# Patient Record
Sex: Female | Born: 1945 | Race: Black or African American | Hispanic: No | Smoking: Never smoker
Health system: Southern US, Community
[De-identification: ages and names within clinical notes are randomized; demographics above are authoritative.]

## PROBLEM LIST (undated history)

## (undated) DIAGNOSIS — R232 Flushing: Secondary | ICD-10-CM

## (undated) DIAGNOSIS — G5600 Carpal tunnel syndrome, unspecified upper limb: Secondary | ICD-10-CM

## (undated) DIAGNOSIS — K219 Gastro-esophageal reflux disease without esophagitis: Secondary | ICD-10-CM

## (undated) DIAGNOSIS — E119 Type 2 diabetes mellitus without complications: Secondary | ICD-10-CM

## (undated) DIAGNOSIS — C50919 Malignant neoplasm of unspecified site of unspecified female breast: Secondary | ICD-10-CM

## (undated) DIAGNOSIS — M1990 Unspecified osteoarthritis, unspecified site: Secondary | ICD-10-CM

## (undated) DIAGNOSIS — C50411 Malignant neoplasm of upper-outer quadrant of right female breast: Principal | ICD-10-CM

## (undated) HISTORY — DX: Flushing: R23.2

## (undated) HISTORY — PX: TONSILLECTOMY: SUR1361

## (undated) HISTORY — PX: EYE SURGERY: SHX253

## (undated) HISTORY — PX: ABDOMINAL HYSTERECTOMY: SHX81

## (undated) HISTORY — PX: JOINT REPLACEMENT: SHX530

## (undated) HISTORY — PX: SHOULDER SURGERY: SHX246

## (undated) HISTORY — DX: Malignant neoplasm of upper-outer quadrant of right female breast: C50.411

## (undated) HISTORY — PX: ESOPHAGOGASTRODUODENOSCOPY: SHX1529

## (undated) HISTORY — PX: APPENDECTOMY: SHX54

## (undated) HISTORY — PX: COLONOSCOPY: SHX174

---

## 2004-05-09 ENCOUNTER — Other Ambulatory Visit: Admission: RE | Admit: 2004-05-09 | Discharge: 2004-05-09 | Payer: Self-pay | Admitting: Family Medicine

## 2007-03-11 ENCOUNTER — Ambulatory Visit: Payer: Self-pay | Admitting: Pulmonary Disease

## 2007-03-11 DIAGNOSIS — E1165 Type 2 diabetes mellitus with hyperglycemia: Secondary | ICD-10-CM

## 2007-03-11 DIAGNOSIS — R05 Cough: Secondary | ICD-10-CM

## 2007-03-11 DIAGNOSIS — R059 Cough, unspecified: Secondary | ICD-10-CM | POA: Insufficient documentation

## 2007-03-27 ENCOUNTER — Telehealth (INDEPENDENT_AMBULATORY_CARE_PROVIDER_SITE_OTHER): Payer: Self-pay | Admitting: *Deleted

## 2007-04-02 ENCOUNTER — Ambulatory Visit: Payer: Self-pay | Admitting: Pulmonary Disease

## 2007-05-29 ENCOUNTER — Ambulatory Visit: Payer: Self-pay | Admitting: Pulmonary Disease

## 2007-10-23 ENCOUNTER — Emergency Department (HOSPITAL_COMMUNITY): Admission: EM | Admit: 2007-10-23 | Discharge: 2007-10-23 | Payer: Self-pay | Admitting: Emergency Medicine

## 2008-07-12 ENCOUNTER — Emergency Department (HOSPITAL_COMMUNITY): Admission: EM | Admit: 2008-07-12 | Discharge: 2008-07-12 | Payer: Self-pay | Admitting: Emergency Medicine

## 2008-09-19 ENCOUNTER — Emergency Department (HOSPITAL_COMMUNITY): Admission: EM | Admit: 2008-09-19 | Discharge: 2008-09-19 | Payer: Self-pay | Admitting: Emergency Medicine

## 2009-01-10 ENCOUNTER — Emergency Department (HOSPITAL_COMMUNITY): Admission: EM | Admit: 2009-01-10 | Discharge: 2009-01-11 | Payer: Self-pay | Admitting: Emergency Medicine

## 2009-10-24 ENCOUNTER — Emergency Department (HOSPITAL_COMMUNITY)
Admission: EM | Admit: 2009-10-24 | Discharge: 2009-10-24 | Payer: Self-pay | Source: Home / Self Care | Admitting: Family Medicine

## 2010-05-10 LAB — POCT I-STAT, CHEM 8
BUN: 11 mg/dL (ref 6–23)
Calcium, Ion: 1.09 mmol/L — ABNORMAL LOW (ref 1.12–1.32)
Creatinine, Ser: 0.8 mg/dL (ref 0.4–1.2)
Glucose, Bld: 158 mg/dL — ABNORMAL HIGH (ref 70–99)
Hemoglobin: 12.6 g/dL (ref 12.0–15.0)
Sodium: 138 mEq/L (ref 135–145)
TCO2: 27 mmol/L (ref 0–100)

## 2010-05-10 LAB — POCT CARDIAC MARKERS: Myoglobin, poc: 58.7 ng/mL (ref 12–200)

## 2010-05-10 LAB — CBC
HCT: 34.7 % — ABNORMAL LOW (ref 36.0–46.0)
MCHC: 34.2 g/dL (ref 30.0–36.0)
MCV: 93.8 fL (ref 78.0–100.0)
RBC: 3.7 MIL/uL — ABNORMAL LOW (ref 3.87–5.11)
WBC: 7.9 10*3/uL (ref 4.0–10.5)

## 2010-05-10 LAB — DIFFERENTIAL
Basophils Relative: 0 % (ref 0–1)
Eosinophils Absolute: 0.2 10*3/uL (ref 0.0–0.7)
Eosinophils Relative: 2 % (ref 0–5)
Lymphs Abs: 2.2 10*3/uL (ref 0.7–4.0)
Monocytes Absolute: 0.5 10*3/uL (ref 0.1–1.0)
Monocytes Relative: 6 % (ref 3–12)

## 2010-06-21 ENCOUNTER — Encounter (HOSPITAL_BASED_OUTPATIENT_CLINIC_OR_DEPARTMENT_OTHER)
Admission: RE | Admit: 2010-06-21 | Discharge: 2010-06-21 | Disposition: A | Discharge status: Home / Self Care | Payer: Medicare Other | Source: Ambulatory Visit | Attending: Orthopedic Surgery | Admitting: Orthopedic Surgery

## 2010-06-21 ENCOUNTER — Other Ambulatory Visit: Payer: Self-pay | Admitting: Orthopedic Surgery

## 2010-06-21 ENCOUNTER — Ambulatory Visit
Admission: RE | Admit: 2010-06-21 | Discharge: 2010-06-21 | Disposition: A | Discharge status: Home / Self Care | Payer: Medicare Other | Source: Ambulatory Visit | Attending: Orthopedic Surgery | Admitting: Orthopedic Surgery

## 2010-06-21 DIAGNOSIS — Z01811 Encounter for preprocedural respiratory examination: Secondary | ICD-10-CM

## 2010-06-21 LAB — BASIC METABOLIC PANEL
CO2: 29 mEq/L (ref 19–32)
Calcium: 10.1 mg/dL (ref 8.4–10.5)
Potassium: 3.7 mEq/L (ref 3.5–5.1)
Sodium: 140 mEq/L (ref 135–145)

## 2010-06-22 ENCOUNTER — Ambulatory Visit (HOSPITAL_BASED_OUTPATIENT_CLINIC_OR_DEPARTMENT_OTHER)
Admission: RE | Admit: 2010-06-22 | Discharge: 2010-06-22 | Disposition: A | Discharge status: Home / Self Care | Payer: Medicare Other | Source: Ambulatory Visit | Attending: Orthopedic Surgery | Admitting: Orthopedic Surgery

## 2010-06-22 DIAGNOSIS — M67919 Unspecified disorder of synovium and tendon, unspecified shoulder: Secondary | ICD-10-CM | POA: Insufficient documentation

## 2010-06-22 DIAGNOSIS — M719 Bursopathy, unspecified: Secondary | ICD-10-CM | POA: Insufficient documentation

## 2010-06-22 DIAGNOSIS — M19019 Primary osteoarthritis, unspecified shoulder: Secondary | ICD-10-CM | POA: Insufficient documentation

## 2010-06-22 DIAGNOSIS — M25819 Other specified joint disorders, unspecified shoulder: Secondary | ICD-10-CM | POA: Insufficient documentation

## 2010-06-22 DIAGNOSIS — M66329 Spontaneous rupture of flexor tendons, unspecified upper arm: Secondary | ICD-10-CM | POA: Insufficient documentation

## 2010-06-22 LAB — GLUCOSE, CAPILLARY
Glucose-Capillary: 124 mg/dL — ABNORMAL HIGH (ref 70–99)
Glucose-Capillary: 130 mg/dL — ABNORMAL HIGH (ref 70–99)

## 2010-06-22 LAB — POCT HEMOGLOBIN-HEMACUE: Hemoglobin: 12.7 g/dL (ref 12.0–15.0)

## 2010-06-28 NOTE — Op Note (Signed)
NAME:  Amanda [name], Amanda [name]                ACCOUNT NO.:  [id number]  MEDICAL RECORD NO.:  [id number]           PATIENT TYPE:  LOCATION:                                 FACILITY:  PHYSICIAN:  Harvie Junior, M.D.        DATE OF BIRTH:  DATE OF PROCEDURE:  06/22/2010 DATE OF DISCHARGE:                              OPERATIVE REPORT   PREOPERATIVE DIAGNOSES: 1. Impingement. 2. Acromioclavicular joint arthritis. 3. Question rotator cuff tear.  POSTOPERATIVE DIAGNOSES: 1. Impingement. 2. Acromioclavicular joint arthritis. 3. Biceps tendon rupture. 4. Rotator cuff tear.  PROCEDURE: 1. Arthroscopic rotator cuff repair with a FiberTape and a 5.5     SwiveLock anchor. 2. Arthroscopic acromioplasty from the lateral and posterior     compartment. 3. Arthroscopic distal clavicle resection over 20 mm. 4. Arthroscopic debridement of superior labral tear with biceps tendon     stump debridement within the glenohumeral joint as well as a     debridement of chondral fragments in the glenohumeral joint.  SURGEON:  Harvie Junior, MD  ASSISTANT:  Marshia Ly, PA  ANESTHESIA:  General.  BRIEF HISTORY:  Amanda [name] is a 65 year old female with a history of having had significant bilateral shoulder pain, right greater than left. We treated conservatively for a long period of time.  MRI was obtained which showed she had a split-thickness biceps tendon tear with significant impingement and AC joint arthritis.  We injected her a couple of times, she had good relief, talked about treatment alternatives.  We felt that most appropriate course of action for her was going to be arthroscopic subacromial decompression, distal clavicle resection, evaluation of cuff as needed.  In the interim between her MRI and her surgery, she obviously had ruptured her biceps tendon.  She was brought to the operating room for this procedure.  PROCEDURE:  The patient was brought to the operating room.  After adequate  anesthesia was obtained with general anesthetic, the patient was placed supine on the operating table.  The right shoulder was then prepped and draped in usual sterile fashion.  After she had been moved into the beach-chair position, all bony prominences had been well padded.  At this point, routine arthroscopic lamination revealed the biceps tendon had obviously been ruptured within the glenohumeral joint. The stump was visible as well as superior labral tear which was debrided back to a smooth and stable rim.  Glenohumeral joint on the anterior- inferior area had some unstable cartilage which was debrided.  The rotator cuff had an obvious frank tear in the supraspinatus tendon from the undersurface.  Once this was debrided, we sort of shaved out of this so we could find it easily from the top side.  At this point, attention was turned out of the glenohumeral joint and the subacromial space. Anterolateral acromioplasty was performed from the lateral and posterior compartment.  The distal clavicle resection was done over 20 mm from the anterior compartment.  Attention was then turned to the rotator cuff lateral side where it was clearly identified.  We put the shaver in put and put the camera  into the lateral side to define the anterior and posterior extents.  At this point, we felt that a single FiberTape would be appropriate for repair, and at this point a FiberTape anchor was passed and this was passed from the lateral cannula, taken out the front and shuttled back to the back, and then the central point between the sutures was identified and this was marked as the place for the [facility] anchor.  Once this was completed, the punch was used for the SwiveLock, and the SwiveLock was then tied lateralizing the cuff repair and bringing right down to the bone edge.  A beautiful repair of the rotator cuff was obtained with no bunching or bundling.  At this point, the wound was copiously and  thoroughly lavaged and suctioned dry.  All remaining bone fragments were removed with the suction shaver.  The camera was moved back laterally to check the rotator cuff repair and a very nice repair had been achieved from the lateral compartment.  At this point, the wound was copiously and thoroughly lavaged and suctioned dry.  The arthroscopic portals were closed with some interrupted 4-0 nylon stitches, and a sterile compressive dressing was applied.  The patient was taken to recovery room where she was noted to be in satisfactory condition.  Estimated blood loss for the procedure was none.     Harvie Junior, M.D.     Ranae Plumber  D:  06/22/2010  T:  06/22/2010  Job:  [id number]  Electronically Signed by Jodi Geralds M.D. on 06/28/2010 05:44:01 PM

## 2012-06-12 ENCOUNTER — Ambulatory Visit
Admission: RE | Admit: 2012-06-12 | Discharge: 2012-06-12 | Disposition: A | Discharge status: Home / Self Care | Payer: Medicare Other | Source: Ambulatory Visit | Attending: Family Medicine | Admitting: Family Medicine

## 2012-06-12 ENCOUNTER — Other Ambulatory Visit: Payer: Self-pay | Admitting: Family Medicine

## 2012-06-12 DIAGNOSIS — M25562 Pain in left knee: Secondary | ICD-10-CM

## 2012-06-12 DIAGNOSIS — M25561 Pain in right knee: Secondary | ICD-10-CM

## 2012-09-24 ENCOUNTER — Other Ambulatory Visit: Payer: Self-pay | Admitting: *Deleted

## 2012-09-24 MED ORDER — LIRAGLUTIDE 18 MG/3ML ~~LOC~~ SOPN: 1.8000 mg | PEN_INJECTOR | Freq: Every day | SUBCUTANEOUS | Status: DC

## 2012-10-08 ENCOUNTER — Telehealth: Payer: Self-pay | Admitting: Endocrinology

## 2012-10-08 MED ORDER — GLIPIZIDE 5 MG PO TABS: ORAL_TABLET | ORAL | Status: DC

## 2012-10-08 NOTE — Telephone Encounter (Signed)
rx sent, pt is aware 

## 2012-10-09 ENCOUNTER — Other Ambulatory Visit: Payer: Self-pay | Admitting: *Deleted

## 2012-10-09 MED ORDER — GLIPIZIDE ER 2.5 MG PO TB24: 2.5000 mg | ORAL_TABLET | Freq: Every day | ORAL | Status: DC

## 2012-10-10 ENCOUNTER — Telehealth: Payer: Self-pay | Admitting: Endocrinology

## 2012-10-10 MED ORDER — GLIPIZIDE ER 2.5 MG PO TB24: 2.5000 mg | ORAL_TABLET | Freq: Every day | ORAL | Status: DC

## 2012-10-10 NOTE — Telephone Encounter (Signed)
rx sent

## 2012-11-05 ENCOUNTER — Other Ambulatory Visit: Payer: Self-pay | Admitting: *Deleted

## 2012-11-05 DIAGNOSIS — E119 Type 2 diabetes mellitus without complications: Secondary | ICD-10-CM

## 2012-11-06 ENCOUNTER — Other Ambulatory Visit: Payer: Self-pay | Admitting: Endocrinology

## 2012-11-06 DIAGNOSIS — E785 Hyperlipidemia, unspecified: Secondary | ICD-10-CM

## 2012-11-11 ENCOUNTER — Other Ambulatory Visit: Payer: Medicare Other

## 2012-11-13 ENCOUNTER — Ambulatory Visit: Payer: Medicare Other | Admitting: Endocrinology

## 2012-11-19 ENCOUNTER — Other Ambulatory Visit (INDEPENDENT_AMBULATORY_CARE_PROVIDER_SITE_OTHER): Payer: Medicare Other

## 2012-11-19 DIAGNOSIS — E785 Hyperlipidemia, unspecified: Secondary | ICD-10-CM

## 2012-11-19 DIAGNOSIS — E119 Type 2 diabetes mellitus without complications: Secondary | ICD-10-CM

## 2012-11-19 LAB — LIPID PANEL
Cholesterol: 295 mg/dL — ABNORMAL HIGH (ref 0–200)
HDL: 63.4 mg/dL (ref >39.00)
VLDL: 27.6 mg/dL (ref 0.0–40.0)

## 2012-11-19 LAB — COMPREHENSIVE METABOLIC PANEL
ALT: 11 U/L (ref 0–35)
Alkaline Phosphatase: 43 U/L (ref 39–117)
CO2: 29 mEq/L (ref 19–32)
Creatinine, Ser: 0.7 mg/dL (ref 0.4–1.2)
GFR: 102.13 mL/min (ref >60.00)
Glucose, Bld: 113 mg/dL — ABNORMAL HIGH (ref 70–99)
Total Bilirubin: 0.5 mg/dL (ref 0.3–1.2)

## 2012-11-19 LAB — HEMOGLOBIN A1C: Hgb A1c MFr Bld: 6.7 % — ABNORMAL HIGH (ref 4.6–6.5)

## 2012-11-22 ENCOUNTER — Ambulatory Visit (INDEPENDENT_AMBULATORY_CARE_PROVIDER_SITE_OTHER): Payer: Medicare Other | Admitting: Endocrinology

## 2012-11-22 ENCOUNTER — Encounter: Payer: Self-pay | Admitting: Endocrinology

## 2012-11-22 VITALS — BP 130/62 | HR 76 | Temp 98.5°F | Resp 12 | Ht 62.0 in | Wt 195.6 lb

## 2012-11-22 DIAGNOSIS — R609 Edema, unspecified: Secondary | ICD-10-CM

## 2012-11-22 DIAGNOSIS — M1710 Unilateral primary osteoarthritis, unspecified knee: Secondary | ICD-10-CM

## 2012-11-22 DIAGNOSIS — M170 Bilateral primary osteoarthritis of knee: Secondary | ICD-10-CM

## 2012-11-22 DIAGNOSIS — IMO0002 Reserved for concepts with insufficient information to code with codable children: Secondary | ICD-10-CM

## 2012-11-22 DIAGNOSIS — R600 Localized edema: Secondary | ICD-10-CM

## 2012-11-22 DIAGNOSIS — IMO0001 Reserved for inherently not codable concepts without codable children: Secondary | ICD-10-CM

## 2012-11-22 DIAGNOSIS — E785 Hyperlipidemia, unspecified: Secondary | ICD-10-CM

## 2012-11-22 NOTE — Progress Notes (Signed)
Amanda [name] is an 67 y.o. female.   Reason for Appointment: Diabetes follow-up   History of Present Illness   Diagnosis: Type 2 DIABETES MELITUS     Previous history: She has had difficulty with blood sugar control initially because of limited tolerability with metformin and difficulty with weight loss. She did very well with starting Victoza which has helped her blood sugar control as well as some improvement in her obesity  Recent history:  Blood glucose control is overall fairly good with controlled A1c She is tolerating Victoza and is also taking low-dose glipizide without any hypoglycemia She is however checking blood sugars mostly in the mornings which seem to be fairly close to normal She can do better with weight loss with exercise but unable to do much because of osteoarthritis of knees She does not want to do water aerobics because of pain     Oral hypoglycemic drugs: Glipizide ER 2.5       Side effects from medications: Diarrhea from metformin        Monitors blood glucose: Once a day.    Glucometer:  Accu-Chek Aviva         Blood Glucose readings from meter download: readings before breakfast: 99-128, nonfasting 98-145   Hypoglycemia frequency:  none        Meals: 3 meals per day.          Physical activity: exercise: not much, knee pain            Wt Readings from Last 3 Encounters:  11/22/12 195 lb 9.6 oz (88.724 kg)  05/29/07 208 lb 4 oz (94.462 kg)  04/02/07 208 lb (94.348 kg)    LABS:  Lab on 11/19/2012  Component Date Value Range Status  . Hemoglobin A1C 11/19/2012 6.7* 4.6 - 6.5 % Final   Glycemic Control Guidelines for People with Diabetes:Non Diabetic:  <6%Goal of Therapy: <7%Additional Action Suggested:  >8%   . Sodium 11/19/2012 141  135 - 145 mEq/L Final  . Potassium 11/19/2012 4.4  3.5 - 5.1 mEq/L Final  . Chloride 11/19/2012 104  96 - 112 mEq/L Final  . CO2 11/19/2012 29  19 - 32 mEq/L Final  . Glucose, Bld 11/19/2012 113* 70 - 99 mg/dL Final  . BUN  84/13/2440 15  6 - 23 mg/dL Final  . Creatinine, Ser 11/19/2012 0.7  0.4 - 1.2 mg/dL Final  . Total Bilirubin 11/19/2012 0.5  0.3 - 1.2 mg/dL Final  . Alkaline Phosphatase 11/19/2012 43  39 - 117 U/L Final  . AST 11/19/2012 14  0 - 37 U/L Final  . ALT 11/19/2012 11  0 - 35 U/L Final  . Total Protein 11/19/2012 7.2  6.0 - 8.3 g/dL Final  . Albumin 12/03/2534 3.8  3.5 - 5.2 g/dL Final  . Calcium 64/40/3474 9.7  8.4 - 10.5 mg/dL Final  . GFR 25/95/6387 102.13  >60.00 mL/min Final  . Cholesterol 11/19/2012 295* 0 - 200 mg/dL Final   ATP III Classification       Desirable:  < 200 mg/dL               Borderline High:  200 - 239 mg/dL          High:  > = 564 mg/dL  . Triglycerides 11/19/2012 138.0  0.0 - 149.0 mg/dL Final   Normal:  <332 mg/dLBorderline High:  150 - 199 mg/dL  . HDL 11/19/2012 63.40  >39.00 mg/dL Final  . VLDL 95/18/8416 27.6  0.0 -  40.0 mg/dL Final  . Total CHOL/HDL Ratio 11/19/2012 5   Final                  Men          Women1/2 Average Risk     3.4          3.3Average Risk          5.0          4.42X Average Risk          9.6          7.13X Average Risk          15.0          11.0                      . Direct LDL 11/19/2012 204.9   Final   Optimal:  <100 mg/dLNear or Above Optimal:  100-129 mg/dLBorderline High:  130-159 mg/dLHigh:  160-189 mg/dLVery High:  >190 mg/dL      Medication List       This list is accurate as of: 11/22/12 10:02 AM.  Always use your most recent med list.               glipiZIDE 2.5 MG 24 hr tablet  Commonly known as:  GLIPIZIDE XL  Take 1 tablet (2.5 mg total) by mouth daily.     Liraglutide 18 MG/3ML Sopn  Inject 1.2 mg into the skin daily. Does 1.2 plus five clicks        Allergies:  Allergies  Allergen Reactions  . Meperidine Hcl     REACTION: rash    No past medical history on file.  No past surgical history on file.  No family history on file.  Social History:  reports that she has never smoked. She has never used  smokeless tobacco. Her alcohol and drug histories are not on file.  Review of Systems:  No history of hypertension. Renal function is normal also  Lipids:  She was told to stop her Crestor by PCP several months ago, not clear of the reason that his previous records are not available. Does not think it was causing muscle aches Her LDL is over 200 now Liver functions are normal    She is still complaining of knee joint pain which is persistent even at rest, not considering surgery at  She thinks she has had swelling in her feet in the evenings  Occasional tingling but no numbness in her feet from neuropathy   Examination:  No ankle edema present Foot exam done   BP 130/62  Pulse 76  Temp(Src) 98.5 F (36.9 C)  Resp 12  Ht 5\' 2"  (1.575 m)  Wt 195 lb 9.6 oz (88.724 kg)  BMI 35.77 kg/m2  SpO2 97%  Body mass index is 35.77 kg/(m^2).    ASSESSMENT/ PLAN::   Diabetes type 2   Blood glucose control is overall fairly good with minimal increase in A1c She has done very well with taking Victoza which has helped her blood sugar control as well as some improvement in her obesity Also has no hypoglycemia with minimal doses of glipizide ER She can do better with weight loss with exercise but unable to do much because of osteoarthritis of knees She does not want to do water aerobics because of pain Encouraged her to do more arm exercises which she knows how to do that  Also needs to do more  readings after meals especially supper to help guide her diet  Hyperlipidemia: She will discuss treatment with PCP. If there was an adverse effect of Crestor she should be on an alternative agent Discussed that she will need to be on a statin drug because of marked hypercholesterolemia Discussed low saturated fat diet, she can do better with cutting back on meats  Foot exam normal, no evidence of neuropathy  Amanda [name] 11/22/2012, 10:02 AM

## 2012-11-22 NOTE — Patient Instructions (Addendum)
Check more sugars 2 hours after supper, adjust diet if blood sugars are higher with certain foods  Chair exercises daily  To discuss restarting Crestor with primary care physician

## 2012-11-26 ENCOUNTER — Encounter: Payer: Self-pay | Admitting: Endocrinology

## 2012-11-26 DIAGNOSIS — M170 Bilateral primary osteoarthritis of knee: Secondary | ICD-10-CM | POA: Insufficient documentation

## 2012-11-26 DIAGNOSIS — E785 Hyperlipidemia, unspecified: Secondary | ICD-10-CM | POA: Insufficient documentation

## 2013-02-18 ENCOUNTER — Other Ambulatory Visit: Payer: Self-pay | Admitting: *Deleted

## 2013-02-18 MED ORDER — INSULIN PEN NEEDLE 32G X 4 MM MISC: Status: DC

## 2013-02-24 ENCOUNTER — Other Ambulatory Visit: Payer: Self-pay | Admitting: *Deleted

## 2013-03-24 ENCOUNTER — Other Ambulatory Visit (INDEPENDENT_AMBULATORY_CARE_PROVIDER_SITE_OTHER): Payer: Medicare Other

## 2013-03-24 DIAGNOSIS — E785 Hyperlipidemia, unspecified: Secondary | ICD-10-CM

## 2013-03-24 DIAGNOSIS — IMO0001 Reserved for inherently not codable concepts without codable children: Secondary | ICD-10-CM

## 2013-03-24 DIAGNOSIS — E1165 Type 2 diabetes mellitus with hyperglycemia: Principal | ICD-10-CM

## 2013-03-24 LAB — COMPREHENSIVE METABOLIC PANEL WITH GFR
ALT: 16 U/L (ref 0–35)
AST: 14 U/L (ref 0–37)
Albumin: 3.8 g/dL (ref 3.5–5.2)
Alkaline Phosphatase: 38 U/L — ABNORMAL LOW (ref 39–117)
BUN: 19 mg/dL (ref 6–23)
CO2: 28 meq/L (ref 19–32)
Calcium: 9.6 mg/dL (ref 8.4–10.5)
Chloride: 103 meq/L (ref 96–112)
Creatinine, Ser: 0.8 mg/dL (ref 0.4–1.2)
GFR: 93.14 mL/min
Glucose, Bld: 108 mg/dL — ABNORMAL HIGH (ref 70–99)
Potassium: 4.5 meq/L (ref 3.5–5.1)
Sodium: 139 meq/L (ref 135–145)
Total Bilirubin: 0.5 mg/dL (ref 0.3–1.2)
Total Protein: 7.2 g/dL (ref 6.0–8.3)

## 2013-03-24 LAB — LIPID PANEL
Cholesterol: 206 mg/dL — ABNORMAL HIGH (ref 0–200)
HDL: 67 mg/dL
Total CHOL/HDL Ratio: 3
Triglycerides: 152 mg/dL — ABNORMAL HIGH (ref 0.0–149.0)
VLDL: 30.4 mg/dL (ref 0.0–40.0)

## 2013-03-24 LAB — LDL CHOLESTEROL, DIRECT: Direct LDL: 115.5 mg/dL

## 2013-03-24 LAB — HEMOGLOBIN A1C: Hgb A1c MFr Bld: 7 % — ABNORMAL HIGH (ref 4.6–6.5)

## 2013-04-01 ENCOUNTER — Encounter: Payer: Self-pay | Admitting: Endocrinology

## 2013-04-01 ENCOUNTER — Ambulatory Visit (INDEPENDENT_AMBULATORY_CARE_PROVIDER_SITE_OTHER): Payer: Medicare Other | Admitting: Endocrinology

## 2013-04-01 VITALS — BP 134/82 | HR 79 | Temp 98.5°F | Resp 14 | Ht 62.0 in | Wt 192.4 lb

## 2013-04-01 DIAGNOSIS — IMO0001 Reserved for inherently not codable concepts without codable children: Secondary | ICD-10-CM

## 2013-04-01 DIAGNOSIS — E785 Hyperlipidemia, unspecified: Secondary | ICD-10-CM

## 2013-04-01 DIAGNOSIS — E1165 Type 2 diabetes mellitus with hyperglycemia: Principal | ICD-10-CM

## 2013-04-01 NOTE — Progress Notes (Signed)
Amanda [name] is an 68 y.o. female.    Reason for Appointment: Diabetes follow-up   History of Present Illness   Diagnosis: Type 2 DIABETES MELITUS     Previous history: She has had difficulty with blood sugar control initially because of limited tolerability with metformin and difficulty with weight loss. She did very well with starting Victoza which has helped her blood sugar control as well as some improvement in her obesity  Recent history:  Blood glucose control is overall fairly good with A1c around 7%; however this is slightly higher than before She is tolerating Victoza and is also taking low-dose glipizide ER without any hypoglycemia She has started checking some nonfasting readings but only midday are after lunch Appears to have relatively higher readings on waking up She did have high readings for a couple of days after getting a steroid injection in her knee but not clear why her A1c slightly higher She has been able to keep her weight down with diet and help with Victoza is She does not want to do water aerobics because of knee joint pain     Oral hypoglycemic drugs: Glipizide ER 2.5       Side effects from medications: Diarrhea from metformin        Monitors blood glucose: Once a day.    Glucometer:  Accu-Chek Aviva         Blood Glucose readings from meter download: readings before breakfast: 137-149; late morning 108-253, midday and afternoon 116-165   Hypoglycemia frequency:  none                Physical activity: exercise: not much, knee pain. She thinks she is doing some arm exercises            Wt Readings from Last 3 Encounters:  04/01/13 192 lb 6.4 oz (87.272 kg)  11/22/12 195 lb 9.6 oz (88.724 kg)  05/29/07 208 lb 4 oz (94.462 kg)    LABS:  Lab Results  Component Value Date   HGBA1C 7.0* 03/24/2013   HGBA1C 6.7* 11/19/2012   Lab Results  Component Value Date   CREATININE 0.8 03/24/2013     No visits with results within 1 Week(s) from this  visit. Latest known visit with results is:  Appointment on 03/24/2013  Component Date Value Ref Range Status  . Hemoglobin A1C 03/24/2013 7.0* 4.6 - 6.5 % Final   Glycemic Control Guidelines for People with Diabetes:Non Diabetic:  <6%Goal of Therapy: <7%Additional Action Suggested:  >8%   . Sodium 03/24/2013 139  135 - 145 mEq/L Final  . Potassium 03/24/2013 4.5  3.5 - 5.1 mEq/L Final  . Chloride 03/24/2013 103  96 - 112 mEq/L Final  . CO2 03/24/2013 28  19 - 32 mEq/L Final  . Glucose, Bld 03/24/2013 108* 70 - 99 mg/dL Final  . BUN 03/24/2013 19  6 - 23 mg/dL Final  . Creatinine, Ser 03/24/2013 0.8  0.4 - 1.2 mg/dL Final  . Total Bilirubin 03/24/2013 0.5  0.3 - 1.2 mg/dL Final  . Alkaline Phosphatase 03/24/2013 38* 39 - 117 U/L Final  . AST 03/24/2013 14  0 - 37 U/L Final  . ALT 03/24/2013 16  0 - 35 U/L Final  . Total Protein 03/24/2013 7.2  6.0 - 8.3 g/dL Final  . Albumin 03/24/2013 3.8  3.5 - 5.2 g/dL Final  . Calcium 03/24/2013 9.6  8.4 - 10.5 mg/dL Final  . GFR 03/24/2013 93.14  >60.00 mL/min Final  . Cholesterol  03/24/2013 206* 0 - 200 mg/dL Final   ATP III Classification       Desirable:  < 200 mg/dL               Borderline High:  200 - 239 mg/dL          High:  > = 240 mg/dL  . Triglycerides 03/24/2013 152.0* 0.0 - 149.0 mg/dL Final   Normal:  <150 mg/dLBorderline High:  150 - 199 mg/dL  . HDL 03/24/2013 67.00  >39.00 mg/dL Final  . VLDL 03/24/2013 30.4  0.0 - 40.0 mg/dL Final  . Total CHOL/HDL Ratio 03/24/2013 3   Final                  Men          Women1/2 Average Risk     3.4          3.3Average Risk          5.0          4.42X Average Risk          9.6          7.13X Average Risk          15.0          11.0                      . Direct LDL 03/24/2013 115.5   Final   Optimal:  <100 mg/dLNear or Above Optimal:  100-129 mg/dLBorderline High:  130-159 mg/dLHigh:  160-189 mg/dLVery High:  >190 mg/dL      Medication List       This list is accurate as of: 04/01/13 11:59  PM.  Always use your most recent med list.               aspirin 81 MG tablet  Take 81 mg by mouth daily.     estrogens (conjugated) 0.625 MG tablet  Commonly known as:  PREMARIN  Take 0.625 mg by mouth daily. Take daily for 21 days then do not take for 7 days.     glipiZIDE 2.5 MG 24 hr tablet  Commonly known as:  GLIPIZIDE XL  Take 1 tablet (2.5 mg total) by mouth daily.     Insulin Pen Needle 32G X 4 MM Misc  Commonly known as:  BD PEN NEEDLE NANO U/F  Use as directed     Liraglutide 18 MG/3ML Sopn  Inject 1.2 mg into the skin daily. Does 1.2 plus five clicks     MULTIPLE VITAMINS/IRON PO  Take by mouth.     rosuvastatin 10 MG tablet  Commonly known as:  CRESTOR  Take 10 mg by mouth daily.        Allergies:  Allergies  Allergen Reactions  . Meperidine Hcl     REACTION: rash    No past medical history on file.  No past surgical history on file.  No family history on file.  Social History:  reports that she has never smoked. She has never used smokeless tobacco. Her alcohol and drug histories are not on file.  Review of Systems:  No history of hypertension. Renal function is normal also  Lipids:  She in on Crestor from PCP. She thinks she has had joint pains from Crestor and also Lipitor. She is now able to take this about 2-3 times a week with some improvement in her LDL, baseline LDL had been over 200  Liver functions are normal    She is still complaining of knee joint pain which is persistent even at rest, not considering surgery at present  Occasional tingling but no numbness in her feet from neuropathy  Foot exam done in 10/14   Examination:  No ankle edema present    BP 134/82  Pulse 79  Temp(Src) 98.5 F (36.9 C)  Resp 14  Ht 5\' 2"  (1.575 m)  Wt 192 lb 6.4 oz (87.272 kg)  BMI 35.18 kg/m2  SpO2 97%  Body mass index is 35.18 kg/(m^2).    ASSESSMENT/ PLAN::   Diabetes type 2   Blood glucose control is overall fairly good with  mild increase in A1c from before However she has been able to keep her weight down She has done  well with the help of Victoza which has helped her blood sugar control as well as some improvement in her obesity Also has no hypoglycemia with minimal doses of glipizide ER She can do better with weight loss with exercise but unable to do much because of osteoarthritis of knees  Again discussed possibility of doing water aerobics but she is reluctant to do this Also needs to do more readings after meals especially supper to help guide her diet  Hyperlipidemia: She will continue Crestor 3 times a week, LDL is significantly better than baseline of 205   Amanda [name] 04/02/2013, 9:19 AM

## 2013-04-01 NOTE — Patient Instructions (Addendum)
Please check blood sugars at least half the time about 2 hours after any meal and as directed on waking up. Please bring blood sugar monitor to each visit  Chair exercises daily

## 2013-04-29 ENCOUNTER — Telehealth: Payer: Self-pay

## 2013-04-29 ENCOUNTER — Other Ambulatory Visit: Payer: Self-pay | Admitting: Endocrinology

## 2013-04-29 MED ORDER — LIRAGLUTIDE 18 MG/3ML ~~LOC~~ SOPN: 1.2000 mg | PEN_INJECTOR | Freq: Every day | SUBCUTANEOUS | Status: DC

## 2013-04-29 NOTE — Telephone Encounter (Signed)
The patient called and is hoping to get a refill of the victoza (spelling?) medication   Callback - [phone]  (i apologize if this is a duplicate message)

## 2013-05-05 ENCOUNTER — Other Ambulatory Visit: Payer: Self-pay | Admitting: *Deleted

## 2013-05-05 ENCOUNTER — Telehealth: Payer: Self-pay | Admitting: Endocrinology

## 2013-05-05 NOTE — Telephone Encounter (Signed)
That rx was sent on 3/24

## 2013-05-05 NOTE — Telephone Encounter (Signed)
Dr [facility] Victoza 3 month supply

## 2013-05-06 ENCOUNTER — Telehealth: Payer: Self-pay | Admitting: *Deleted

## 2013-05-06 NOTE — Telephone Encounter (Signed)
Trulicity 1.5 mg weekly, if she wants instructions on the pen will need to schedule with Vaughan Basta

## 2013-05-06 NOTE — Telephone Encounter (Signed)
Pharmacy called, Amanda [name] is not preferred, patients insurance plan covers either Trulicity, Byetta or Bydureon.  Please advise

## 2013-05-13 ENCOUNTER — Institutional Professional Consult (permissible substitution): Payer: Medicare Other

## 2013-05-29 ENCOUNTER — Telehealth: Payer: Self-pay | Admitting: Endocrinology

## 2013-05-29 ENCOUNTER — Other Ambulatory Visit: Payer: Self-pay | Admitting: *Deleted

## 2013-05-29 ENCOUNTER — Telehealth: Payer: Self-pay | Admitting: *Deleted

## 2013-05-29 NOTE — Telephone Encounter (Signed)
She can either go back to Victoza or try the 9.19 mg Trulicity dosage. If going back to Victoza lab to start with 0.6 mg and increase to 1.2 mg after 5 days

## 2013-05-29 NOTE — Telephone Encounter (Signed)
Pt wants to let us know she is very nauseous is there anything she can do she feels it is from the shots

## 2013-05-29 NOTE — Telephone Encounter (Signed)
Patient called, she said she started on the Trulicity the first of the month, she said somewhere between the 2nd and 3rd injection she started feeling very nauseous, no fever, no vomiting, but feeling like she needs to vomit.  She said the feeling doesn't go away.  Wants to know what she should do?

## 2013-07-01 ENCOUNTER — Other Ambulatory Visit (INDEPENDENT_AMBULATORY_CARE_PROVIDER_SITE_OTHER): Payer: Medicare Other

## 2013-07-01 ENCOUNTER — Other Ambulatory Visit: Payer: Self-pay | Admitting: *Deleted

## 2013-07-01 DIAGNOSIS — E1165 Type 2 diabetes mellitus with hyperglycemia: Principal | ICD-10-CM

## 2013-07-01 DIAGNOSIS — IMO0001 Reserved for inherently not codable concepts without codable children: Secondary | ICD-10-CM

## 2013-07-01 LAB — URINALYSIS, ROUTINE W REFLEX MICROSCOPIC
Bilirubin Urine: NEGATIVE
Hgb urine dipstick: NEGATIVE
Ketones, ur: NEGATIVE
LEUKOCYTES UA: NEGATIVE
Nitrite: NEGATIVE
RBC / HPF: NONE SEEN (ref 0–?)
TOTAL PROTEIN, URINE-UPE24: NEGATIVE
URINE GLUCOSE: NEGATIVE
Urobilinogen, UA: 0.2 (ref 0.0–1.0)
pH: 5.5 (ref 5.0–8.0)

## 2013-07-01 LAB — COMPREHENSIVE METABOLIC PANEL
ALBUMIN: 3.6 g/dL (ref 3.5–5.2)
ALT: 14 U/L (ref 0–35)
AST: 17 U/L (ref 0–37)
Alkaline Phosphatase: 40 U/L (ref 39–117)
BILIRUBIN TOTAL: 0.5 mg/dL (ref 0.2–1.2)
BUN: 13 mg/dL (ref 6–23)
CHLORIDE: 106 meq/L (ref 96–112)
CO2: 25 meq/L (ref 19–32)
Calcium: 9.5 mg/dL (ref 8.4–10.5)
Creatinine, Ser: 0.7 mg/dL (ref 0.4–1.2)
GFR: 107 mL/min (ref >60.00)
GLUCOSE: 123 mg/dL — AB (ref 70–99)
POTASSIUM: 3.7 meq/L (ref 3.5–5.1)
SODIUM: 139 meq/L (ref 135–145)
TOTAL PROTEIN: 7 g/dL (ref 6.0–8.3)

## 2013-07-01 LAB — MICROALBUMIN / CREATININE URINE RATIO
Creatinine,U: 22.6 mg/dL
MICROALB UR: 0.6 mg/dL (ref 0.0–1.9)
Microalb Creat Ratio: 2.7 mg/g (ref 0.0–30.0)

## 2013-07-01 LAB — HEMOGLOBIN A1C: HEMOGLOBIN A1C: 7.2 % — AB (ref 4.6–6.5)

## 2013-07-01 MED ORDER — GLUCOSE BLOOD VI STRP: ORAL_STRIP | Status: DC

## 2013-07-03 ENCOUNTER — Ambulatory Visit (INDEPENDENT_AMBULATORY_CARE_PROVIDER_SITE_OTHER): Payer: Medicare Other | Admitting: Endocrinology

## 2013-07-03 ENCOUNTER — Encounter: Payer: Self-pay | Admitting: Endocrinology

## 2013-07-03 VITALS — BP 120/74 | HR 75 | Temp 98.3°F | Resp 14 | Ht 62.0 in | Wt 193.0 lb

## 2013-07-03 DIAGNOSIS — IMO0001 Reserved for inherently not codable concepts without codable children: Secondary | ICD-10-CM

## 2013-07-03 DIAGNOSIS — E785 Hyperlipidemia, unspecified: Secondary | ICD-10-CM

## 2013-07-03 DIAGNOSIS — E1165 Type 2 diabetes mellitus with hyperglycemia: Principal | ICD-10-CM

## 2013-07-03 NOTE — Progress Notes (Signed)
Amanda [name] is an 68 y.o. female.    Reason for Appointment: Diabetes follow-up   History of Present Illness   Diagnosis: Type 2 DIABETES MELITUS     Previous history: She has had difficulty with blood sugar control initially because of limited tolerability with metformin and difficulty with weight loss. She did very well with starting Victoza which has helped her blood sugar control as well as some improvement in her obesity  Recent history:   Her insurance company did not allow her to get Victoza and she was switched to Trulicity This was done in 4/15 but she was having nausea with 1.5 mg Trulicity is not taking 0.75 for at least 4 weeks Her blood sugars overall appear to be slightly higher as judged by her A1c although did not have consistently high readings in the monitor download She does not know if her blood sugars were much higher with taking a steroid injection in early May She has been checking some nonfasting readings in the evenings Appears to have relatively higher readings on waking up as before Still not able to exercise Also recently because of family issues she has not watched her diet or checked her sugar regularly     Oral hypoglycemic drugs: Glipizide ER 2.5       Side effects from medications: Diarrhea from metformin        Monitors blood glucose: Once a day.    Glucometer:  Accu-Chek Aviva         Blood Glucose readings from meter download:   PREMEAL Breakfast Lunch Dinner  PCS  Overall  Glucose range:   125, 141    127, 149   110-181    Mean/median:     140  138     Hypoglycemia frequency:  none                Physical activity: exercise: not much because of knee pain. She thinks she is doing some arm exercises            Wt Readings from Last 3 Encounters:  07/03/13 193 lb (87.544 kg)  04/01/13 192 lb 6.4 oz (87.272 kg)  11/22/12 195 lb 9.6 oz (88.724 kg)    LABS:  Lab Results  Component Value Date   HGBA1C 7.2* 07/01/2013   HGBA1C 7.0* 03/24/2013    HGBA1C 6.7* 11/19/2012   Lab Results  Component Value Date   MICROALBUR 0.6 07/01/2013   CREATININE 0.7 07/01/2013     Appointment on 07/01/2013  Component Date Value Ref Range Status  . Hemoglobin A1C 07/01/2013 7.2* 4.6 - 6.5 % Final   Glycemic Control Guidelines for People with Diabetes:Non Diabetic:  <6%Goal of Therapy: <7%Additional Action Suggested:  >8%   . Sodium 07/01/2013 139  135 - 145 mEq/L Final  . Potassium 07/01/2013 3.7  3.5 - 5.1 mEq/L Final  . Chloride 07/01/2013 106  96 - 112 mEq/L Final  . CO2 07/01/2013 25  19 - 32 mEq/L Final  . Glucose, Bld 07/01/2013 123* 70 - 99 mg/dL Final  . BUN 07/01/2013 13  6 - 23 mg/dL Final  . Creatinine, Ser 07/01/2013 0.7  0.4 - 1.2 mg/dL Final  . Total Bilirubin 07/01/2013 0.5  0.2 - 1.2 mg/dL Final  . Alkaline Phosphatase 07/01/2013 40  39 - 117 U/L Final  . AST 07/01/2013 17  0 - 37 U/L Final  . ALT 07/01/2013 14  0 - 35 U/L Final  . Total Protein 07/01/2013 7.0  6.0 - 8.3 g/dL Final  . Albumin 07/01/2013 3.6  3.5 - 5.2 g/dL Final  . Calcium 07/01/2013 9.5  8.4 - 10.5 mg/dL Final  . GFR 07/01/2013 107.00  >60.00 mL/min Final  . Microalb, Ur 07/01/2013 0.6  0.0 - 1.9 mg/dL Final  . Creatinine,U 07/01/2013 22.6   Final  . Microalb Creat Ratio 07/01/2013 2.7  0.0 - 30.0 mg/g Final  . Color, Urine 07/01/2013 YELLOW  Yellow;Lt. Yellow Final  . APPearance 07/01/2013 CLEAR  Clear Final  . Specific Gravity, Urine 07/01/2013 <=1.005* 1.000 - 1.030 Final  . pH 07/01/2013 5.5  5.0 - 8.0 Final  . Total Protein, Urine 07/01/2013 NEGATIVE  Negative Final  . Urine Glucose 07/01/2013 NEGATIVE  Negative Final  . Ketones, ur 07/01/2013 NEGATIVE  Negative Final  . Bilirubin Urine 07/01/2013 NEGATIVE  Negative Final  . Hgb urine dipstick 07/01/2013 NEGATIVE  Negative Final  . Urobilinogen, UA 07/01/2013 0.2  0.0 - 1.0 Final  . Leukocytes, UA 07/01/2013 NEGATIVE  Negative Final  . Nitrite 07/01/2013 NEGATIVE  Negative Final  . WBC, UA  07/01/2013 0-2/hpf  0-2/hpf Final  . RBC / HPF 07/01/2013 none seen  0-2/hpf Final  . Squamous Epithelial / LPF 07/01/2013 Rare(0-4/hpf)  Rare(0-4/hpf) Final      Medication List       This list is accurate as of: 07/03/13  9:11 AM.  Always use your most recent med list.               aspirin 81 MG tablet  Take 81 mg by mouth daily.     estrogens (conjugated) 0.625 MG tablet  Commonly known as:  PREMARIN  Take 0.625 mg by mouth daily. Take daily for 21 days then do not take for 7 days.     GLIPIZIDE XL 2.5 MG 24 hr tablet  Generic drug:  glipiZIDE  TAKE 1 TABLET DAILY     glucose blood test strip  Commonly known as:  ACCU-CHEK AVIVA PLUS  Use as instructed to check blood sugar once a day     Insulin Pen Needle 32G X 4 MM Misc  Commonly known as:  BD PEN NEEDLE NANO U/F  Use as directed     Liraglutide 18 MG/3ML Sopn  Inject 1.2 mg into the skin daily. Does 1.2 plus five clicks     MULTIPLE VITAMINS/IRON PO  Take by mouth.     rosuvastatin 10 MG tablet  Commonly known as:  CRESTOR  Take 10 mg by mouth daily.     TRULICITY 7.82 NF/6.2ZH Sopn  Generic drug:  Dulaglutide  Inject 0.75 mg into the skin.        Allergies:  Allergies  Allergen Reactions  . Meperidine Hcl     REACTION: rash    No past medical history on file.  No past surgical history on file.  No family history on file.  Social History:  reports that she has never smoked. She has never used smokeless tobacco. Her alcohol and drug histories are not on file.  Review of Systems:  No history of hypertension. Renal function is normal   Lipids:  She in on Crestor from PCP. She thinks she has had joint pains from Crestor and also Lipitor. She is  able to take this about 2-3 times a week with some improvement in her LDL, baseline LDL had been over 200    She is still complaining of knee joint pain which is persistent even at rest. Is going  to have surgery  Foot exam done in 10/14    Examination:     BP 120/74  Pulse 75  Temp(Src) 98.3 F (36.8 C)  Resp 14  Ht 5\' 2"  (1.575 m)  Wt 193 lb (87.544 kg)  BMI 35.29 kg/m2  SpO2 94%  Body mass index is 35.29 kg/(m^2).    ASSESSMENT/ PLAN:  Diabetes type 2   Blood glucose control is overall fairly good with mild increase in A1c from before This is probably because of recent inconsistent diet, occasional steroid injections and using a lower dose of Trulicity compared to the 1.2 mg Victoza she was using However she has been able to keep her weight down Also has no hypoglycemia with the lowest dose of glipizide ER  Since she had nausea with 1.5 mg of Trulicity will not try this as yet Hopefully she will do her exercise after her knee replacement She will call if blood sugars are consistently high, given target readings   [name] 07/03/2013, 9:11 AM

## 2013-07-03 NOTE — Patient Instructions (Signed)
Call if am sugars stay over 140 or after meals over 180+

## 2013-07-08 ENCOUNTER — Encounter (HOSPITAL_COMMUNITY): Payer: Self-pay

## 2013-07-08 ENCOUNTER — Other Ambulatory Visit: Payer: Self-pay | Admitting: Orthopedic Surgery

## 2013-07-09 NOTE — Pre-Procedure Instructions (Signed)
Amanda [name]  07/09/2013   Your procedure is scheduled on:  Friday, July 18, 2013.  Report to [facility] Entrance "A" [address] at [employer] AM.  Call this number if you have problems the morning of surgery: [phone]   Remember:   Do not eat food or drink liquids after midnight.   Take these medicines the morning of surgery with A SIP OF WATER: fexofenadine (Allegra)  STOP taking Aspirin, Goody's, BC's, Aleve (Naproxen), Ibuprofen (Advil or Motrin), Fish Oil, Vitamins, Herbal Supplements or any substance that could thin your blood starting Friday, July 11, 2013.  Do not take any of your diabetic medications day of surgery. Continue to take all your other medicines as you normally do until day of surgery and then follow above instructions.   Do not wear jewelry, make-up or nail polish.  Do not wear lotions, powders, or perfumes. You may wear deodorant.  Do not shave 48 hours prior to surgery.  Do not bring valuables to the hospital.  [facility] is not responsible                  for any belongings or valuables.               Contacts, dentures or bridgework may not be worn into surgery.  Leave suitcase in the car. After surgery it may be brought to your room.  For patients admitted to the hospital, discharge time is determined by your                treatment team.             Special Instructions: Please use CHG soap the night before surgery and the day of surgery. CHG soap should be used atleast twice.   Please read over the following fact sheets that you were given: Pain Booklet, Coughing and Deep Breathing, Blood Transfusion Information, Total Joint Packet, MRSA Information and Surgical Site Infection Prevention.

## 2013-07-10 ENCOUNTER — Encounter (HOSPITAL_COMMUNITY)
Admission: RE | Admit: 2013-07-10 | Discharge: 2013-07-10 | Disposition: A | Discharge status: Home / Self Care | Payer: Medicare Other | Source: Ambulatory Visit | Attending: Orthopedic Surgery | Admitting: Orthopedic Surgery

## 2013-07-10 ENCOUNTER — Encounter (HOSPITAL_COMMUNITY): Payer: Self-pay

## 2013-07-10 DIAGNOSIS — Z01810 Encounter for preprocedural cardiovascular examination: Secondary | ICD-10-CM | POA: Insufficient documentation

## 2013-07-10 DIAGNOSIS — Z01818 Encounter for other preprocedural examination: Secondary | ICD-10-CM | POA: Insufficient documentation

## 2013-07-10 DIAGNOSIS — Z01812 Encounter for preprocedural laboratory examination: Secondary | ICD-10-CM | POA: Insufficient documentation

## 2013-07-10 HISTORY — DX: Unspecified osteoarthritis, unspecified site: M19.90

## 2013-07-10 HISTORY — DX: Carpal tunnel syndrome, unspecified upper limb: G56.00

## 2013-07-10 HISTORY — DX: Type 2 diabetes mellitus without complications: E11.9

## 2013-07-10 HISTORY — DX: Gastro-esophageal reflux disease without esophagitis: K21.9

## 2013-07-10 LAB — URINALYSIS, ROUTINE W REFLEX MICROSCOPIC
Bilirubin Urine: NEGATIVE
GLUCOSE, UA: NEGATIVE mg/dL
HGB URINE DIPSTICK: NEGATIVE
Ketones, ur: NEGATIVE mg/dL
Leukocytes, UA: NEGATIVE
Nitrite: NEGATIVE
PROTEIN: NEGATIVE mg/dL
Specific Gravity, Urine: 1.018 (ref 1.005–1.030)
Urobilinogen, UA: 0.2 mg/dL (ref 0.0–1.0)
pH: 5.5 (ref 5.0–8.0)

## 2013-07-10 LAB — CBC WITH DIFFERENTIAL/PLATELET
Basophils Absolute: 0 10*3/uL (ref 0.0–0.1)
Basophils Relative: 0 % (ref 0–1)
Eosinophils Absolute: 0.2 10*3/uL (ref 0.0–0.7)
Eosinophils Relative: 2 % (ref 0–5)
HEMATOCRIT: 39.8 % (ref 36.0–46.0)
Hemoglobin: 13.1 g/dL (ref 12.0–15.0)
Lymphocytes Relative: 53 % — ABNORMAL HIGH (ref 12–46)
Lymphs Abs: 5 10*3/uL — ABNORMAL HIGH (ref 0.7–4.0)
MCH: 31 pg (ref 26.0–34.0)
MCHC: 32.9 g/dL (ref 30.0–36.0)
MCV: 94.3 fL (ref 78.0–100.0)
MONO ABS: 0.7 10*3/uL (ref 0.1–1.0)
Monocytes Relative: 8 % (ref 3–12)
Neutro Abs: 3.5 10*3/uL (ref 1.7–7.7)
Neutrophils Relative %: 37 % — ABNORMAL LOW (ref 43–77)
Platelets: 403 10*3/uL — ABNORMAL HIGH (ref 150–400)
RBC: 4.22 MIL/uL (ref 3.87–5.11)
RDW: 14.9 % (ref 11.5–15.5)
WBC: 9.5 10*3/uL (ref 4.0–10.5)

## 2013-07-10 LAB — COMPREHENSIVE METABOLIC PANEL
ALT: 12 U/L (ref 0–35)
AST: 16 U/L (ref 0–37)
Albumin: 4 g/dL (ref 3.5–5.2)
Alkaline Phosphatase: 54 U/L (ref 39–117)
BUN: 13 mg/dL (ref 6–23)
CO2: 28 meq/L (ref 19–32)
CREATININE: 0.64 mg/dL (ref 0.50–1.10)
Calcium: 10.3 mg/dL (ref 8.4–10.5)
Chloride: 99 mEq/L (ref 96–112)
GFR calc non Af Amer: 90 mL/min — ABNORMAL LOW (ref >90)
GLUCOSE: 99 mg/dL (ref 70–99)
Potassium: 4.1 mEq/L (ref 3.7–5.3)
Sodium: 140 mEq/L (ref 137–147)
Total Bilirubin: 0.3 mg/dL (ref 0.3–1.2)
Total Protein: 7.8 g/dL (ref 6.0–8.3)

## 2013-07-10 LAB — ABO/RH: ABO/RH(D): A POS

## 2013-07-10 LAB — SURGICAL PCR SCREEN
MRSA, PCR: NEGATIVE
Staphylococcus aureus: NEGATIVE

## 2013-07-10 LAB — TYPE AND SCREEN
ABO/RH(D): A POS
Antibody Screen: NEGATIVE

## 2013-07-10 LAB — APTT: aPTT: 30 seconds (ref 24–37)

## 2013-07-10 LAB — PROTIME-INR
INR: 0.92 (ref 0.00–1.49)
Prothrombin Time: 12.2 seconds (ref 11.6–15.2)

## 2013-07-10 NOTE — Progress Notes (Signed)
Pt reports having a sleep study done about 2 years ago but not sure where. Pt reports that study must have been negative because she was not contacted regarding CPAP or follow-up. This nurse will fax request to PCP, Dr. Antony Contras, with [facility] [phone]) to see if they have report.

## 2013-07-17 MED ORDER — CEFAZOLIN SODIUM-DEXTROSE 2-3 GM-% IV SOLR
2.0000 g | INTRAVENOUS | Status: AC
  Administered 2013-07-18: 2 g via INTRAVENOUS
  Filled 2013-07-17: qty 50

## 2013-07-17 MED ORDER — CHLORHEXIDINE GLUCONATE 4 % EX LIQD
60.0000 mL | Freq: Once | CUTANEOUS | Status: DC
  Filled 2013-07-17: qty 60

## 2013-07-18 ENCOUNTER — Inpatient Hospital Stay (HOSPITAL_COMMUNITY)
Admission: RE | Admit: 2013-07-18 | Discharge: 2013-07-20 | DRG: 470 | Disposition: A | Discharge status: Home Health | Payer: Medicare Other | Source: Ambulatory Visit | Attending: Orthopedic Surgery | Admitting: Orthopedic Surgery

## 2013-07-18 ENCOUNTER — Encounter (HOSPITAL_COMMUNITY)
Admission: RE | Disposition: A | Discharge status: Home Health | Payer: Self-pay | Source: Ambulatory Visit | Attending: Orthopedic Surgery

## 2013-07-18 ENCOUNTER — Encounter (HOSPITAL_COMMUNITY): Payer: Medicare Other | Admitting: Anesthesiology

## 2013-07-18 ENCOUNTER — Encounter (HOSPITAL_COMMUNITY): Payer: Self-pay | Admitting: *Deleted

## 2013-07-18 ENCOUNTER — Inpatient Hospital Stay (HOSPITAL_COMMUNITY): Payer: Medicare Other | Admitting: Anesthesiology

## 2013-07-18 DIAGNOSIS — M1711 Unilateral primary osteoarthritis, right knee: Secondary | ICD-10-CM | POA: Diagnosis present

## 2013-07-18 DIAGNOSIS — Z7982 Long term (current) use of aspirin: Secondary | ICD-10-CM

## 2013-07-18 DIAGNOSIS — IMO0001 Reserved for inherently not codable concepts without codable children: Secondary | ICD-10-CM | POA: Diagnosis present

## 2013-07-18 DIAGNOSIS — E785 Hyperlipidemia, unspecified: Secondary | ICD-10-CM | POA: Diagnosis present

## 2013-07-18 DIAGNOSIS — D62 Acute posthemorrhagic anemia: Secondary | ICD-10-CM | POA: Diagnosis not present

## 2013-07-18 DIAGNOSIS — E1165 Type 2 diabetes mellitus with hyperglycemia: Secondary | ICD-10-CM

## 2013-07-18 DIAGNOSIS — Z794 Long term (current) use of insulin: Secondary | ICD-10-CM

## 2013-07-18 DIAGNOSIS — M1710 Unilateral primary osteoarthritis, unspecified knee: Principal | ICD-10-CM | POA: Diagnosis present

## 2013-07-18 DIAGNOSIS — K219 Gastro-esophageal reflux disease without esophagitis: Secondary | ICD-10-CM | POA: Diagnosis present

## 2013-07-18 DIAGNOSIS — M1712 Unilateral primary osteoarthritis, left knee: Secondary | ICD-10-CM | POA: Diagnosis present

## 2013-07-18 HISTORY — PX: TOTAL KNEE ARTHROPLASTY: SHX125

## 2013-07-18 LAB — GLUCOSE, CAPILLARY
GLUCOSE-CAPILLARY: 168 mg/dL — AB (ref 70–99)
GLUCOSE-CAPILLARY: 158 mg/dL — AB (ref 70–99)
GLUCOSE-CAPILLARY: 135 mg/dL — AB (ref 70–99)
Glucose-Capillary: 130 mg/dL — ABNORMAL HIGH (ref 70–99)
Glucose-Capillary: 132 mg/dL — ABNORMAL HIGH (ref 70–99)

## 2013-07-18 SURGERY — ARTHROPLASTY, KNEE, TOTAL
Anesthesia: General | Laterality: Right

## 2013-07-18 MED ORDER — MIDAZOLAM HCL 2 MG/2ML IJ SOLN
INTRAMUSCULAR | Status: AC
  Filled 2013-07-18: qty 2

## 2013-07-18 MED ORDER — PROMETHAZINE HCL 25 MG/ML IJ SOLN: 12.5000 mg | Freq: Four times a day (QID) | INTRAMUSCULAR | Status: DC | PRN

## 2013-07-18 MED ORDER — PROPOFOL 10 MG/ML IV BOLUS
INTRAVENOUS | Status: AC
  Filled 2013-07-18: qty 20

## 2013-07-18 MED ORDER — SUCCINYLCHOLINE CHLORIDE 20 MG/ML IJ SOLN
INTRAMUSCULAR | Status: AC
  Filled 2013-07-18: qty 1

## 2013-07-18 MED ORDER — HYDROMORPHONE HCL PF 1 MG/ML IJ SOLN
INTRAMUSCULAR | Status: AC
  Filled 2013-07-18: qty 1

## 2013-07-18 MED ORDER — MIDAZOLAM HCL 5 MG/5ML IJ SOLN
INTRAMUSCULAR | Status: DC | PRN
  Administered 2013-07-18: 2 mg via INTRAVENOUS

## 2013-07-18 MED ORDER — OXYCODONE-ACETAMINOPHEN 5-325 MG PO TABS
1.0000 | ORAL_TABLET | ORAL | Status: DC | PRN
  Administered 2013-07-18 – 2013-07-20 (×8): 2 via ORAL
  Filled 2013-07-18 (×9): qty 2

## 2013-07-18 MED ORDER — DOCUSATE SODIUM 100 MG PO CAPS
100.0000 mg | ORAL_CAPSULE | Freq: Two times a day (BID) | ORAL | Status: DC
  Administered 2013-07-18 – 2013-07-20 (×5): 100 mg via ORAL
  Filled 2013-07-18 (×5): qty 1

## 2013-07-18 MED ORDER — ASPIRIN EC 325 MG PO TBEC
325.0000 mg | DELAYED_RELEASE_TABLET | Freq: Two times a day (BID) | ORAL | Status: DC
  Administered 2013-07-18 – 2013-07-20 (×4): 325 mg via ORAL
  Filled 2013-07-18 (×6): qty 1

## 2013-07-18 MED ORDER — SODIUM CHLORIDE 0.9 % IJ SOLN
INTRAMUSCULAR | Status: DC | PRN
  Administered 2013-07-18: 40 mL via INTRAVENOUS

## 2013-07-18 MED ORDER — CEFAZOLIN SODIUM-DEXTROSE 2-3 GM-% IV SOLR
2.0000 g | Freq: Four times a day (QID) | INTRAVENOUS | Status: AC
  Administered 2013-07-18 (×2): 2 g via INTRAVENOUS
  Filled 2013-07-18 (×3): qty 50

## 2013-07-18 MED ORDER — KETOROLAC TROMETHAMINE 15 MG/ML IJ SOLN
INTRAMUSCULAR | Status: AC
  Filled 2013-07-18: qty 1

## 2013-07-18 MED ORDER — ACETAMINOPHEN 325 MG PO TABS: 650.0000 mg | ORAL_TABLET | Freq: Four times a day (QID) | ORAL | Status: DC | PRN

## 2013-07-18 MED ORDER — LACTATED RINGERS IV SOLN
INTRAVENOUS | Status: DC | PRN
  Administered 2013-07-18 (×2): via INTRAVENOUS

## 2013-07-18 MED ORDER — ALUM & MAG HYDROXIDE-SIMETH 200-200-20 MG/5ML PO SUSP: 30.0000 mL | ORAL | Status: DC | PRN

## 2013-07-18 MED ORDER — BISACODYL 5 MG PO TBEC: 5.0000 mg | DELAYED_RELEASE_TABLET | Freq: Every day | ORAL | Status: DC | PRN

## 2013-07-18 MED ORDER — HYDROMORPHONE HCL PF 1 MG/ML IJ SOLN
0.2500 mg | INTRAMUSCULAR | Status: DC | PRN
  Administered 2013-07-18: 0.5 mg via INTRAVENOUS

## 2013-07-18 MED ORDER — PROPOFOL 10 MG/ML IV BOLUS
INTRAVENOUS | Status: DC | PRN
  Administered 2013-07-18: 170 mg via INTRAVENOUS

## 2013-07-18 MED ORDER — METHOCARBAMOL 750 MG PO TABS: 750.0000 mg | ORAL_TABLET | Freq: Three times a day (TID) | ORAL | Status: DC | PRN

## 2013-07-18 MED ORDER — HYDROMORPHONE HCL PF 1 MG/ML IJ SOLN
1.0000 mg | INTRAMUSCULAR | Status: DC | PRN
  Administered 2013-07-18: 1 mg via INTRAVENOUS
  Administered 2013-07-18 – 2013-07-19 (×2): 2 mg via INTRAVENOUS
  Administered 2013-07-19 (×2): 1 mg via INTRAVENOUS
  Administered 2013-07-19: 2 mg via INTRAVENOUS
  Filled 2013-07-18 (×2): qty 2
  Filled 2013-07-18: qty 1
  Filled 2013-07-18: qty 2
  Filled 2013-07-18: qty 1

## 2013-07-18 MED ORDER — ASPIRIN EC 325 MG PO TBEC: 325.0000 mg | DELAYED_RELEASE_TABLET | Freq: Two times a day (BID) | ORAL | Status: DC

## 2013-07-18 MED ORDER — SODIUM CHLORIDE 0.9 % IV SOLN
1000.0000 mg | INTRAVENOUS | Status: DC
Start: 2013-07-18 — End: 2013-07-18
  Filled 2013-07-18: qty 10

## 2013-07-18 MED ORDER — CEFUROXIME SODIUM 1.5 G IJ SOLR
INTRAMUSCULAR | Status: AC
  Filled 2013-07-18: qty 1.5

## 2013-07-18 MED ORDER — ESTROGENS CONJUGATED 0.625 MG PO TABS
0.6250 mg | ORAL_TABLET | Freq: Every day | ORAL | Status: DC
  Administered 2013-07-18 – 2013-07-20 (×3): 0.625 mg via ORAL
  Filled 2013-07-18 (×3): qty 1

## 2013-07-18 MED ORDER — DEXTROSE 5 % IV SOLN
500.0000 mg | Freq: Four times a day (QID) | INTRAVENOUS | Status: DC | PRN
Start: 2013-07-18 — End: 2013-07-20

## 2013-07-18 MED ORDER — ARTIFICIAL TEARS OP OINT
TOPICAL_OINTMENT | OPHTHALMIC | Status: DC | PRN
  Administered 2013-07-18: 1 via OPHTHALMIC

## 2013-07-18 MED ORDER — METHOCARBAMOL 1000 MG/10ML IJ SOLN
500.0000 mg | INTRAVENOUS | Status: AC
  Administered 2013-07-18: 500 mg via INTRAVENOUS
  Filled 2013-07-18: qty 5

## 2013-07-18 MED ORDER — ONDANSETRON HCL 4 MG/2ML IJ SOLN: 4.0000 mg | Freq: Four times a day (QID) | INTRAMUSCULAR | Status: DC | PRN

## 2013-07-18 MED ORDER — ROCURONIUM BROMIDE 50 MG/5ML IV SOLN
INTRAVENOUS | Status: AC
  Filled 2013-07-18: qty 1

## 2013-07-18 MED ORDER — METHOCARBAMOL 500 MG PO TABS
500.0000 mg | ORAL_TABLET | Freq: Four times a day (QID) | ORAL | Status: DC | PRN
  Administered 2013-07-18 – 2013-07-20 (×5): 500 mg via ORAL
  Filled 2013-07-18 (×4): qty 1

## 2013-07-18 MED ORDER — KETOROLAC TROMETHAMINE 15 MG/ML IJ SOLN
15.0000 mg | Freq: Four times a day (QID) | INTRAMUSCULAR | Status: AC
  Administered 2013-07-18 – 2013-07-19 (×4): 15 mg via INTRAVENOUS
  Filled 2013-07-18 (×3): qty 1

## 2013-07-18 MED ORDER — LIDOCAINE HCL (CARDIAC) 20 MG/ML IV SOLN
INTRAVENOUS | Status: DC | PRN
  Administered 2013-07-18: 40 mg via INTRAVENOUS

## 2013-07-18 MED ORDER — INSULIN ASPART 100 UNIT/ML ~~LOC~~ SOLN
0.0000 [IU] | Freq: Three times a day (TID) | SUBCUTANEOUS | Status: DC
  Administered 2013-07-18 (×2): 2 [IU] via SUBCUTANEOUS
  Administered 2013-07-19: 5 [IU] via SUBCUTANEOUS
  Administered 2013-07-19: 2 [IU] via SUBCUTANEOUS
  Administered 2013-07-19 – 2013-07-20 (×2): 3 [IU] via SUBCUTANEOUS

## 2013-07-18 MED ORDER — LIDOCAINE HCL (CARDIAC) 20 MG/ML IV SOLN
INTRAVENOUS | Status: AC
  Filled 2013-07-18: qty 5

## 2013-07-18 MED ORDER — TRANEXAMIC ACID 100 MG/ML IV SOLN
1000.0000 mg | INTRAVENOUS | Status: DC | PRN
  Administered 2013-07-18: 1000 mg via INTRAVENOUS

## 2013-07-18 MED ORDER — BUPIVACAINE LIPOSOME 1.3 % IJ SUSP
INTRAMUSCULAR | Status: DC | PRN
  Administered 2013-07-18: 20 mL

## 2013-07-18 MED ORDER — FENTANYL CITRATE 0.05 MG/ML IJ SOLN
INTRAMUSCULAR | Status: DC | PRN
Start: 2013-07-18 — End: 2013-07-18
  Administered 2013-07-18: 50 ug via INTRAVENOUS
  Administered 2013-07-18 (×8): 25 ug via INTRAVENOUS

## 2013-07-18 MED ORDER — GLIPIZIDE ER 2.5 MG PO TB24
2.5000 mg | ORAL_TABLET | Freq: Every day | ORAL | Status: DC
  Administered 2013-07-19 – 2013-07-20 (×2): 2.5 mg via ORAL
  Filled 2013-07-18 (×3): qty 1

## 2013-07-18 MED ORDER — ONDANSETRON HCL 4 MG/2ML IJ SOLN: 4.0000 mg | Freq: Once | INTRAMUSCULAR | Status: DC | PRN

## 2013-07-18 MED ORDER — GLYCOPYRROLATE 0.2 MG/ML IJ SOLN
INTRAMUSCULAR | Status: AC
  Filled 2013-07-18: qty 3

## 2013-07-18 MED ORDER — DIPHENHYDRAMINE HCL 12.5 MG/5ML PO ELIX
12.5000 mg | ORAL_SOLUTION | ORAL | Status: DC | PRN
  Administered 2013-07-18 – 2013-07-20 (×6): 25 mg via ORAL
  Filled 2013-07-18 (×6): qty 10

## 2013-07-18 MED ORDER — POLYETHYLENE GLYCOL 3350 17 G PO PACK: 17.0000 g | PACK | Freq: Every day | ORAL | Status: DC | PRN

## 2013-07-18 MED ORDER — NEOSTIGMINE METHYLSULFATE 10 MG/10ML IV SOLN
INTRAVENOUS | Status: AC
  Filled 2013-07-18: qty 1

## 2013-07-18 MED ORDER — BUPIVACAINE LIPOSOME 1.3 % IJ SUSP
20.0000 mL | INTRAMUSCULAR | Status: AC
  Filled 2013-07-18: qty 20

## 2013-07-18 MED ORDER — ONDANSETRON HCL 4 MG PO TABS: 4.0000 mg | ORAL_TABLET | Freq: Four times a day (QID) | ORAL | Status: DC | PRN

## 2013-07-18 MED ORDER — BUPIVACAINE HCL (PF) 0.25 % IJ SOLN
INTRAMUSCULAR | Status: AC
  Filled 2013-07-18: qty 30

## 2013-07-18 MED ORDER — ACETAMINOPHEN 650 MG RE SUPP: 650.0000 mg | Freq: Four times a day (QID) | RECTAL | Status: DC | PRN

## 2013-07-18 MED ORDER — ONDANSETRON HCL 4 MG/2ML IJ SOLN
INTRAMUSCULAR | Status: AC
  Filled 2013-07-18: qty 2

## 2013-07-18 MED ORDER — FENTANYL CITRATE 0.05 MG/ML IJ SOLN
INTRAMUSCULAR | Status: AC
  Filled 2013-07-18: qty 5

## 2013-07-18 MED ORDER — OXYCODONE-ACETAMINOPHEN 5-325 MG PO TABS: 1.0000 | ORAL_TABLET | Freq: Four times a day (QID) | ORAL | Status: DC | PRN

## 2013-07-18 MED ORDER — SODIUM CHLORIDE 0.9 % IV SOLN
INTRAVENOUS | Status: DC
  Administered 2013-07-18: 13:00:00 via INTRAVENOUS

## 2013-07-18 MED ORDER — SODIUM CHLORIDE 0.9 % IV SOLN
INTRAVENOUS | Status: DC | PRN
  Administered 2013-07-18: 1000 mL

## 2013-07-18 MED ORDER — ONDANSETRON HCL 4 MG/2ML IJ SOLN
INTRAMUSCULAR | Status: DC | PRN
  Administered 2013-07-18: 4 mg via INTRAVENOUS

## 2013-07-18 MED ORDER — SODIUM CHLORIDE 0.9 % IR SOLN
Status: DC | PRN
  Administered 2013-07-18: 1000 mL

## 2013-07-18 SURGICAL SUPPLY — 66 items
APL SKNCLS STERI-STRIP NONHPOA (GAUZE/BANDAGES/DRESSINGS) ×1
BANDAGE ESMARK 6X9 LF (GAUZE/BANDAGES/DRESSINGS) ×1 IMPLANT
BENZOIN TINCTURE PRP APPL 2/3 (GAUZE/BANDAGES/DRESSINGS) ×3 IMPLANT
BLADE SAGITTAL 25.0X1.19X90 (BLADE) ×2 IMPLANT
BLADE SAGITTAL 25.0X1.19X90MM (BLADE) ×1
BLADE SAW SAG 90X13X1.27 (BLADE) ×3 IMPLANT
BNDG CMPR 9X6 STRL LF SNTH (GAUZE/BANDAGES/DRESSINGS) ×1
BNDG ESMARK 6X9 LF (GAUZE/BANDAGES/DRESSINGS) ×3
BOWL SMART MIX CTS (DISPOSABLE) ×3 IMPLANT
CAPT RP KNEE ×2 IMPLANT
CEMENT HV SMART SET (Cement) ×6 IMPLANT
CLOSURE STERI-STRIP 1/2X4 (GAUZE/BANDAGES/DRESSINGS) ×1
CLOSURE WOUND 1/2 X4 (GAUZE/BANDAGES/DRESSINGS) ×1
CLSR STERI-STRIP ANTIMIC 1/2X4 (GAUZE/BANDAGES/DRESSINGS) ×1 IMPLANT
COVER SURGICAL LIGHT HANDLE (MISCELLANEOUS) ×3 IMPLANT
CUFF TOURNIQUET SINGLE 34IN LL (TOURNIQUET CUFF) ×3 IMPLANT
CUFF TOURNIQUET SINGLE 44IN (TOURNIQUET CUFF) IMPLANT
DRAPE EXTREMITY T 121X128X90 (DRAPE) ×3 IMPLANT
DRAPE U-SHAPE 47X51 STRL (DRAPES) ×3 IMPLANT
DRSG PAD ABDOMINAL 8X10 ST (GAUZE/BANDAGES/DRESSINGS) ×3 IMPLANT
DURAPREP 26ML APPLICATOR (WOUND CARE) ×3 IMPLANT
ELECT REM PT RETURN 9FT ADLT (ELECTROSURGICAL) ×3
ELECTRODE REM PT RTRN 9FT ADLT (ELECTROSURGICAL) ×1 IMPLANT
EVACUATOR 1/8 PVC DRAIN (DRAIN) ×3 IMPLANT
FACESHIELD WRAPAROUND (MASK) IMPLANT
FACESHIELD WRAPAROUND OR TEAM (MASK) ×1 IMPLANT
GAUZE XEROFORM 5X9 LF (GAUZE/BANDAGES/DRESSINGS) ×3 IMPLANT
GLOVE BIOGEL PI IND STRL 8 (GLOVE) ×2 IMPLANT
GLOVE BIOGEL PI INDICATOR 8 (GLOVE) ×4
GLOVE ECLIPSE 7.5 STRL STRAW (GLOVE) ×6 IMPLANT
GOWN STRL REUS W/ TWL LRG LVL3 (GOWN DISPOSABLE) ×1 IMPLANT
GOWN STRL REUS W/ TWL XL LVL3 (GOWN DISPOSABLE) ×2 IMPLANT
GOWN STRL REUS W/TWL LRG LVL3 (GOWN DISPOSABLE) ×3
GOWN STRL REUS W/TWL XL LVL3 (GOWN DISPOSABLE) ×6
HANDPIECE INTERPULSE COAX TIP (DISPOSABLE) ×3
HOOD PEEL AWAY FACE SHEILD DIS (HOOD) ×9 IMPLANT
IMMOBILIZER KNEE 20 (SOFTGOODS) IMPLANT
IMMOBILIZER KNEE 22 UNIV (SOFTGOODS) ×3 IMPLANT
KIT BASIN OR (CUSTOM PROCEDURE TRAY) ×3 IMPLANT
KIT ROOM TURNOVER OR (KITS) ×3 IMPLANT
MANIFOLD NEPTUNE II (INSTRUMENTS) ×3 IMPLANT
NDL HYPO 25GX1X1/2 BEV (NEEDLE) IMPLANT
NEEDLE HYPO 25GX1X1/2 BEV (NEEDLE) IMPLANT
NS IRRIG 1000ML POUR BTL (IV SOLUTION) ×3 IMPLANT
PACK TOTAL JOINT (CUSTOM PROCEDURE TRAY) ×3 IMPLANT
PAD ABD 8X10 STRL (GAUZE/BANDAGES/DRESSINGS) ×2 IMPLANT
PAD ARMBOARD 7.5X6 YLW CONV (MISCELLANEOUS) ×6 IMPLANT
PAD CAST 4YDX4 CTTN HI CHSV (CAST SUPPLIES) ×1 IMPLANT
PADDING CAST COTTON 4X4 STRL (CAST SUPPLIES) ×3
SET HNDPC FAN SPRY TIP SCT (DISPOSABLE) ×1 IMPLANT
SPONGE GAUZE 4X4 12PLY (GAUZE/BANDAGES/DRESSINGS) ×3 IMPLANT
SPONGE GAUZE 4X4 12PLY STER LF (GAUZE/BANDAGES/DRESSINGS) ×2 IMPLANT
STAPLER VISISTAT 35W (STAPLE) IMPLANT
STRIP CLOSURE SKIN 1/2X4 (GAUZE/BANDAGES/DRESSINGS) ×2 IMPLANT
SUCTION FRAZIER TIP 10 FR DISP (SUCTIONS) ×3 IMPLANT
SUT MNCRL AB 3-0 PS2 18 (SUTURE) ×2 IMPLANT
SUT VIC AB 0 CTB1 27 (SUTURE) ×6 IMPLANT
SUT VIC AB 1 CT1 27 (SUTURE) ×6
SUT VIC AB 1 CT1 27XBRD ANBCTR (SUTURE) ×2 IMPLANT
SUT VIC AB 2-0 CTB1 (SUTURE) ×6 IMPLANT
SYR 50ML LL SCALE MARK (SYRINGE) ×3 IMPLANT
SYR CONTROL 10ML LL (SYRINGE) IMPLANT
TOWEL OR 17X24 6PK STRL BLUE (TOWEL DISPOSABLE) ×3 IMPLANT
TOWEL OR 17X26 10 PK STRL BLUE (TOWEL DISPOSABLE) ×3 IMPLANT
TRAY FOLEY CATH 16FRSI W/METER (SET/KITS/TRAYS/PACK) ×1 IMPLANT
WATER STERILE IRR 1000ML POUR (IV SOLUTION) ×2 IMPLANT

## 2013-07-18 NOTE — Plan of Care (Signed)
Problem: Consults Goal: Diagnosis- Total Joint Replacement Primary Total Knee Right     

## 2013-07-18 NOTE — Brief Op Note (Signed)
07/18/2013  9:05 AM  PATIENT:  Amanda [name]  68 y.o. female  PRE-OPERATIVE DIAGNOSIS:  DEGENERATIVE JOINT DISEASE  POST-OPERATIVE DIAGNOSIS:  DEGENERATIVE JOINT DISEASE  PROCEDURE:  Procedure(s): TOTAL KNEE ARTHROPLASTY (Right)  SURGEON:  Surgeon(s) and Role:    * Alta Corning, MD - Primary  PHYSICIAN ASSISTANT:   ASSISTANTS: bethune   ANESTHESIA:   general  EBL:  Total I/O In: -  Out: 150 [Blood:150]  BLOOD ADMINISTERED:none  DRAINS: (1) Hemovact drain(s) in the r knee with  Suction Open   LOCAL MEDICATIONS USED:  OTHER experel  SPECIMEN:  No Specimen  DISPOSITION OF SPECIMEN:  N/A  COUNTS:  YES  TOURNIQUET:   Total Tourniquet Time Documented: Thigh (Right) - 55 minutes Total: Thigh (Right) - 55 minutes   DICTATION: .Other Dictation: Dictation Number [id number]  PLAN OF CARE: Admit to inpatient   PATIENT DISPOSITION:  PACU - hemodynamically stable.   Delay start of Pharmacological VTE agent (>24hrs) due to surgical blood loss or risk of bleeding: no

## 2013-07-18 NOTE — Anesthesia Preprocedure Evaluation (Addendum)
Anesthesia Evaluation  Patient identified by MRN, date of birth, ID band Patient awake    Reviewed: Allergy & Precautions, H&P , NPO status , Patient's Chart, lab work & pertinent test results  Airway Mallampati: II TM Distance: >3 FB Neck ROM: Full    Dental  (+) Teeth Intact, Dental Advisory Given   Pulmonary  breath sounds clear to auscultation        Cardiovascular Rhythm:Regular Rate:Normal     Neuro/Psych    GI/Hepatic   Endo/Other  diabetes  Renal/GU      Musculoskeletal   Abdominal (+) + obese,   Peds  Hematology   Anesthesia Other Findings   Reproductive/Obstetrics                          Anesthesia Physical Anesthesia Plan  ASA: III  Anesthesia Plan: General   Post-op Pain Management:    Induction: Intravenous  Airway Management Planned: LMA  Additional Equipment:   Intra-op Plan:   Post-operative Plan:   Informed Consent: I have reviewed the patients History and Physical, chart, labs and discussed the procedure including the risks, benefits and alternatives for the proposed anesthesia with the patient or authorized representative who has indicated his/her understanding and acceptance.   Dental advisory given  Plan Discussed with: CRNA and Anesthesiologist  Anesthesia Plan Comments: (DJD R. Knee Type 2 DM glucose 130  Plan GA with LMA and adductor canal block  Roberts Gaudy, MD  )        Anesthesia Quick Evaluation

## 2013-07-18 NOTE — Transfer of Care (Signed)
Immediate Anesthesia Transfer of Care Note  Patient: Amanda [name]  Procedure(s) Performed: Procedure(s): TOTAL KNEE ARTHROPLASTY (Right)  Patient Location: PACU  Anesthesia Type:General  Level of Consciousness: awake and alert   Airway & Oxygen Therapy: Patient Spontanous Breathing and Patient connected to nasal cannula oxygen  Post-op Assessment: Report given to PACU RN and Post -op Vital signs reviewed and stable  Post vital signs: Reviewed and stable  Complications: No apparent anesthesia complications

## 2013-07-18 NOTE — Care Management Note (Signed)
CARE MANAGEMENT NOTE 07/18/2013  Patient:  Amanda [name], Amanda [name]   Account Number:  [id number]  Date Initiated:  07/18/2013  Documentation initiated by:  Ricki Miller  Subjective/Objective Assessment:   68 yr old female s/p right total knee arthroplasty.     Action/Plan:   Case manager spoke with patient and family concerning home health and DME needs at discharge. Preoperatively setup with [facility], no changes. DME to be delivered. Patient has family support at discharge.   Anticipated DC Date:  07/19/2013   Anticipated DC Plan:  [place]  CM consult      [facility] Choice  HOME HEALTH  DURABLE MEDICAL EQUIPMENT   Choice offered to / List presented to:  C-1 Patient   DME arranged  [facility]  3-N-1  [facility]      DME agency  [employer]     [place] arranged  HH-2 PT      [place].   Status of service:  In process, will continue to follow Medicare Important Message given?   (If response is "NO", the following Medicare IM given date fields will be blank) Date Medicare IM given:   Date Additional Medicare IM given:    Discharge Disposition:    Per UR Regulation:    If discussed at Long Length of Stay Meetings, dates discussed:    Comments:

## 2013-07-18 NOTE — Anesthesia Procedure Notes (Signed)
Anesthesia Regional Block:  Adductor canal block  Pre-Anesthetic Checklist: ,, timeout performed, Correct Patient, Correct Site, Correct Laterality, Correct Procedure, Correct Position, site marked, Risks and benefits discussed,  Surgical consent,  Pre-op evaluation,  At surgeon's request and post-op pain management  Laterality: Right  Prep: chloraprep       Needles:  Injection technique: Single-shot  Needle Type: Echogenic Stimulator Needle     Needle Length: 9cm 9 cm Needle Gauge: 22 and 22 G    Additional Needles:  Procedures: ultrasound guided (picture in chart) Adductor canal block Narrative:  Start time: 07/18/2013 7:15 AM End time: 07/18/2013 7:20 AM Injection made incrementally with aspirations every 5 mL.  Performed by: Personally   Additional Notes: 25 cc 0.5% Marcaine with 1:200 Epi injected easily

## 2013-07-18 NOTE — Evaluation (Signed)
Physical Therapy Evaluation Patient Details Name: Amanda [name] MRN: [id number] DOB: Jul 14, 1945 Today's Date: 07/18/2013   History of Present Illness  Pt admitted for R TKA  Clinical Impression  Pt mobilizing well POD#0. Pt educated for restrictions, KI, CPM, WBAT and plan. Pt will benefit from acute therapy to maximize ROM, strength and function to address deficits below and return pt to PLOF. RN present and aware of mobility. Pt in chair end of session with footsie roll.    Follow Up Recommendations Home health PT    Equipment Recommendations  Rolling walker with 5" wheels    Recommendations for Other Services       Precautions / Restrictions Precautions Precautions: Knee Required Braces or Orthoses: Knee Immobilizer - Right Knee Immobilizer - Right: On when out of bed or walking Restrictions Weight Bearing Restrictions: Yes RLE Weight Bearing: Weight bearing as tolerated      Mobility  Bed Mobility Overal bed mobility: Needs Assistance Bed Mobility: Supine to Sit     Supine to sit: Min assist     General bed mobility comments: cues for sequence with assist of RLE and HOB 20degrees  Transfers Overall transfer level: Needs assistance   Transfers: Sit to/from Stand Sit to Stand: Min guard         General transfer comment: cues for hand placement and sequence  Ambulation/Gait Ambulation/Gait assistance: Supervision Ambulation Distance (Feet): 18 Feet Assistive device: Rolling walker (2 wheeled) Gait Pattern/deviations: Step-to pattern     General Gait Details: cues for sequence  Stairs            Wheelchair Mobility    Modified Rankin (Stroke Patients Only)       Balance                                             Pertinent Vitals/Pain Pt reporting 9/10 pain end of session Right knee, no outward signs of distress or pain at the level rated, RN aware and pt was premedicated    Home Living Family/patient expects to be  discharged to:: Private residence Living Arrangements: Alone Available Help at Discharge: Family;Available 24 hours/day Type of Home: House Home Access: Stairs to enter Entrance Stairs-Rails: Right;Left;Can reach both Entrance Stairs-Number of Steps: 3 Home Layout: One level Home Equipment: None      Prior Function Level of Independence: Independent               Hand Dominance        Extremity/Trunk Assessment   Upper Extremity Assessment: Overall WFL for tasks assessed           Lower Extremity Assessment: RLE deficits/detail RLE Deficits / Details: limited strength and ROM as expected post op    Cervical / Trunk Assessment: Normal  Communication   Communication: No difficulties  Cognition Arousal/Alertness: Awake/alert Behavior During Therapy: WFL for tasks assessed/performed Overall Cognitive Status: Within Functional Limits for tasks assessed                      General Comments      Exercises Total Joint Exercises Ankle Circles/Pumps: AROM;Right;5 reps;Supine Quad Sets: AROM;5 reps;Supine;Right Heel Slides: AAROM;Right;5 reps;Supine      Assessment/Plan    PT Assessment Patient needs continued PT services  PT Diagnosis Difficulty walking;Acute pain   PT Problem List Decreased strength;Decreased range of motion;Decreased activity tolerance;Decreased mobility;Pain;Decreased  knowledge of precautions;Decreased knowledge of use of DME  PT Treatment Interventions Gait training;Stair training;Functional mobility training;Therapeutic activities;Therapeutic exercise;Patient/family education;DME instruction   PT Goals (Current goals can be found in the Care Plan section) Acute Rehab PT Goals Patient Stated Goal: return home and to work (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[place]) PT Goal Formulation: With patient Time For Goal Achievement: 07/25/13 Potential to Achieve Goals: Good    Frequency 7X/week   Barriers to discharge         Co-evaluation               End of Session Equipment Utilized During Treatment: Gait belt;Right knee immobilizer Activity Tolerance: Patient tolerated treatment well Patient left: in chair;with call bell/phone within reach;with nursing/sitter in room Nurse Communication: Mobility status;Precautions;Weight bearing status;Patient requests pain meds         Time: 1497-0263 PT Time Calculation (min): 21 min   Charges:   PT Evaluation $Initial PT Evaluation Tier I: 1 Procedure PT Treatments $Therapeutic Activity: 8-22 mins   PT G Codes:          Melford Aase 07/18/2013, 1:38 PM Elwyn Reach, [place]

## 2013-07-18 NOTE — Op Note (Signed)
NAME:  Amanda, [name] NO.:  [id number]  MEDICAL RECORD NO.:  [id number]  LOCATION:  [facility]                         FACILITY:  [facility]  PHYSICIAN:  Alta Corning, M.D.   DATE OF BIRTH:  11-Mar-1945  DATE OF PROCEDURE:  07/18/2013 DATE OF DISCHARGE:  07/20/2013                              OPERATIVE REPORT   PREOPERATIVE DIAGNOSIS:  End-stage degenerative joint disease, right knee.  POSTOPERATIVE DIAGNOSIS:  End-stage degenerative joint disease, right knee.  PROCEDURE:  Right total knee replacement with Sigma system, size 3 femur, size 3 tibia, 15 mm bridging bearing, and a 35 mm all- polyethylene patella.  SURGEON:  Alta Corning, MD  ASSISTANT:  Gary Fleet, PA  ANESTHESIA:  General.  BRIEF HISTORY:  Amanda [name] is a 68 year old female, with a long history of significant complaints of right knee pain.  She had been treated conservatively for prolonged period of time with activity modification, injection therapy, physical therapy, and after failure of all conservative care, and because of ongoing night pain and light activity pain, she was taken to the operating room for right total knee replacement.  DESCRIPTION OF PROCEDURE:  The patient was taken to the operating room. After adequate anesthesia was obtained with the general anesthetic, the patient placed supine on operating table.  The right leg was then prepped and draped in usual sterile fashion.  Following this, the leg was exsanguinated.  Blood pressure tourniquet inflated to 350 mmHg. Following this, a midline incision was made in the subcutaneous tissue dissected down to the level of extensor mechanism and a medial parapatellar arthrotomy was undertaken.  Once this was done, the attention was turned towards the knee where medial and lateral meniscus were removed, retropatellar fat pad, and synovium in the anterior aspect of the femur and the anterior and posterior cruciate.  At this point, the  tibia was cut perpendicular to its long axis and the femurs were then cut with a 5-degree valgus inclination, 10 mm of bone taken off the distal femur.  Once this was completed, attention was turned towards the sizing and sized to a 3.  Anterior and posterior cuts were made, chamfers and box, tibia sized to a 3, was drilled and keeled.  Following this, the trial components were put in place, size 3 femur and size 3 tibia with the patella that was cut down to a level of 14 mm and a 35 paddle was chosen and the patellar trial was put in place.  At this point, the knee was put through a range of motion, has excellent stability, and through a range of motion and at this point, the wounds were copiously irrigated and suctioned dry.  The trial components were removed and the final components were then cemented in place, size 3 femur, size 3 tibia, 12.5 mm bearing trial was placed, and a 35 all poly patella was held with a clamp.  All excess bone cement was removed and the cement was allowed to harden.  At that time, the tourniquet was let down.  All bleeding was controlled with electrocautery.  Still was a little loosening in flexion and extension, came easily to full extension.  I tried a 12.5 trial at this point and liked that a little better, and with a 12.5 poly at this point, excellent range of motion and stability was achieved at this point.  The knee was again irrigated, suctioned dry.  A 60 mL of Exparel which was 20 mL of Exparel diluted to 60 with saline was instilled throughout the knee for postoperative anesthesia.  At this point, the wounds were copiously irrigated and suctioned dry, and the medium Hemovac drain was placed.  The parapatellar arthrotomy was closed with 1 Vicryl running, skin with 0 and 2-0 Vicryl and 3-0 Monocryl subcuticular.  Benzoin and Steri-Strips were applied.  Sterile compressive dressing was applied.  The patient was taken to recovery room and she was noted  to be in satisfactory condition.  Estimated blood loss for procedure was minimal.  The closure was standard with Monocryl subcuticular.     Alta Corning, M.D.     Corliss Skains  D:  07/18/2013  T:  07/18/2013  Job:  [id number]

## 2013-07-18 NOTE — Progress Notes (Signed)
Utilization review completed.  

## 2013-07-18 NOTE — Discharge Instructions (Signed)
Total Knee Replacement Care After Refer to this sheet in the next few weeks. These instructions provide you with information on caring for yourself after your procedure. Your caregiver also may give you specific instructions. Your treatment has been planned according to the most current medical practices, but problems sometimes occur. Call your caregiver if you have any problems or questions after your procedure. HOME CARE INSTRUCTIONS   See a physical therapist as directed by your caregiver.  Take over-the-counter or prescription medicines for pain, discomfort, or fever only as directed by your caregiver.  Avoid lifting or driving until you are instructed otherwise.  If you have been sent home with a continuous passive motion machine, use it as directed by your caregiver. SEEK MEDICAL CARE IF:  You have difficulty breathing.  Your wound is red, swollen, or has become increasingly painful.  You have pus draining from your wound.  You have a bad smell coming from your wound.  You have persistent bleeding from your wound.  Your wound breaks open after sutures (stitches) or staples have been removed. SEEK IMMEDIATE MEDICAL CARE IF:   You have a fever.  You have a rash.  You have pain or swelling in your calf or thigh.  You have shortness of breath or chest pain.  Your range of motion in your knee is decreasing rather than increasing. MAKE SURE YOU:   Understand these instructions.  Will watch your condition.  Will get help right away if you are not doing well or get worse. Document Released: 08/12/2004 Document Revised: 07/25/2011 Document Reviewed: 03/14/2011 [facility] Patient Information 2014 [employer].

## 2013-07-18 NOTE — Progress Notes (Signed)
Orthopedic Tech Progress Note Patient Details:  Amanda [name] [birth year] [id number]  Patient ID: [name], female   DOB: 11/23/1945, 68 y.o.   MRN: [id number] Placed pt's rle in cpm @ 0-60 degrees @1735   Hildred Priest 07/18/2013, 7:38 PM

## 2013-07-18 NOTE — H&P (Signed)
TOTAL KNEE ADMISSION H&P  Patient is being admitted for right total knee arthroplasty.  Subjective:  Chief Complaint:right knee pain.  HPI: Amanda [name], 68 y.o. female, has a history of pain and functional disability in the right knee due to arthritis and has failed non-surgical conservative treatments for greater than 12 weeks to includeNSAID's and/or analgesics, corticosteriod injections, viscosupplementation injections, flexibility and strengthening excercises, use of assistive devices, weight reduction as appropriate and activity modification.  Onset of symptoms was gradual, starting 6 years ago with gradually worsening course since that time. The patient noted prior procedures on the knee to include  arthroscopy and menisectomy on the right knee(s).  Patient currently rates pain in the right knee(s) at 9 out of 10 with activity. Patient has night pain, worsening of pain with activity and weight bearing, pain that interferes with activities of daily living, pain with passive range of motion, crepitus and joint swelling.  Patient has evidence of subchondral sclerosis, periarticular osteophytes, joint subluxation and joint space narrowing by imaging studies. This patient has had failure of all conservative care. There is no active infection.  Patient Active Problem List   Diagnosis Date Noted  . Other and unspecified hyperlipidemia 11/26/2012  . Osteoarthritis of both knees 11/26/2012  . Type II or unspecified type diabetes mellitus without mention of complication, uncontrolled 03/11/2007  . COUGH 03/11/2007   Past Medical History  Diagnosis Date  . Diabetes mellitus without complication   . GERD (gastroesophageal reflux disease)     Pt takes OTC when needed.  . Arthritis   . Carpal tunnel syndrome     Past Surgical History  Procedure Laterality Date  . Shoulder surgery Right   . Appendectomy    . Abdominal hysterectomy    . Tonsillectomy    . Eye surgery Bilateral     Cataracts  removed  . Colonoscopy    . Esophagogastroduodenoscopy      Prescriptions prior to admission  Medication Sig Dispense Refill  . aspirin 81 MG tablet Take 81 mg by mouth daily.      . Dulaglutide (TRULICITY) 2.95 FM/7.3UY SOPN Inject 0.75 mg into the skin once a week.       . estrogens, conjugated, (PREMARIN) 0.625 MG tablet Take 0.625 mg by mouth daily. Take daily for 21 days then do not take for 7 days.      . fexofenadine (ALLEGRA) 180 MG tablet Take 180 mg by mouth daily as needed for allergies or rhinitis.      Marland Kitchen glipiZIDE (GLUCOTROL XL) 2.5 MG 24 hr tablet Take 2.5 mg by mouth daily with breakfast.      . glucose blood (ACCU-CHEK AVIVA PLUS) test strip Use as instructed to check blood sugar once a day  50 each  3  . Insulin Pen Needle (BD PEN NEEDLE NANO U/F) 32G X 4 MM MISC Use as directed  100 each  3  . Multiple Vitamins-Minerals (MULTIVITAMIN WITH MINERALS) tablet Take 1 tablet by mouth daily.      . rosuvastatin (CRESTOR) 10 MG tablet Take 10 mg by mouth daily.      . Vitamin D, Ergocalciferol, (DRISDOL) 50000 UNITS CAPS capsule Take 50,000 Units by mouth every 7 (seven) days.       Allergies  Allergen Reactions  . Meperidine Hcl     REACTION: rash  . Statins Other (See Comments)    Muscle pain. Pt able to take Crestor but not everyday.    History  Substance Use Topics  .  Smoking status: Never Smoker   . Smokeless tobacco: Never Used  . Alcohol Use: No     Comment: socially, occ weekends    History reviewed. No pertinent family history.   ROS ROS: I have reviewed the patient's review of systems thoroughly and there are no positive responses as relates to the HPI. Objective:  Physical Exam  Vital signs in last 24 hours: Temp:  [97.8 F (36.6 C)] 97.8 F (36.6 C) (06/12 0559) Pulse Rate:  [82] 82 (06/12 0559) Resp:  [16] 16 (06/12 0559) BP: (129)/(46) 129/46 mmHg (06/12 0559) SpO2:  [96 %] 96 % (06/12 0559) Well-developed well-nourished patient in no acute  distress. Alert and oriented x3 HEENT:within normal limits Cardiac: Regular rate and rhythm Pulmonary: Lungs clear to auscultation Abdomen: Soft and nontender.  Normal active bowel sounds  Musculoskeletal: right knee: Tender to palpation over the medial joint line.  No instability.  Range of motion 5-95.  Mild varus malalignment. Labs: Recent Results (from the past 2160 hour(s))  HEMOGLOBIN A1C     Status: Abnormal   Collection Time    07/01/13  8:31 AM      Result Value Ref Range   Hemoglobin A1C 7.2 (*) 4.6 - 6.5 %   Comment: Glycemic Control Guidelines for People with Diabetes:Non Diabetic:  <6%Goal of Therapy: <7%Additional Action Suggested:  >8%   COMPREHENSIVE METABOLIC PANEL     Status: Abnormal   Collection Time    07/01/13  8:31 AM      Result Value Ref Range   Sodium 139  135 - 145 mEq/L   Potassium 3.7  3.5 - 5.1 mEq/L   Chloride 106  96 - 112 mEq/L   CO2 25  19 - 32 mEq/L   Glucose, Bld 123 (*) 70 - 99 mg/dL   BUN 13  6 - 23 mg/dL   Creatinine, Ser 0.7  0.4 - 1.2 mg/dL   Total Bilirubin 0.5  0.2 - 1.2 mg/dL   Alkaline Phosphatase 40  39 - 117 U/L   AST 17  0 - 37 U/L   ALT 14  0 - 35 U/L   Total Protein 7.0  6.0 - 8.3 g/dL   Albumin 3.6  3.5 - 5.2 g/dL   Calcium 9.5  8.4 - 10.5 mg/dL   GFR 107.00  >60.00 mL/min  MICROALBUMIN / CREATININE URINE RATIO     Status: None   Collection Time    07/01/13  8:31 AM      Result Value Ref Range   Microalb, Ur 0.6  0.0 - 1.9 mg/dL   Creatinine,U 22.6     Microalb Creat Ratio 2.7  0.0 - 30.0 mg/g  URINALYSIS, ROUTINE W REFLEX MICROSCOPIC     Status: Abnormal   Collection Time    07/01/13  8:31 AM      Result Value Ref Range   Color, Urine YELLOW  Yellow;Lt. Yellow   APPearance CLEAR  Clear   Specific Gravity, Urine <=1.005 (*) 1.000 - 1.030   pH 5.5  5.0 - 8.0   Total Protein, Urine NEGATIVE  Negative   Urine Glucose NEGATIVE  Negative   Ketones, ur NEGATIVE  Negative   Bilirubin Urine NEGATIVE  Negative   Hgb urine  dipstick NEGATIVE  Negative   Urobilinogen, UA 0.2  0.0 - 1.0   Leukocytes, UA NEGATIVE  Negative   Nitrite NEGATIVE  Negative   WBC, UA 0-2/hpf  0-2/hpf   RBC / HPF none seen  0-2/hpf  Squamous Epithelial / LPF Rare(0-4/hpf)  Rare(0-4/hpf)  URINALYSIS, ROUTINE W REFLEX MICROSCOPIC     Status: Abnormal   Collection Time    07/10/13  3:48 PM      Result Value Ref Range   Color, Urine YELLOW  YELLOW   APPearance HAZY (*) CLEAR   Specific Gravity, Urine 1.018  1.005 - 1.030   pH 5.5  5.0 - 8.0   Glucose, UA NEGATIVE  NEGATIVE mg/dL   Hgb urine dipstick NEGATIVE  NEGATIVE   Bilirubin Urine NEGATIVE  NEGATIVE   Ketones, ur NEGATIVE  NEGATIVE mg/dL   Protein, ur NEGATIVE  NEGATIVE mg/dL   Urobilinogen, UA 0.2  0.0 - 1.0 mg/dL   Nitrite NEGATIVE  NEGATIVE   Leukocytes, UA NEGATIVE  NEGATIVE   Comment: MICROSCOPIC NOT DONE ON URINES WITH NEGATIVE PROTEIN, BLOOD, LEUKOCYTES, NITRITE, OR GLUCOSE <1000 mg/dL.  APTT     Status: None   Collection Time    07/10/13  3:49 PM      Result Value Ref Range   aPTT 30  24 - 37 seconds  CBC WITH DIFFERENTIAL     Status: Abnormal   Collection Time    07/10/13  3:49 PM      Result Value Ref Range   WBC 9.5  4.0 - 10.5 K/uL   RBC 4.22  3.87 - 5.11 MIL/uL   Hemoglobin 13.1  12.0 - 15.0 g/dL   HCT 39.8  36.0 - 46.0 %   MCV 94.3  78.0 - 100.0 fL   MCH 31.0  26.0 - 34.0 pg   MCHC 32.9  30.0 - 36.0 g/dL   RDW 14.9  11.5 - 15.5 %   Platelets 403 (*) 150 - 400 K/uL   Neutrophils Relative % 37 (*) 43 - 77 %   Neutro Abs 3.5  1.7 - 7.7 K/uL   Lymphocytes Relative 53 (*) 12 - 46 %   Lymphs Abs 5.0 (*) 0.7 - 4.0 K/uL   Monocytes Relative 8  3 - 12 %   Monocytes Absolute 0.7  0.1 - 1.0 K/uL   Eosinophils Relative 2  0 - 5 %   Eosinophils Absolute 0.2  0.0 - 0.7 K/uL   Basophils Relative 0  0 - 1 %   Basophils Absolute 0.0  0.0 - 0.1 K/uL  COMPREHENSIVE METABOLIC PANEL     Status: Abnormal   Collection Time    07/10/13  3:49 PM      Result Value Ref  Range   Sodium 140  137 - 147 mEq/L   Potassium 4.1  3.7 - 5.3 mEq/L   Chloride 99  96 - 112 mEq/L   CO2 28  19 - 32 mEq/L   Glucose, Bld 99  70 - 99 mg/dL   BUN 13  6 - 23 mg/dL   Creatinine, Ser 0.64  0.50 - 1.10 mg/dL   Calcium 10.3  8.4 - 10.5 mg/dL   Total Protein 7.8  6.0 - 8.3 g/dL   Albumin 4.0  3.5 - 5.2 g/dL   AST 16  0 - 37 U/L   ALT 12  0 - 35 U/L   Alkaline Phosphatase 54  39 - 117 U/L   Total Bilirubin 0.3  0.3 - 1.2 mg/dL   GFR calc non Af Amer 90 (*) >90 mL/min   GFR calc Af Amer >90  >90 mL/min   Comment: (NOTE)     The eGFR has been calculated using the CKD EPI equation.       This calculation has not been validated in all clinical situations.     eGFR's persistently <90 mL/min signify possible Chronic Kidney     Disease.  PROTIME-INR     Status: None   Collection Time    07/10/13  3:49 PM      Result Value Ref Range   Prothrombin Time 12.2  11.6 - 15.2 seconds   INR 0.92  0.00 - 1.49  TYPE AND SCREEN     Status: None   Collection Time    07/10/13  3:57 PM      Result Value Ref Range   ABO/RH(D) A POS     Antibody Screen NEG     Sample Expiration 07/24/2013    ABO/RH     Status: None   Collection Time    07/10/13  3:57 PM      Result Value Ref Range   ABO/RH(D) A POS    SURGICAL PCR SCREEN     Status: None   Collection Time    07/10/13  4:03 PM      Result Value Ref Range   MRSA, PCR NEGATIVE  NEGATIVE   Staphylococcus aureus NEGATIVE  NEGATIVE   Comment:            The Xpert SA Assay (FDA     approved for NASAL specimens     in patients over 21 years of age),     is one component of     a comprehensive surveillance     program.  Test performance has     been validated by [facility] for patients greater     than or equal to 1 year old.     It is not intended     to diagnose infection nor to     guide or monitor treatment.  GLUCOSE, CAPILLARY     Status: Abnormal   Collection Time    07/18/13  6:37 AM      Result Value Ref Range    Glucose-Capillary 130 (*) 70 - 99 mg/dL    Estimated body mass index is 34.72 kg/(m^2) as calculated from the following:   Height as of 07/10/13: 5' 2.5" (1.588 m).   Weight as of 07/03/13: 193 lb (87.544 kg).   Imaging Review Plain radiographs demonstrate severe degenerative joint disease of the right knee(s). The overall alignment ismild varus. The bone quality appears to be good for age and reported activity level.  Assessment/Plan:  End stage arthritis, right knee   The patient history, physical examination, clinical judgment of the provider and imaging studies are consistent with end stage degenerative joint disease of the right knee(s) and total knee arthroplasty is deemed medically necessary. The treatment options including medical management, injection therapy arthroscopy and arthroplasty were discussed at length. The risks and benefits of total knee arthroplasty were presented and reviewed. The risks due to aseptic loosening, infection, stiffness, patella tracking problems, thromboembolic complications and other imponderables were discussed. The patient acknowledged the explanation, agreed to proceed with the plan and consent was signed. Patient is being admitted for inpatient treatment for surgery, pain control, PT, OT, prophylactic antibiotics, VTE prophylaxis, progressive ambulation and ADL's and discharge planning. The patient is planning to be discharged home with home health services   

## 2013-07-18 NOTE — Progress Notes (Signed)
Orthopedic Tech Progress Note Patient Details:  Amanda [name] [birth year] [id number] Applied to RLE with appropriate settings. OHF applied to bed. Footsie roll provided. CPM Right Knee CPM Right Knee: On Right Knee Flexion (Degrees): 60 Right Knee Extension (Degrees): 0   Asia R Thompson 07/18/2013, 10:50 AM

## 2013-07-18 NOTE — Anesthesia Postprocedure Evaluation (Signed)
  Anesthesia Post-op Note  Patient: Amanda [name]  Procedure(s) Performed: Procedure(s): TOTAL KNEE ARTHROPLASTY (Right)  Patient Location: PACU  Anesthesia Type:GA combined with regional for post-op pain  Level of Consciousness: awake, alert  and oriented  Airway and Oxygen Therapy: Patient Spontanous Breathing and Patient connected to nasal cannula oxygen  Post-op Pain: mild  Post-op Assessment: Post-op Vital signs reviewed, Patient's Cardiovascular Status Stable, Respiratory Function Stable, Patent Airway, No signs of Nausea or vomiting and Pain level controlled  Post-op Vital Signs: stable  Last Vitals:  Filed Vitals:   07/18/13 1308  BP: 155/61  Pulse: 76  Temp: 36.5 C  Resp: 16    Complications: No apparent anesthesia complications

## 2013-07-19 LAB — GLUCOSE, CAPILLARY
GLUCOSE-CAPILLARY: 205 mg/dL — AB (ref 70–99)
Glucose-Capillary: 152 mg/dL — ABNORMAL HIGH (ref 70–99)
Glucose-Capillary: 140 mg/dL — ABNORMAL HIGH (ref 70–99)
Glucose-Capillary: 122 mg/dL — ABNORMAL HIGH (ref 70–99)

## 2013-07-19 LAB — BASIC METABOLIC PANEL
BUN: 11 mg/dL (ref 6–23)
CO2: 26 mEq/L (ref 19–32)
Calcium: 9.1 mg/dL (ref 8.4–10.5)
Chloride: 100 mEq/L (ref 96–112)
Creatinine, Ser: 0.83 mg/dL (ref 0.50–1.10)
GFR, EST AFRICAN AMERICAN: 82 mL/min — AB (ref >90)
GFR, EST NON AFRICAN AMERICAN: 71 mL/min — AB (ref >90)
Glucose, Bld: 135 mg/dL — ABNORMAL HIGH (ref 70–99)
POTASSIUM: 4.6 meq/L (ref 3.7–5.3)
Sodium: 137 mEq/L (ref 137–147)

## 2013-07-19 LAB — CBC
HCT: 29.4 % — ABNORMAL LOW (ref 36.0–46.0)
Hemoglobin: 9.6 g/dL — ABNORMAL LOW (ref 12.0–15.0)
MCH: 30.5 pg (ref 26.0–34.0)
MCHC: 32.7 g/dL (ref 30.0–36.0)
MCV: 93.3 fL (ref 78.0–100.0)
Platelets: 293 10*3/uL (ref 150–400)
RBC: 3.15 MIL/uL — ABNORMAL LOW (ref 3.87–5.11)
RDW: 14.5 % (ref 11.5–15.5)
WBC: 8.9 10*3/uL (ref 4.0–10.5)

## 2013-07-19 NOTE — Progress Notes (Signed)
Physical Therapy Treatment Patient Details Name: Amanda [name] MRN: [id number] DOB: 1945/10/21 Today's Date: 07/19/2013    History of Present Illness Pt admitted for R TKA    PT Comments    Progressing well. Will work on increasing therex and ambulation this afternoon.   Follow Up Recommendations  Home health PT     Equipment Recommendations  Rolling walker with 5" wheels    Recommendations for Other Services       Precautions / Restrictions Precautions Precautions: Knee Required Braces or Orthoses: Knee Immobilizer - Right Knee Immobilizer - Right: On when out of bed or walking Restrictions RLE Weight Bearing: Weight bearing as tolerated    Mobility  Bed Mobility Overal bed mobility: Needs Assistance Bed Mobility: Supine to Sit     Supine to sit: Supervision     General bed mobility comments: Cues to ensure technique  Transfers Overall transfer level: Needs assistance Equipment used: Rolling walker (2 wheeled) Transfers: Sit to/from Stand Sit to Stand: Supervision         General transfer comment: cues for hand placement and sequence  Ambulation/Gait Ambulation/Gait assistance: Supervision Ambulation Distance (Feet): 180 Feet Assistive device: Rolling walker (2 wheeled) Gait Pattern/deviations: Step-to pattern;Decreased step length - right;Decreased step length - left     General Gait Details: Cues for safe use of RW   Stairs            Wheelchair Mobility    Modified Rankin (Stroke Patients Only)       Balance                                    Cognition Arousal/Alertness: Awake/alert Behavior During Therapy: WFL for tasks assessed/performed Overall Cognitive Status: Within Functional Limits for tasks assessed                      Exercises Total Joint Exercises Quad Sets: AROM;Right;10 reps Heel Slides: AAROM;Right;10 reps Hip ABduction/ADduction: AAROM;Right;10 reps Straight Leg Raises: AAROM;Right;10  reps    General Comments        Pertinent Vitals/Pain no apparent distress     Home Living                      Prior Function            PT Goals (current goals can now be found in the care plan section) Progress towards PT goals: Progressing toward goals    Frequency  7X/week    PT Plan Current plan remains appropriate    Co-evaluation             End of Session Equipment Utilized During Treatment: Gait belt;Right knee immobilizer Activity Tolerance: Patient tolerated treatment well Patient left: in chair;with call bell/phone within reach     Time: 0841-0904 PT Time Calculation (min): 23 min  Charges:  $Gait Training: 8-22 mins $Therapeutic Exercise: 8-22 mins                    G Codes:      Amanda [name], Amanda [name] 07/19/2013, 12:02 PM 07/19/2013 Amanda [name] PTA [phone] pager [phone] office

## 2013-07-19 NOTE — Progress Notes (Signed)
Patient requests that left hand IV be removed. Patient stated that the IV has been hurting her hand all day. Patient stated that when the IV was inserted that the RN whom placed the IV forced the IV to go in when she should have taken the IV out. RN removed the IV and paged the IV team for an IV replacement.

## 2013-07-19 NOTE — Progress Notes (Signed)
Physical Therapy Treatment Patient Details Name: Amanda [name] MRN: [id number] DOB: 1945-03-17 Today's Date: 07/19/2013    History of Present Illness Pt admitted for R TKA    PT Comments    Patient progressing very well. Did not want to attempt steps today due to pain but will practice tomorrow before DC home. No KI used with ambulation and no evidence of buckling noted.   Follow Up Recommendations  Home health PT     Equipment Recommendations  Rolling walker with 5" wheels    Recommendations for Other Services       Precautions / Restrictions Precautions Precautions: Knee Required Braces or Orthoses: Knee Immobilizer - Right Knee Immobilizer - Right: On when out of bed or walking Restrictions RLE Weight Bearing: Weight bearing as tolerated    Mobility  Bed Mobility Overal bed mobility: Modified Independent Bed Mobility: Supine to Sit     Supine to sit: Supervision     General bed mobility comments: Cues to ensure technique  Transfers Overall transfer level: Modified independent Equipment used: Rolling walker (2 wheeled) Transfers: Sit to/from Stand Sit to Stand: Supervision         General transfer comment: cues for hand placement and sequence  Ambulation/Gait Ambulation/Gait assistance: Supervision Ambulation Distance (Feet): 250 Feet Assistive device: Rolling walker (2 wheeled) Gait Pattern/deviations: Step-through pattern;Decreased stride length     General Gait Details: Patient able to start walking with step through pattern.    Stairs            Wheelchair Mobility    Modified Rankin (Stroke Patients Only)       Balance                                    Cognition Arousal/Alertness: Awake/alert Behavior During Therapy: WFL for tasks assessed/performed Overall Cognitive Status: Within Functional Limits for tasks assessed                      Exercises Total Joint Exercises Quad Sets: AROM;Right;10  reps Heel Slides: AAROM;Right;10 reps Hip ABduction/ADduction: AAROM;Right;10 reps Straight Leg Raises: AAROM;Right;10 reps    General Comments        Pertinent Vitals/Pain 8/10 R knee pain. Patient just out of CPM and repositioned back to bed comfortably    Home Living                      Prior Function            PT Goals (current goals can now be found in the care plan section) Progress towards PT goals: Progressing toward goals    Frequency  7X/week    PT Plan Current plan remains appropriate    Co-evaluation             End of Session Equipment Utilized During Treatment: Gait belt Activity Tolerance: Patient tolerated treatment well Patient left: with call bell/phone within reach;in bed     Time: 1320-1336 PT Time Calculation (min): 16 min  Charges:  $Gait Training: 8-22 mins $Therapeutic Exercise: 8-22 mins                    G Codes:      Kentrell, Guettler 07/19/2013, 2:09 PM  07/19/2013 Jacqualyn Posey PTA [phone] pager [phone] office

## 2013-07-19 NOTE — Progress Notes (Signed)
Subjective: 1 Day Post-Op Procedure(s) (LRB): TOTAL KNEE ARTHROPLASTY (Right) Patient reports pain as 4 on 0-10 scale.   Taking by mouth/voiding okay.  Up with therapy yesterday.  Objective: Vital signs in last 24 hours: Temp:  [97.7 F (36.5 C)-99.8 F (37.7 C)] 98.7 F (37.1 C) (06/13 0630) Pulse Rate:  [76-98] 94 (06/13 0630) Resp:  [6-18] 18 (06/13 0630) BP: (104-182)/(3-87) 156/61 mmHg (06/13 0630) SpO2:  [97 %-100 %] 100 % (06/13 0630)  Intake/Output from previous day: 06/12 0701 - 06/13 0700 In: 3825 [P.O.:960; I.V.:2770; IV Piggyback:55] Out: 475 [Drains:325; Blood:150] Intake/Output this shift:     Recent Labs  07/19/13 0405  HGB 9.6*    Recent Labs  07/19/13 0405  WBC 8.9  RBC 3.15*  HCT 29.4*  PLT 293    Recent Labs  07/19/13 0405  NA 137  K 4.6  CL 100  CO2 26  BUN 11  CREATININE 0.83  GLUCOSE 135*  CALCIUM 9.1  right knee exam: Drain intact. Neurovascular intact Sensation intact distally Intact pulses distally Dorsiflexion/Plantar flexion intact Incision: dressing C/D/I Compartment soft  Assessment/Plan: 1 Day Post-Op Procedure(s) (LRB): TOTAL KNEE ARTHROPLASTY (Right) Plan: Hemovac drain pulled Aspirin 325 mg twice daily with SCDs for DVT prophylaxis. Continue CPM. Up with therapy Plan for discharge tomorrow  Amanda [name] G 07/19/2013, 9:35 AM

## 2013-07-20 DIAGNOSIS — D62 Acute posthemorrhagic anemia: Secondary | ICD-10-CM | POA: Diagnosis not present

## 2013-07-20 LAB — CBC
HEMATOCRIT: 26.3 % — AB (ref 36.0–46.0)
Hemoglobin: 8.7 g/dL — ABNORMAL LOW (ref 12.0–15.0)
MCH: 30.7 pg (ref 26.0–34.0)
MCHC: 33.1 g/dL (ref 30.0–36.0)
MCV: 92.9 fL (ref 78.0–100.0)
PLATELETS: 268 10*3/uL (ref 150–400)
RBC: 2.83 MIL/uL — ABNORMAL LOW (ref 3.87–5.11)
RDW: 14.5 % (ref 11.5–15.5)
WBC: 9.4 10*3/uL (ref 4.0–10.5)

## 2013-07-20 LAB — GLUCOSE, CAPILLARY: GLUCOSE-CAPILLARY: 160 mg/dL — AB (ref 70–99)

## 2013-07-20 MED ORDER — HYDROMORPHONE HCL 2 MG PO TABS: 2.0000 mg | ORAL_TABLET | Freq: Four times a day (QID) | ORAL | Status: DC | PRN

## 2013-07-20 NOTE — Progress Notes (Signed)
Physical Therapy Treatment Patient Details Name: Amanda [name] MRN: [id number] DOB: [birth year] Today's Date: 07/20/2013    History of Present Illness Pt admitted for R TKA    PT Comments    Pt tolerated treatment well and is functioning at at supervision to Mod I level. She has completed stair training, all pertinent education has been reviewed and she has no further questions. Pt is adequate for d/c from a PT standpoint. Patient will continue to benefit from skilled physical therapy at home with HHPT services to further improve independence with functional mobility.   Follow Up Recommendations  Home health PT     Equipment Recommendations  Rolling walker with 5" wheels    Recommendations for Other Services       Precautions / Restrictions Precautions Precautions: Knee Restrictions Weight Bearing Restrictions: Yes RLE Weight Bearing: Weight bearing as tolerated    Mobility  Bed Mobility Overal bed mobility: Modified Independent                Transfers Overall transfer level: Modified independent                  Ambulation/Gait Ambulation/Gait assistance: Supervision Ambulation Distance (Feet): 250 Feet Assistive device: Rolling walker (2 wheeled) Gait Pattern/deviations: Step-through pattern     General Gait Details: Ambulates well with step-through pattern, no loss of balance noted. VCs for forward gaze.   Stairs Stairs: Yes Stairs assistance: Supervision Stair Management: Two rails;Step to pattern;Forwards Number of Stairs: 2 (x2) General stair comments: Educated on safe stair navigation using 2 rails similar to home environment. Pt performed this task safely and all questions were answered. She states she feels comfortable with this task.  Wheelchair Mobility    Modified Rankin (Stroke Patients Only)       Balance                                    Cognition Arousal/Alertness: Awake/alert Behavior During Therapy: WFL  for tasks assessed/performed Overall Cognitive Status: Within Functional Limits for tasks assessed                      Exercises      General Comments        Pertinent Vitals/Pain No complaints of pain during PT session. No numerical value given Patient repositioned in bed for comfort.     Home Living                      Prior Function            PT Goals (current goals can now be found in the care plan section) Progress towards PT goals: Progressing toward goals    Frequency  7X/week    PT Plan Current plan remains appropriate    Co-evaluation             End of Session   Activity Tolerance: Patient tolerated treatment well Patient left: with call bell/phone within reach;in bed;with family/visitor present     Time: 0865-7846 PT Time Calculation (min): 10 min  Charges:  $Gait Training: 8-22 mins                    G Codes:      [name], [place]  Ellouise Newer 07/20/2013, 12:36 PM

## 2013-07-20 NOTE — Progress Notes (Signed)
Subjective: 2 Days Post-Op Procedure(s) (LRB): TOTAL KNEE ARTHROPLASTY (Right) Patient reports pain as 3 on 0-10 scale. Making good progress with physical therapy. She is having some itching with Percocet requiring Benadryl. She would like to try a different pain medication on discharge. Taking by mouth/voiding okay . No dizziness.  Objective: Vital signs in last 24 hours: Temp:  [98.8 F (37.1 C)-100.9 F (38.3 C)] 98.8 F (37.1 C) (06/14 0549) Pulse Rate:  [90-96] 90 (06/14 0549) Resp:  [18] 18 (06/14 0549) BP: (123-158)/(39-61) 123/39 mmHg (06/14 0549) SpO2:  [96 %-100 %] 96 % (06/14 0549) Weight:  [87.544 kg (193 lb)] 87.544 kg (193 lb) (06/13 1625)  Intake/Output from previous day: 06/13 0701 - 06/14 0700 In: 1280 [P.O.:1280] Out: -  Intake/Output this shift:     Recent Labs  07/19/13 0405 07/20/13 0545  HGB 9.6* 8.7*    Recent Labs  07/19/13 0405 07/20/13 0545  WBC 8.9 9.4  RBC 3.15* 2.83*  HCT 29.4* 26.3*  PLT 293 268    Recent Labs  07/19/13 0405  NA 137  K 4.6  CL 100  CO2 26  BUN 11  CREATININE 0.83  GLUCOSE 135*  CALCIUM 9.1   Right knee exam Neurovascular intact Sensation intact distally Intact pulses distally Dorsiflexion/Plantar flexion intact Incision: no drainage Compartment soft  Assessment/Plan: 2 Days Post-Op Procedure(s) (LRB): TOTAL KNEE ARTHROPLASTY (Right) Acute blood loss anemia   asymptomatic Plan: She will continue on multivitamins with iron at home. Dressing changed. Will switch from Percocet to Dilaudid. Discharge home with home health Followup with Dr. Berenice Primas in 2 weeks.  Ahmadou Bolz G 07/20/2013, 9:49 AM

## 2013-07-20 NOTE — Discharge Summary (Signed)
Patient ID: Amanda [name] MRN: [id number] DOB/AGE: 1945-12-27 68 y.o.  Admit date: 07/18/2013 Discharge date: 07/20/2013  Admission Diagnoses:  Principal Problem:   Osteoarthritis of right knee Active Problems:   Type II or unspecified type diabetes mellitus without mention of complication, uncontrolled   Osteoarthritis of left knee   Acute blood loss anemia   Discharge Diagnoses:  Same Acute blood loss anemia.  Past Medical History  Diagnosis Date  . Diabetes mellitus without complication   . GERD (gastroesophageal reflux disease)     Pt takes OTC when needed.  . Arthritis   . Carpal tunnel syndrome     Surgeries: Procedure(s): Right TOTAL KNEE ARTHROPLASTY on 07/18/2013   Discharged Condition: Improved  Hospital Course: Amanda [name] is an 68 y.o. female who was admitted 07/18/2013 for operative treatment ofOsteoarthritis of right knee. Patient has severe unremitting pain that affects sleep, daily activities, and work/hobbies. After pre-op clearance the patient was taken to the operating room on 07/18/2013 and underwent  Procedure(s): Right TOTAL KNEE ARTHROPLASTY.    Patient was given perioperative antibiotics:     Anti-infectives   Start     Dose/Rate Route Frequency Ordered Stop   07/18/13 1500  ceFAZolin (ANCEF) IVPB 2 g/50 mL premix     2 g 100 mL/hr over 30 Minutes Intravenous Every 6 hours 07/18/13 1053 07/18/13 2127   07/18/13 0600  ceFAZolin (ANCEF) IVPB 2 g/50 mL premix     2 g 100 mL/hr over 30 Minutes Intravenous On call to O.R. 07/17/13 1452 07/18/13 0748       Patient was given sequential compression devices, early ambulation, and chemoprophylaxis to prevent DVT.  Patient benefited maximally from hospital stay and there were no complications. She had a decreased hemoglobin to 8.7 on postoperative day #2, but was asymptomatic. She had some itching with Percocet so she was switched to oral Dilaudid at discharge  Recent vital signs:  Patient Vitals for the  past 24 hrs:  BP Temp Temp src Pulse Resp SpO2 Height Weight  07/20/13 0549 123/39 mmHg 98.8 F (37.1 C) Oral 90 18 96 % - -  07/19/13 2045 158/61 mmHg 100.1 F (37.8 C) Oral 93 18 100 % - -  07/19/13 1817 143/60 mmHg 100.9 F (38.3 C) Oral 96 18 96 % - -  07/19/13 1625 - - - - - - 5\' 2"  (1.575 m) 87.544 kg (193 lb)     Recent laboratory studies:   Recent Labs  07/19/13 0405 07/20/13 0545  WBC 8.9 9.4  HGB 9.6* 8.7*  HCT 29.4* 26.3*  PLT 293 268  NA 137  --   K 4.6  --   CL 100  --   CO2 26  --   BUN 11  --   CREATININE 0.83  --   GLUCOSE 135*  --   CALCIUM 9.1  --      Discharge Medications:     Medication List    STOP taking these medications       aspirin 81 MG tablet  Replaced by:  aspirin EC 325 MG tablet      TAKE these medications       aspirin EC 325 MG tablet  Take 1 tablet (325 mg total) by mouth 2 (two) times daily after a meal.     estrogens (conjugated) 0.625 MG tablet  Commonly known as:  PREMARIN  Take 0.625 mg by mouth daily. Take daily for 21 days then do not take for 7 days.  fexofenadine 180 MG tablet  Commonly known as:  ALLEGRA  Take 180 mg by mouth daily as needed for allergies or rhinitis.     glipiZIDE 2.5 MG 24 hr tablet  Commonly known as:  GLUCOTROL XL  Take 2.5 mg by mouth daily with breakfast.     glucose blood test strip  Commonly known as:  ACCU-CHEK AVIVA PLUS  Use as instructed to check blood sugar once a day     HYDROmorphone 2 MG tablet  Commonly known as:  DILAUDID  Take 1-2 tablets (2-4 mg total) by mouth every 6 (six) hours as needed for severe pain.     Insulin Pen Needle 32G X 4 MM Misc  Commonly known as:  BD PEN NEEDLE NANO U/F  Use as directed     methocarbamol 750 MG tablet  Commonly known as:  ROBAXIN-750  Take 1 tablet (750 mg total) by mouth every 8 (eight) hours as needed for muscle spasms.     multivitamin with minerals tablet  Take 1 tablet by mouth daily.     rosuvastatin 10 MG tablet   Commonly known as:  CRESTOR  Take 10 mg by mouth daily.     TRULICITY 3.66 YQ/0.3KV Sopn  Generic drug:  Dulaglutide  Inject 0.75 mg into the skin once a week.     Vitamin D (Ergocalciferol) 50000 UNITS Caps capsule  Commonly known as:  DRISDOL  Take 50,000 Units by mouth every 7 (seven) days.        Diagnostic Studies: Dg Chest 2 View  07/10/2013   CLINICAL DATA:  Preoperative evaluation for total knee replacement, diabetes  EXAM: CHEST  2 VIEW  COMPARISON:  12/28/2010  FINDINGS: The heart size and mediastinal contours are within normal limits. Both lungs are clear. The visualized skeletal structures are unremarkable.  IMPRESSION: No active cardiopulmonary disease.   Electronically Signed   By: Daryll Brod M.D.   On: 07/10/2013 16:39    Disposition: 01-Home or Self Care  Discharge Instructions   CPM    Complete by:  As directed   Continuous passive motion machine (CPM):      Use the CPM from 0 degrees to 70 degrees for 8 hours per day.      You may increase by 5 degrees per day.  You may break it up into 2 or 3 sessions per day.      Use CPM for 1-2 weeks or until you are told to stop.     Call MD / Call 911    Complete by:  As directed   If you experience chest pain or shortness of breath, CALL 911 and be transported to the hospital emergency room.  If you develope a fever above 101 F, pus (white drainage) or increased drainage or redness at the wound, or calf pain, call your surgeon's office.     Constipation Prevention    Complete by:  As directed   Drink plenty of fluids.  Prune juice may be helpful.  You may use a stool softener, such as Colace (over the counter) 100 mg twice a day.  Use MiraLax (over the counter) for constipation as needed.     Diet Carb Modified    Complete by:  As directed      Do not put a pillow under the knee. Place it under the heel.    Complete by:  As directed      Increase activity slowly as tolerated    Complete by:  As directed      Weight  bearing as tolerated    Complete by:  As directed            Follow-up Information   Follow up with GRAVES,JOHN L, MD. Schedule an appointment as soon as possible for a visit in 2 weeks.   Specialty:  Orthopedic Surgery   Contact information:   [place] [address] [phone]       Follow up with [facility]. (Someone from [facility] will contact you concerning start date and time for physical  therapy)    Contact information:   [address] [place] [address] [phone]        Signed: Erlene Senters 07/20/2013, 9:58 AM

## 2013-07-21 ENCOUNTER — Encounter (HOSPITAL_COMMUNITY): Payer: Self-pay | Admitting: Orthopedic Surgery

## 2013-09-10 ENCOUNTER — Telehealth: Payer: Self-pay

## 2013-09-10 NOTE — Telephone Encounter (Signed)
Diabetic Bundle pt scheduled for appointment with Dr. Dwyane Dee on 09/30/2013.

## 2013-09-26 ENCOUNTER — Telehealth: Payer: Self-pay | Admitting: Endocrinology

## 2013-09-26 ENCOUNTER — Other Ambulatory Visit: Payer: Self-pay | Admitting: *Deleted

## 2013-09-26 MED ORDER — DULAGLUTIDE 0.75 MG/0.5ML ~~LOC~~ SOAJ: 0.7500 mg | SUBCUTANEOUS | Status: DC

## 2013-09-26 NOTE — Telephone Encounter (Signed)
Patient need med Trulicity 5.[facility]. And could she have a box until she get her prescriptions.

## 2013-09-30 ENCOUNTER — Other Ambulatory Visit (INDEPENDENT_AMBULATORY_CARE_PROVIDER_SITE_OTHER): Payer: Medicare Other

## 2013-09-30 DIAGNOSIS — E1165 Type 2 diabetes mellitus with hyperglycemia: Principal | ICD-10-CM

## 2013-09-30 DIAGNOSIS — IMO0001 Reserved for inherently not codable concepts without codable children: Secondary | ICD-10-CM

## 2013-09-30 LAB — URINALYSIS, ROUTINE W REFLEX MICROSCOPIC
Bilirubin Urine: NEGATIVE
Hgb urine dipstick: NEGATIVE
Ketones, ur: NEGATIVE
LEUKOCYTES UA: NEGATIVE
NITRITE: NEGATIVE
PH: 5.5 (ref 5.0–8.0)
Specific Gravity, Urine: <=1.005 — AB (ref 1.000–1.030)
Total Protein, Urine: NEGATIVE
Urine Glucose: NEGATIVE
Urobilinogen, UA: 0.2 (ref 0.0–1.0)

## 2013-09-30 LAB — LIPID PANEL
Cholesterol: 189 mg/dL (ref 0–200)
HDL: 58.3 mg/dL (ref >39.00)
LDL Cholesterol: 105 mg/dL — ABNORMAL HIGH (ref 0–99)
NonHDL: 130.7
Total CHOL/HDL Ratio: 3
Triglycerides: 131 mg/dL (ref 0.0–149.0)
VLDL: 26.2 mg/dL (ref 0.0–40.0)

## 2013-09-30 LAB — COMPREHENSIVE METABOLIC PANEL
ALK PHOS: 53 U/L (ref 39–117)
ALT: 8 U/L (ref 0–35)
AST: 15 U/L (ref 0–37)
Albumin: 3.7 g/dL (ref 3.5–5.2)
BUN: 12 mg/dL (ref 6–23)
CO2: 30 meq/L (ref 19–32)
CREATININE: 0.8 mg/dL (ref 0.4–1.2)
Calcium: 9.9 mg/dL (ref 8.4–10.5)
Chloride: 103 mEq/L (ref 96–112)
GFR: 92.99 mL/min (ref >60.00)
Glucose, Bld: 90 mg/dL (ref 70–99)
Potassium: 4.3 mEq/L (ref 3.5–5.1)
Sodium: 139 mEq/L (ref 135–145)
Total Bilirubin: 0.1 mg/dL — ABNORMAL LOW (ref 0.2–1.2)
Total Protein: 7.6 g/dL (ref 6.0–8.3)

## 2013-09-30 LAB — HEMOGLOBIN A1C: HEMOGLOBIN A1C: 6.6 % — AB (ref 4.6–6.5)

## 2013-09-30 LAB — LDL CHOLESTEROL, DIRECT: Direct LDL: 120.1 mg/dL

## 2013-09-30 LAB — MICROALBUMIN / CREATININE URINE RATIO
Creatinine,U: 66.2 mg/dL
MICROALB/CREAT RATIO: 0.2 mg/g (ref 0.0–30.0)
Microalb, Ur: 0.1 mg/dL (ref 0.0–1.9)

## 2013-10-03 ENCOUNTER — Ambulatory Visit: Payer: Medicare Other | Admitting: Endocrinology

## 2013-10-06 ENCOUNTER — Encounter: Payer: Self-pay | Admitting: Endocrinology

## 2013-10-06 ENCOUNTER — Ambulatory Visit (INDEPENDENT_AMBULATORY_CARE_PROVIDER_SITE_OTHER): Payer: Medicare Other | Admitting: Endocrinology

## 2013-10-06 VITALS — BP 134/72 | HR 81 | Temp 98.1°F | Resp 14 | Ht 62.0 in | Wt 173.0 lb

## 2013-10-06 DIAGNOSIS — E119 Type 2 diabetes mellitus without complications: Secondary | ICD-10-CM

## 2013-10-06 DIAGNOSIS — E785 Hyperlipidemia, unspecified: Secondary | ICD-10-CM

## 2013-10-06 NOTE — Progress Notes (Signed)
Amanda [name] is an 68 y.o. female.    Reason for Appointment: Diabetes follow-up   History of Present Illness   Diagnosis: Type 2 DIABETES MELITUS     Previous history: She has had difficulty with blood sugar control initially because of limited tolerability with metformin and difficulty with weight loss. She did very well with starting Victoza which has helped her blood sugar control as well as some improvement in her obesity  Recent history:   Her insurance company did not prefer Victoza and she was switched to Trulicity This was done in 4/15 but she was having nausea with 1.5 mg Trulicity. Has been taking 0.75 subsequently She is also taking low-dose glipizide ER Recently has been seen in blood sugar mostly in the mornings and these are relatively better than her last visit Also her A1c is now below 7%, previously 7-7.2 She has lost a significant amount of weight since her knee surgery in June and she thinks her appetite is less Overall has done better with her diet compared to her last visit She is not actively exercising but has been in physical therapy     Oral hypoglycemic drugs: Glipizide ER 2.5       Side effects from medications: Diarrhea from metformin        Monitors blood glucose: Once a day.    Glucometer:  Accu-Chek Aviva         Blood Glucose readings from meter download:   PREMEAL Breakfast Lunch Dinner Bedtime Overall  Glucose range:  102-130   92-97      Mean/median:  111      [address]       Hypoglycemia frequency:  none                Physical activity: exercise: not much because of knee pain. She thinks she is doing some arm exercises            Wt Readings from Last 3 Encounters:  10/06/13 173 lb (78.472 kg)  07/19/13 193 lb (87.544 kg)  07/19/13 193 lb (87.544 kg)    LABS:  Lab Results  Component Value Date   HGBA1C 6.6* 09/30/2013   HGBA1C 7.2* 07/01/2013   HGBA1C 7.0* 03/24/2013   Lab Results  Component Value Date   MICROALBUR 0.1 09/30/2013   LDLCALC 105* 09/30/2013   CREATININE 0.8 09/30/2013     Appointment on 09/30/2013  Component Date Value Ref Range Status  . Hemoglobin A1C 09/30/2013 6.6* 4.6 - 6.5 % Final   Glycemic Control Guidelines for People with Diabetes:Non Diabetic:  <6%Goal of Therapy: <7%Additional Action Suggested:  >8%   . Sodium 09/30/2013 139  135 - 145 mEq/L Final  . Potassium 09/30/2013 4.3  3.5 - 5.1 mEq/L Final  . Chloride 09/30/2013 103  96 - 112 mEq/L Final  . CO2 09/30/2013 30  19 - 32 mEq/L Final  . Glucose, Bld 09/30/2013 90  70 - 99 mg/dL Final  . BUN 09/30/2013 12  6 - 23 mg/dL Final  . Creatinine, Ser 09/30/2013 0.8  0.4 - 1.2 mg/dL Final  . Total Bilirubin 09/30/2013 0.1* 0.2 - 1.2 mg/dL Final  . Alkaline Phosphatase 09/30/2013 53  39 - 117 U/L Final  . AST 09/30/2013 15  0 - 37 U/L Final  . ALT 09/30/2013 8  0 - 35 U/L Final  . Total Protein 09/30/2013 7.6  6.0 - 8.3 g/dL Final  . Albumin 09/30/2013 3.7  3.5 - 5.2 g/dL Final  .  Calcium 09/30/2013 9.9  8.4 - 10.5 mg/dL Final  . GFR 09/30/2013 92.99  >60.00 mL/min Final  . Cholesterol 09/30/2013 189  0 - 200 mg/dL Final   ATP III Classification       Desirable:  < 200 mg/dL               Borderline High:  200 - 239 mg/dL          High:  > = 240 mg/dL  . Triglycerides 09/30/2013 131.0  0.0 - 149.0 mg/dL Final   Normal:  <150 mg/dLBorderline High:  150 - 199 mg/dL  . HDL 09/30/2013 58.30  >39.00 mg/dL Final  . VLDL 09/30/2013 26.2  0.0 - 40.0 mg/dL Final  . LDL Cholesterol 09/30/2013 105* 0 - 99 mg/dL Final  . Total CHOL/HDL Ratio 09/30/2013 3   Final                  Men          Women1/2 Average Risk     3.4          3.3Average Risk          5.0          4.42X Average Risk          9.6          7.13X Average Risk          15.0          11.0                      . NonHDL 09/30/2013 130.70   Final   NOTE:  Non-HDL goal should be 30 mg/dL higher than patient's LDL goal (i.e. LDL goal of < 70 mg/dL, would have non-HDL goal of < 100 mg/dL)  .  Direct LDL 09/30/2013 120.1   Final   Optimal:  <100 mg/dLNear or Above Optimal:  100-129 mg/dLBorderline High:  130-159 mg/dLHigh:  160-189 mg/dLVery High:  >190 mg/dL  . Microalb, Ur 09/30/2013 0.1  0.0 - 1.9 mg/dL Final  . Creatinine,U 09/30/2013 66.2   Final  . Microalb Creat Ratio 09/30/2013 0.2  0.0 - 30.0 mg/g Final  . Color, Urine 09/30/2013 YELLOW  Yellow;Lt. Yellow Final  . APPearance 09/30/2013 CLEAR  Clear Final  . Specific Gravity, Urine 09/30/2013 <=1.005* 1.000 - 1.030 Final  . pH 09/30/2013 5.5  5.0 - 8.0 Final  . Total Protein, Urine 09/30/2013 NEGATIVE  Negative Final  . Urine Glucose 09/30/2013 NEGATIVE  Negative Final  . Ketones, ur 09/30/2013 NEGATIVE  Negative Final  . Bilirubin Urine 09/30/2013 NEGATIVE  Negative Final  . Hgb urine dipstick 09/30/2013 NEGATIVE  Negative Final  . Urobilinogen, UA 09/30/2013 0.2  0.0 - 1.0 Final  . Leukocytes, UA 09/30/2013 NEGATIVE  Negative Final  . Nitrite 09/30/2013 NEGATIVE  Negative Final  . WBC, UA 09/30/2013 0-2/hpf  0-2/hpf Final  . RBC / HPF 09/30/2013 0-2/hpf  0-2/hpf Final  . Squamous Epithelial / LPF 09/30/2013 Few(5-10/hpf)* Rare(0-4/hpf) Final      Medication List       This list is accurate as of: 10/06/13  4:51 PM.  Always use your most recent med list.               aspirin EC 325 MG tablet  Take 1 tablet (325 mg total) by mouth 2 (two) times daily after a meal.     Dulaglutide 0.75 MG/0.5ML Sopn  Commonly known as:  TRULICITY  Inject 5.42 mg into the skin once a week.     estrogens (conjugated) 0.625 MG tablet  Commonly known as:  PREMARIN  Take 0.625 mg by mouth daily. Take daily for 21 days then do not take for 7 days.     fexofenadine 180 MG tablet  Commonly known as:  ALLEGRA  Take 180 mg by mouth daily as needed for allergies or rhinitis.     glipiZIDE 2.5 MG 24 hr tablet  Commonly known as:  GLUCOTROL XL  Take 2.5 mg by mouth daily with breakfast.     glucose blood test strip  Commonly  known as:  ACCU-CHEK AVIVA PLUS  Use as instructed to check blood sugar once a day     Insulin Pen Needle 32G X 4 MM Misc  Commonly known as:  BD PEN NEEDLE NANO U/F  Use as directed     multivitamin with minerals tablet  Take 1 tablet by mouth daily.     rosuvastatin 10 MG tablet  Commonly known as:  CRESTOR  Take 10 mg by mouth daily.     Vitamin D (Ergocalciferol) 50000 UNITS Caps capsule  Commonly known as:  DRISDOL  Take 50,000 Units by mouth every 7 (seven) days.        Allergies:  Allergies  Allergen Reactions  . Meperidine Hcl     REACTION: rash  . Statins Other (See Comments)    Muscle pain. Pt able to take Crestor but not everyday.    Past Medical History  Diagnosis Date  . Diabetes mellitus without complication   . GERD (gastroesophageal reflux disease)     Pt takes OTC when needed.  . Arthritis   . Carpal tunnel syndrome     Past Surgical History  Procedure Laterality Date  . Shoulder surgery Right   . Appendectomy    . Abdominal hysterectomy    . Tonsillectomy    . Eye surgery Bilateral     Cataracts removed  . Colonoscopy    . Esophagogastroduodenoscopy    . 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 Total knee arthroplasty Right 07/18/2013    Procedure: TOTAL KNEE ARTHROPLASTY;  Surgeon: Alta Corning, MD;  Location: [facility];  Service: Orthopedics;  Laterality: Right;    No family history on file.  Social History:  reports that she has never smoked. She has never used smokeless tobacco. She reports that she does not drink alcohol or use illicit drugs.  Review of Systems:  No history of hypertension. Renal function is normal   Lipids:  She in on Crestor from PCP. She thinks she has had joint pains from Crestor and also Lipitor. She is  able to take this about 2-3 times a week with improvement in her LDL although still not below 100, baseline LDL had been over 200    Lab Results  Component Value Date   CHOL 189 09/30/2013    HDL 58.30 09/30/2013   LDLCALC 105* 09/30/2013   LDLDIRECT 120.1 09/30/2013   TRIG 131.0 09/30/2013   CHOLHDL 3 09/30/2013    She is recovering from her knee surgery and is not completely pain-free at present  Foot exam done in 10/14   Examination:     BP 134/72  Pulse 81  Temp(Src) 98.1 F (36.7 C)  Resp 14  Ht 5\' 2"  (1.575 m)  Wt 173 lb (78.472 kg)  BMI 31.63 kg/m2  SpO2 95%  Body mass index is  31.63 kg/(m^2).    ASSESSMENT/ PLAN:  Diabetes type 2   Blood glucose control is overall  good with mild improvement in A1c from before She also has had a significant amount of weight loss mostly from her decreased appetite and allowing her diet better Also is a little more active since her knee surgery She has done well with low-dose Trulicity and will continue Since her blood sugars are fairly normal and only minimally elevated early in the morning we'll try to leave off her glipizide ER Needs to check more readings after meals instead of in the morning only Encouraged her to start exercising when she can  Hypercholesterolemia: Reasonably good control considering she is intolerant to daily statin drug regimen  Amanda [name] 10/06/2013, 4:51 PM

## 2013-10-06 NOTE — Patient Instructions (Signed)
Leave off Glipizide   Please check blood sugars at least half the time about 2 hours after any meal and 2 times per week on waking up. Please bring blood sugar monitor to each visit

## 2013-12-24 ENCOUNTER — Other Ambulatory Visit: Payer: Medicare Other

## 2013-12-31 ENCOUNTER — Other Ambulatory Visit (INDEPENDENT_AMBULATORY_CARE_PROVIDER_SITE_OTHER): Payer: Medicare Other

## 2013-12-31 ENCOUNTER — Other Ambulatory Visit: Payer: Medicare Other

## 2013-12-31 DIAGNOSIS — E119 Type 2 diabetes mellitus without complications: Secondary | ICD-10-CM

## 2013-12-31 LAB — BASIC METABOLIC PANEL
BUN: 18 mg/dL (ref 6–23)
CO2: 28 mEq/L (ref 19–32)
CREATININE: 0.7 mg/dL (ref 0.4–1.2)
Calcium: 9.6 mg/dL (ref 8.4–10.5)
Chloride: 102 mEq/L (ref 96–112)
GFR: 106.84 mL/min (ref >60.00)
Glucose, Bld: 106 mg/dL — ABNORMAL HIGH (ref 70–99)
Potassium: 4.3 mEq/L (ref 3.5–5.1)
Sodium: 138 mEq/L (ref 135–145)

## 2013-12-31 LAB — HEMOGLOBIN A1C: Hgb A1c MFr Bld: 7.1 % — ABNORMAL HIGH (ref 4.6–6.5)

## 2014-01-05 ENCOUNTER — Other Ambulatory Visit: Payer: Self-pay | Admitting: *Deleted

## 2014-01-05 ENCOUNTER — Encounter: Payer: Self-pay | Admitting: Endocrinology

## 2014-01-05 ENCOUNTER — Ambulatory Visit (INDEPENDENT_AMBULATORY_CARE_PROVIDER_SITE_OTHER): Payer: Medicare Other | Admitting: Endocrinology

## 2014-01-05 VITALS — BP 118/62 | HR 75 | Temp 97.8°F | Resp 14 | Ht 62.0 in | Wt 180.0 lb

## 2014-01-05 DIAGNOSIS — IMO0002 Reserved for concepts with insufficient information to code with codable children: Secondary | ICD-10-CM

## 2014-01-05 DIAGNOSIS — E785 Hyperlipidemia, unspecified: Secondary | ICD-10-CM

## 2014-01-05 DIAGNOSIS — E1165 Type 2 diabetes mellitus with hyperglycemia: Secondary | ICD-10-CM

## 2014-01-05 MED ORDER — DULAGLUTIDE 0.75 MG/0.5ML ~~LOC~~ SOAJ: 0.7500 mg | SUBCUTANEOUS | Status: DC

## 2014-01-05 NOTE — Progress Notes (Signed)
Amanda [name] is an 68 y.o. female.    Reason for Appointment: Diabetes follow-up   History of Present Illness   Diagnosis: Type 2 DIABETES MELITUS     Previous history: She has had difficulty with blood sugar control initially because of limited tolerability with metformin and difficulty with weight loss. She did very well with starting Victoza which has helped her blood sugar control as well as some improvement in her obesity Her insurance company did not prefer Victoza and she was switched to Trulicity  Recent history:   She was having nausea with 1.5 mg Trulicity. Has been taking 0.75 subsequently She is also taking low-dose glipizide ER which was stopped on her last visit since her blood sugars were relatively low and she was eating less Recently has fairly good blood sugars although relatively higher in the last 2 days in the mornings She has however started gaining back some weight Has not started walking or exercising as discussed She thinks she is usually fairly compliant with her diet, portion control has not been consistent however A1c has increased since her last visit but higher than expected     Oral hypoglycemic drugs: Glipizide ER 2.5       Side effects from medications: Diarrhea from metformin        Monitors blood glucose: Once a day.    Glucometer:  Accu-Chek Aviva         Blood Glucose readings from meter download:   FASTING glucose: 99-136, nonfasting 94-108 Fasting average 117 and overall average 110   Hypoglycemia frequency:  none                Physical activity: exercise: not much             Wt Readings from Last 3 Encounters:  01/05/14 180 lb (81.647 kg)  10/06/13 173 lb (78.472 kg)  07/19/13 193 lb (87.544 kg)    LABS:  Lab Results  Component Value Date   HGBA1C 7.1* 12/31/2013   HGBA1C 6.6* 09/30/2013   HGBA1C 7.2* 07/01/2013   Lab Results  Component Value Date   MICROALBUR 0.1 09/30/2013   LDLCALC 105* 09/30/2013   CREATININE 0.7  12/31/2013     Appointment on 12/31/2013  Component Date Value Ref Range Status  . Hgb A1c MFr Bld 12/31/2013 7.1* 4.6 - 6.5 % Final   Glycemic Control Guidelines for People with Diabetes:Non Diabetic:  <6%Goal of Therapy: <7%Additional Action Suggested:  >8%   . Sodium 12/31/2013 138  135 - 145 mEq/L Final  . Potassium 12/31/2013 4.3  3.5 - 5.1 mEq/L Final  . Chloride 12/31/2013 102  96 - 112 mEq/L Final  . CO2 12/31/2013 28  19 - 32 mEq/L Final  . Glucose, Bld 12/31/2013 106* 70 - 99 mg/dL Final  . BUN 12/31/2013 18  6 - 23 mg/dL Final  . Creatinine, Ser 12/31/2013 0.7  0.4 - 1.2 mg/dL Final  . Calcium 12/31/2013 9.6  8.4 - 10.5 mg/dL Final  . GFR 12/31/2013 106.84  >60.00 mL/min Final      Medication List       This list is accurate as of: 01/05/14 10:41 AM.  Always use your most recent med list.               aspirin EC 325 MG tablet  Take 1 tablet (325 mg total) by mouth 2 (two) times daily after a meal.     Dulaglutide 0.75 MG/0.5ML Sopn  Commonly known as:  TRULICITY  Inject 7.82 mg into the skin once a week.     estrogens (conjugated) 0.625 MG tablet  Commonly known as:  PREMARIN  Take 0.625 mg by mouth daily. Take daily for 21 days then do not take for 7 days.     fexofenadine 180 MG tablet  Commonly known as:  ALLEGRA  Take 180 mg by mouth daily as needed for allergies or rhinitis.     glipiZIDE 2.5 MG 24 hr tablet  Commonly known as:  GLUCOTROL XL  Take 2.5 mg by mouth daily with breakfast.     glucose blood test strip  Commonly known as:  ACCU-CHEK AVIVA PLUS  Use as instructed to check blood sugar once a day     Insulin Pen Needle 32G X 4 MM Misc  Commonly known as:  BD PEN NEEDLE NANO U/F  Use as directed     multivitamin with minerals tablet  Take 1 tablet by mouth daily.     rosuvastatin 10 MG tablet  Commonly known as:  CRESTOR  Take 10 mg by mouth daily.     Vitamin D (Ergocalciferol) 50000 UNITS Caps capsule  Commonly known as:   DRISDOL  Take 50,000 Units by mouth every 7 (seven) days.        Allergies:  Allergies  Allergen Reactions  . Meperidine Hcl     REACTION: rash  . Statins Other (See Comments)    Muscle pain. Pt able to take Crestor but not everyday.    Past Medical History  Diagnosis Date  . Diabetes mellitus without complication   . GERD (gastroesophageal reflux disease)     Pt takes OTC when needed.  . Arthritis   . Carpal tunnel syndrome     Past Surgical History  Procedure Laterality Date  . Shoulder surgery Right   . Appendectomy    . Abdominal hysterectomy    . Tonsillectomy    . Eye surgery Bilateral     Cataracts removed  . Colonoscopy    . Esophagogastroduodenoscopy    . 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  Social History:  reports that she has never smoked. She has never used smokeless tobacco. She reports that she does not drink alcohol or use illicit drugs.  Review of Systems:  No history of hypertension. Renal function is normal   Lipids:  She in on Crestor from PCP. She thinks she has had joint pains from Crestor and also Lipitor. She is  able to take this about 2-3 times a week with improvement in her LDL although still not below 100, baseline LDL had been over 200    Lab Results  Component Value Date   CHOL 189 09/30/2013   HDL 58.30 09/30/2013   LDLCALC 105* 09/30/2013   LDLDIRECT 120.1 09/30/2013   TRIG 131.0 09/30/2013   CHOLHDL 3 09/30/2013    She is recovering from her knee surgery and is doing much better  Foot exam done in 11/15   Examination:  Diabetic foot exam shows normal monofilament sensation in the toes and plantar surfaces, no skin lesions or ulcers on the feet and normal pedal pulses    BP 118/62 mmHg  Pulse 75   Temp(Src) 97.8 F (36.6 C)  Resp 14  Ht 5\' 2"  (1.575 m)  Wt 180 lb (81.647 kg)  BMI 32.91 kg/m2  SpO2 98%  Body mass index is 32.91 kg/(m^2).    ASSESSMENT/ PLAN:  Diabetes type 2   Blood glucose control is overall fair with good readings at home although A1c is higher than expected and over 7% on low-dose Trulicity alone However she does not have consistently high readings and occasionally readings are below 100; will not start glipizide as yet and she is reluctant to consider changing to Victoza Previously intolerant to metformin, even 500 mg Needs to check more readings after meals which she has not done recently Encouraged her to start exercising with regular walking  Hypercholesterolemia:  She will need follow-up labs    [facility] 01/05/2014, 10:41 AM

## 2014-01-05 NOTE — Patient Instructions (Signed)
Please check blood sugars at least half the time about 2 hours after any meal and times per week on waking up. Please bring blood sugar monitor to each visit  More walking

## 2014-01-13 ENCOUNTER — Telehealth: Payer: Self-pay | Admitting: Internal Medicine

## 2014-01-13 NOTE — Telephone Encounter (Signed)
Pt is dr Ronnie Derby pt my mistake.  She has not had a flu shot.

## 2014-03-10 ENCOUNTER — Other Ambulatory Visit: Payer: Self-pay | Admitting: *Deleted

## 2014-03-10 ENCOUNTER — Telehealth: Payer: Self-pay | Admitting: Endocrinology

## 2014-03-10 MED ORDER — GLUCOSE BLOOD VI STRP: ORAL_STRIP | Status: DC

## 2014-03-10 NOTE — Telephone Encounter (Signed)
Pt needs aviva test strips called into [facility] # [id number]

## 2014-03-11 MED ORDER — GLUCOSE BLOOD VI STRP: ORAL_STRIP | Status: DC

## 2014-03-24 ENCOUNTER — Telehealth: Payer: Self-pay | Admitting: Endocrinology

## 2014-03-24 MED ORDER — ONETOUCH DELICA LANCETS 33G MISC: Status: DC

## 2014-03-24 MED ORDER — GLUCOSE BLOOD VI STRP: ORAL_STRIP | Status: DC

## 2014-03-24 NOTE — Telephone Encounter (Signed)
Patient called and would like for her one touch test strips to [facility]    Thank you

## 2014-03-24 NOTE — Telephone Encounter (Signed)
Called pt and clarified which one touch meter. Pt advised One Touch Ultra meter and she needed lancets as well. Done.

## 2014-03-30 ENCOUNTER — Other Ambulatory Visit: Payer: Self-pay | Admitting: *Deleted

## 2014-03-30 MED ORDER — GLUCOSE BLOOD VI STRP: ORAL_STRIP | Status: DC

## 2014-04-01 ENCOUNTER — Other Ambulatory Visit (INDEPENDENT_AMBULATORY_CARE_PROVIDER_SITE_OTHER): Payer: Medicare Other

## 2014-04-01 DIAGNOSIS — E1165 Type 2 diabetes mellitus with hyperglycemia: Secondary | ICD-10-CM

## 2014-04-01 DIAGNOSIS — IMO0002 Reserved for concepts with insufficient information to code with codable children: Secondary | ICD-10-CM

## 2014-04-01 DIAGNOSIS — E785 Hyperlipidemia, unspecified: Secondary | ICD-10-CM

## 2014-04-01 LAB — COMPREHENSIVE METABOLIC PANEL
ALBUMIN: 4.4 g/dL (ref 3.5–5.2)
ALT: 13 U/L (ref 0–35)
AST: 17 U/L (ref 0–37)
Alkaline Phosphatase: 57 U/L (ref 39–117)
BUN: 16 mg/dL (ref 6–23)
CALCIUM: 10 mg/dL (ref 8.4–10.5)
CHLORIDE: 103 meq/L (ref 96–112)
CO2: 31 meq/L (ref 19–32)
CREATININE: 0.72 mg/dL (ref 0.40–1.20)
GFR: 103.35 mL/min (ref >60.00)
GLUCOSE: 89 mg/dL (ref 70–99)
Potassium: 4.4 mEq/L (ref 3.5–5.1)
Sodium: 138 mEq/L (ref 135–145)
Total Bilirubin: 0.4 mg/dL (ref 0.2–1.2)
Total Protein: 7.5 g/dL (ref 6.0–8.3)

## 2014-04-01 LAB — URINALYSIS, ROUTINE W REFLEX MICROSCOPIC
BILIRUBIN URINE: NEGATIVE
Hgb urine dipstick: NEGATIVE
Ketones, ur: NEGATIVE
Leukocytes, UA: NEGATIVE
Nitrite: NEGATIVE
PH: 7.5 (ref 5.0–8.0)
RBC / HPF: NONE SEEN (ref 0–?)
SPECIFIC GRAVITY, URINE: 1.015 (ref 1.000–1.030)
TOTAL PROTEIN, URINE-UPE24: NEGATIVE
Urine Glucose: NEGATIVE
Urobilinogen, UA: 0.2 (ref 0.0–1.0)
WBC, UA: NONE SEEN (ref 0–?)

## 2014-04-01 LAB — LIPID PANEL
CHOLESTEROL: 229 mg/dL — AB (ref 0–200)
HDL: 69.5 mg/dL (ref >39.00)
LDL Cholesterol: 142 mg/dL — ABNORMAL HIGH (ref 0–99)
NonHDL: 159.5
TRIGLYCERIDES: 89 mg/dL (ref 0.0–149.0)
Total CHOL/HDL Ratio: 3
VLDL: 17.8 mg/dL (ref 0.0–40.0)

## 2014-04-01 LAB — MICROALBUMIN / CREATININE URINE RATIO
Creatinine,U: 85.7 mg/dL
Microalb Creat Ratio: 0.8 mg/g (ref 0.0–30.0)
Microalb, Ur: <0.7 mg/dL (ref 0.0–1.9)

## 2014-04-01 LAB — HEMOGLOBIN A1C: HEMOGLOBIN A1C: 6.6 % — AB (ref 4.6–6.5)

## 2014-04-06 ENCOUNTER — Ambulatory Visit (INDEPENDENT_AMBULATORY_CARE_PROVIDER_SITE_OTHER): Payer: Medicare Other | Admitting: Endocrinology

## 2014-04-06 ENCOUNTER — Encounter: Payer: Self-pay | Admitting: Endocrinology

## 2014-04-06 VITALS — BP 118/80 | HR 63 | Temp 98.5°F | Ht 62.0 in | Wt 178.6 lb

## 2014-04-06 DIAGNOSIS — E785 Hyperlipidemia, unspecified: Secondary | ICD-10-CM

## 2014-04-06 DIAGNOSIS — E119 Type 2 diabetes mellitus without complications: Secondary | ICD-10-CM

## 2014-04-06 NOTE — Progress Notes (Signed)
Pre visit review using our clinic review tool, if applicable. No additional management support is needed unless otherwise documented below in the visit note. 

## 2014-04-06 NOTE — Patient Instructions (Addendum)
Check on cost of LIVALO for cholesterol  Keep up exercise

## 2014-04-06 NOTE — Progress Notes (Signed)
Amanda [name] is an 69 y.o. female.    Reason for Appointment: Diabetes follow-up   History of Present Illness   Diagnosis: Type 2 DIABETES MELITUS     Previous history: She has had difficulty with blood sugar control initially because of limited tolerability with metformin and difficulty with weight loss. She did very well with starting Victoza which has helped her blood sugar control as well as some improvement in her obesity Her insurance company did not prefer Victoza and she was switched to Trulicity  Recent history:   She was having nausea with 1.5 mg Trulicity. Has been taking 0.75 with no side effects She was also taking low-dose glipizide ER which was stopped previously since her blood sugars were relatively low and she was eating less. Not clear why her A1c was higher on her last visit even though home blood sugars were not higher and averaging about 110 However she thinks that she has been doing better on her diet overall especially with portion control and is trying to walk on her treadmill regularly  Recently has fairly good blood sugars checking at various times including after meals With this her A1c is back to 6.6 and her weight is slightly better     Oral hypoglycemic drugs:   none     Side effects from medications: Diarrhea from metformin.  Nausea from 1.5 mg Trulicity         Monitors blood glucose: Once a day.    Glucometer:  Accu-Chek Aviva         Blood Glucose readings from meter download: Recent range 94-145 at various times of the day with only one reading over 130 after supper. Average 111 out of 13 readings for the last month   Hypoglycemia frequency:  none                Physical activity: exercise: walking on treadmill            Wt Readings from Last 3 Encounters:  04/06/14 178 lb 9.6 oz (81.012 kg)  01/05/14 180 lb (81.647 kg)  10/06/13 173 lb (78.472 kg)    LABS:  Lab Results  Component Value Date   HGBA1C 6.6* 04/01/2014   HGBA1C 7.1*  12/31/2013   HGBA1C 6.6* 09/30/2013   Lab Results  Component Value Date   MICROALBUR <0.7 04/01/2014   LDLCALC 142* 04/01/2014   CREATININE 0.72 04/01/2014     Appointment on 04/01/2014  Component Date Value Ref Range Status  . Hgb A1c MFr Bld 04/01/2014 6.6* 4.6 - 6.5 % Final   Glycemic Control Guidelines for People with Diabetes:Non Diabetic:  <6%Goal of Therapy: <7%Additional Action Suggested:  >8%   . Sodium 04/01/2014 138  135 - 145 mEq/L Final  . Potassium 04/01/2014 4.4  3.5 - 5.1 mEq/L Final  . Chloride 04/01/2014 103  96 - 112 mEq/L Final  . CO2 04/01/2014 31  19 - 32 mEq/L Final  . Glucose, Bld 04/01/2014 89  70 - 99 mg/dL Final  . BUN 04/01/2014 16  6 - 23 mg/dL Final  . Creatinine, Ser 04/01/2014 0.72  0.40 - 1.20 mg/dL Final  . Total Bilirubin 04/01/2014 0.4  0.2 - 1.2 mg/dL Final  . Alkaline Phosphatase 04/01/2014 57  39 - 117 U/L Final  . AST 04/01/2014 17  0 - 37 U/L Final  . ALT 04/01/2014 13  0 - 35 U/L Final  . Total Protein 04/01/2014 7.5  6.0 - 8.3 g/dL Final  . Albumin  04/01/2014 4.4  3.5 - 5.2 g/dL Final  . Calcium 04/01/2014 10.0  8.4 - 10.5 mg/dL Final  . GFR 04/01/2014 103.35  >60.00 mL/min Final  . Microalb, Ur 04/01/2014 <0.7  0.0 - 1.9 mg/dL Final  . Creatinine,U 04/01/2014 85.7   Final  . Microalb Creat Ratio 04/01/2014 0.8  0.0 - 30.0 mg/g Final  . Color, Urine 04/01/2014 YELLOW  Yellow;Lt. Yellow Final  . APPearance 04/01/2014 CLEAR  Clear Final  . Specific Gravity, Urine 04/01/2014 1.015  1.000-1.030 Final  . pH 04/01/2014 7.5  5.0 - 8.0 Final  . Total Protein, Urine 04/01/2014 NEGATIVE  Negative Final  . Urine Glucose 04/01/2014 NEGATIVE  Negative Final  . Ketones, ur 04/01/2014 NEGATIVE  Negative Final  . Bilirubin Urine 04/01/2014 NEGATIVE  Negative Final  . Hgb urine dipstick 04/01/2014 NEGATIVE  Negative Final  . Urobilinogen, UA 04/01/2014 0.2  0.0 - 1.0 Final  . Leukocytes, UA 04/01/2014 NEGATIVE  Negative Final  . Nitrite 04/01/2014  NEGATIVE  Negative Final  . WBC, UA 04/01/2014 none seen  0-2/hpf Final  . RBC / HPF 04/01/2014 none seen  0-2/hpf Final  . Squamous Epithelial / LPF 04/01/2014 Few(5-10/hpf)* Rare(0-4/hpf) Final  . Cholesterol 04/01/2014 229* 0 - 200 mg/dL Final   ATP III Classification       Desirable:  < 200 mg/dL               Borderline High:  200 - 239 mg/dL          High:  > = 240 mg/dL  . Triglycerides 04/01/2014 89.0  0.0 - 149.0 mg/dL Final   Normal:  <150 mg/dLBorderline High:  150 - 199 mg/dL  . HDL 04/01/2014 69.50  >39.00 mg/dL Final  . VLDL 04/01/2014 17.8  0.0 - 40.0 mg/dL Final  . LDL Cholesterol 04/01/2014 142* 0 - 99 mg/dL Final  . Total CHOL/HDL Ratio 04/01/2014 3   Final                  Men          Women1/2 Average Risk     3.4          3.3Average Risk          5.0          4.42X Average Risk          9.6          7.13X Average Risk          15.0          11.0                      . NonHDL 04/01/2014 159.50   Final   NOTE:  Non-HDL goal should be 30 mg/dL higher than patient's LDL goal (i.e. LDL goal of < 70 mg/dL, would have non-HDL goal of < 100 mg/dL)      Medication List       This list is accurate as of: 04/06/14 10:24 AM.  Always use your most recent med list.               aspirin EC 325 MG tablet  Take 1 tablet (325 mg total) by mouth 2 (two) times daily after a meal.     Dulaglutide 0.75 MG/0.5ML Sopn  Commonly known as:  TRULICITY  Inject 9.37 mg into the skin once a week.     estrogens (conjugated) 0.625 MG tablet  Commonly  known as:  PREMARIN  Take 0.625 mg by mouth daily. Take daily for 21 days then do not take for 7 days.     fexofenadine 180 MG tablet  Commonly known as:  ALLEGRA  Take 180 mg by mouth daily as needed for allergies or rhinitis.     glucose blood test strip  Commonly known as:  ONE TOUCH ULTRA TEST  Use to test blood sugar 1 time daily as instructed. Dx code: E11.65     Insulin Pen Needle 32G X 4 MM Misc  Commonly known as:  BD PEN  NEEDLE NANO U/F  Use as directed     multivitamin with minerals tablet  Take 1 tablet by mouth daily.     ONETOUCH DELICA LANCETS 95M Misc  Use to test blood sugar 1 time daily as instructed. Dx code: E11.65     rosuvastatin 10 MG tablet  Commonly known as:  CRESTOR  Take 10 mg by mouth daily.     Vitamin D (Ergocalciferol) 50000 UNITS Caps capsule  Commonly known as:  DRISDOL  Take 50,000 Units by mouth every 7 (seven) days.        Allergies:  Allergies  Allergen Reactions  . Meperidine Hcl     REACTION: rash  . Statins Other (See Comments)    Muscle pain. Pt able to take Crestor but not everyday.    Past Medical History  Diagnosis Date  . Diabetes mellitus without complication   . GERD (gastroesophageal reflux disease)     Pt takes OTC when needed.  . Arthritis   . Carpal tunnel syndrome     Past Surgical History  Procedure Laterality Date  . Shoulder surgery Right   . Appendectomy    . Abdominal hysterectomy    . Tonsillectomy    . Eye surgery Bilateral     Cataracts removed  . Colonoscopy    . Esophagogastroduodenoscopy    . 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  Social History:  reports that she has never smoked. She has never used smokeless tobacco. She reports that she does not drink alcohol or use illicit drugs.  Review of Systems:  No history of hypertension. Renal function is normal   Lipids:  She in on Crestor from PCP. She thinks she has had joint pains from Crestor (and also Lipitor).  She is only able to take this about 2 times a week, previously a little more frequently. LDL has gone up significantly since her last visit She is due to follow-up with her PCP in about 6 weeks Apparently baseline LDL had been over 200     Lab Results  Component Value Date   CHOL 229* 04/01/2014   HDL 69.50 04/01/2014   LDLCALC 142* 04/01/2014   LDLDIRECT 120.1 09/30/2013   TRIG 89.0 04/01/2014   CHOLHDL 3 04/01/2014    Foot exam done in 11/15, this shows normal monofilament sensation in the toes and plantar surfaces, no skin lesions or ulcers on the feet and normal pedal pulses    Examination:  No edema   BP 118/80 mmHg  Pulse 63  Temp(Src) 98.5 F (36.9 C) (Oral)  Ht 5\' 2"  (1.575 m)  Wt 178 lb 9.6 oz (81.012 kg)  BMI 32.66 kg/m2  SpO2 95%  Body mass index is 32.66 kg/(m^2).    ASSESSMENT/ PLAN:  Diabetes type 2   Blood glucose control is overall excellent with upper normal A1c in good readings at home She has done well with trying to exercise on her treadmill Also she thinks she is watching her portions and overall doing well with her diet  For now we will continue her on Trulicity 7.97 alone Follow-up in 5-6 months  Hypercholesterolemia:  She will need to discuss alternatives to Crestor as her LDL has gone up.  Discussed possibility of using Livalo and she will check the insurance coverage for this also  [facility] 04/06/2014, 10:24 AM

## 2014-04-15 ENCOUNTER — Other Ambulatory Visit: Payer: Self-pay | Admitting: Endocrinology

## 2014-09-29 ENCOUNTER — Other Ambulatory Visit: Payer: Self-pay | Admitting: Endocrinology

## 2014-11-02 ENCOUNTER — Other Ambulatory Visit (INDEPENDENT_AMBULATORY_CARE_PROVIDER_SITE_OTHER): Payer: Medicare Other

## 2014-11-02 DIAGNOSIS — E119 Type 2 diabetes mellitus without complications: Secondary | ICD-10-CM | POA: Diagnosis not present

## 2014-11-02 LAB — URINALYSIS, ROUTINE W REFLEX MICROSCOPIC
Bilirubin Urine: NEGATIVE
HGB URINE DIPSTICK: NEGATIVE
Ketones, ur: NEGATIVE
Leukocytes, UA: NEGATIVE
NITRITE: NEGATIVE
PH: 5.5 (ref 5.0–8.0)
Specific Gravity, Urine: >=1.03 — AB (ref 1.000–1.030)
TOTAL PROTEIN, URINE-UPE24: NEGATIVE
URINE GLUCOSE: NEGATIVE
UROBILINOGEN UA: 0.2 (ref 0.0–1.0)

## 2014-11-02 LAB — COMPREHENSIVE METABOLIC PANEL
ALK PHOS: 47 U/L (ref 39–117)
ALT: 13 U/L (ref 0–35)
AST: 18 U/L (ref 0–37)
Albumin: 4.1 g/dL (ref 3.5–5.2)
BUN: 19 mg/dL (ref 6–23)
CO2: 28 mEq/L (ref 19–32)
Calcium: 9.9 mg/dL (ref 8.4–10.5)
Chloride: 105 mEq/L (ref 96–112)
Creatinine, Ser: 0.7 mg/dL (ref 0.40–1.20)
GFR: 106.58 mL/min (ref >60.00)
Glucose, Bld: 114 mg/dL — ABNORMAL HIGH (ref 70–99)
POTASSIUM: 4.3 meq/L (ref 3.5–5.1)
SODIUM: 140 meq/L (ref 135–145)
Total Bilirubin: 0.4 mg/dL (ref 0.2–1.2)
Total Protein: 7.1 g/dL (ref 6.0–8.3)

## 2014-11-02 LAB — HEMOGLOBIN A1C: HEMOGLOBIN A1C: 6.5 % (ref 4.6–6.5)

## 2014-11-05 ENCOUNTER — Ambulatory Visit (INDEPENDENT_AMBULATORY_CARE_PROVIDER_SITE_OTHER): Payer: Medicare Other | Admitting: Endocrinology

## 2014-11-05 VITALS — BP 130/78 | HR 82 | Temp 98.4°F | Resp 14 | Ht 62.0 in | Wt 183.0 lb

## 2014-11-05 DIAGNOSIS — Z23 Encounter for immunization: Secondary | ICD-10-CM

## 2014-11-05 NOTE — Patient Instructions (Signed)
Check blood sugars on waking up .Marland Kitchen 2-3 .Marland Kitchen times a week Also check blood sugars about 2 hours after a meal and do this after different meals by rotation  Recommended blood sugar levels on waking up is 90-110 and about 2 hours after meal is 120-150 Please bring blood sugar monitor to each visit.

## 2014-11-05 NOTE — Progress Notes (Signed)
Amanda [name] is an 69 y.o. female.             Reason for Appointment: Diabetes follow-up   History of Present Illness   Diagnosis: Type 2 DIABETES MELITUS     Previous history: She has had difficulty with blood sugar control initially because of limited tolerability with metformin and difficulty with weight loss. She did very well with starting Victoza which has helped her blood sugar control as well as some improvement in her obesity Her insurance company did not prefer Victoza and she was switched to Trulicity Subsequently her glipizide was stopped  Recent history:    Her blood sugars are again very well controlled with A1c upper normal Has been taking Trulicity 1.76 with no side effects except very mild transient nausea eating less. Unable to download her monitor but she thinks her blood sugars are consistently normal A1c is stable, now upper normal at 6.5 Not clear why she has gained a little weight even though she thinks she is exercising more     Oral hypoglycemic drugs:   none      Side effects from medications: Diarrhea from metformin.  Nausea from 1.5 mg Trulicity         Monitors blood glucose: ? Once a day.    Glucometer: One Touch ultra 1             Blood Glucose readings from recall about 90-105, unable to download meter which is out of battery    Hypoglycemia frequency:  none                Physical activity: exercise: walking on treadmill 4/7 upto 60 min            Wt Readings from Last 3 Encounters:  11/05/14 183 lb (83.008 kg)  04/06/14 178 lb 9.6 oz (81.012 kg)  01/05/14 180 lb (81.647 kg)    LABS:  Lab Results  Component Value Date   HGBA1C 6.5 11/02/2014   HGBA1C 6.6* 04/01/2014   HGBA1C 7.1* 12/31/2013   Lab Results  Component Value Date   MICROALBUR <0.7 04/01/2014   [place] 142* 04/01/2014   CREATININE 0.70 11/02/2014     Appointment on 11/02/2014  Component Date Value Ref Range Status  . Hgb A1c MFr Bld 11/02/2014 6.5  4.6 - 6.5 %  Final   Glycemic Control Guidelines for People with Diabetes:Non Diabetic:  <6%Goal of Therapy: <7%Additional Action Suggested:  >8%   . Sodium 11/02/2014 140  135 - 145 mEq/L Final  . Potassium 11/02/2014 4.3  3.5 - 5.1 mEq/L Final  . Chloride 11/02/2014 105  96 - 112 mEq/L Final  . CO2 11/02/2014 28  19 - 32 mEq/L Final  . Glucose, Bld 11/02/2014 114* 70 - 99 mg/dL Final  . BUN 11/02/2014 19  6 - 23 mg/dL Final  . Creatinine, Ser 11/02/2014 0.70  0.40 - 1.20 mg/dL Final  . Total Bilirubin 11/02/2014 0.4  0.2 - 1.2 mg/dL Final  . Alkaline Phosphatase 11/02/2014 47  39 - 117 U/L Final  . AST 11/02/2014 18  0 - 37 U/L Final  . ALT 11/02/2014 13  0 - 35 U/L Final  . Total Protein 11/02/2014 7.1  6.0 - 8.3 g/dL Final  . Albumin 11/02/2014 4.1  3.5 - 5.2 g/dL Final  . Calcium 11/02/2014 9.9  8.4 - 10.5 mg/dL Final  . GFR 11/02/2014 106.58  >60.00 mL/min Final  . Color, Urine 11/02/2014 YELLOW  Yellow;Lt. Yellow Final  .  APPearance 11/02/2014 CLEAR  Clear Final  . Specific Gravity, Urine 11/02/2014 >=1.030* 1.000 - 1.030 Final  . pH 11/02/2014 5.5  5.0 - 8.0 Final  . Total Protein, Urine 11/02/2014 NEGATIVE  Negative Final  . Urine Glucose 11/02/2014 NEGATIVE  Negative Final  . Ketones, ur 11/02/2014 NEGATIVE  Negative Final  . Bilirubin Urine 11/02/2014 NEGATIVE  Negative Final  . Hgb urine dipstick 11/02/2014 NEGATIVE  Negative Final  . Urobilinogen, UA 11/02/2014 0.2  0.0 - 1.0 Final  . Leukocytes, UA 11/02/2014 NEGATIVE  Negative Final  . Nitrite 11/02/2014 NEGATIVE  Negative Final  . WBC, UA 11/02/2014 0-2/hpf  0-2/hpf Final  . RBC / HPF 11/02/2014 0-2/hpf  0-2/hpf Final  . Mucus, UA 11/02/2014 Presence of* None Final  . Squamous Epithelial / LPF 11/02/2014 Rare(0-4/hpf)  Rare(0-4/hpf) Final      Medication List       This list is accurate as of: 11/05/14 10:09 AM.  Always use your most recent med list.               aspirin EC 325 MG tablet  Take 1 tablet (325 mg total)  by mouth 2 (two) times daily after a meal.     estrogens (conjugated) 0.625 MG tablet  Commonly known as:  PREMARIN  Take 0.625 mg by mouth daily. Take daily for 21 days then do not take for 7 days.     fexofenadine 180 MG tablet  Commonly known as:  ALLEGRA  Take 180 mg by mouth daily as needed for allergies or rhinitis.     glucose blood test strip  Commonly known as:  ONE TOUCH ULTRA TEST  Use to test blood sugar 1 time daily as instructed. Dx code: E11.65     Insulin Pen Needle 32G X 4 MM Misc  Commonly known as:  BD PEN NEEDLE NANO U/F  Use as directed     multivitamin with minerals tablet  Take 1 tablet by mouth daily.     ONETOUCH DELICA LANCETS 38B Misc  Use to test blood sugar 1 time daily as instructed. Dx code: E11.65     rosuvastatin 10 MG tablet  Commonly known as:  CRESTOR  Take 10 mg by mouth daily.     traMADol 50 MG tablet  Commonly known as:  ULTRAM  take 1 to 2 tablets by mouth every 6 hours if needed for pain     TRULICITY 0.17 PZ/0.2HE Sopn  Generic drug:  Dulaglutide  INJECT 0.75 MG UNDER THE SKIN ONCE A WEEK     Vitamin D (Ergocalciferol) 50000 UNITS Caps capsule  Commonly known as:  DRISDOL  Take 50,000 Units by mouth every 7 (seven) days.        Allergies:  Allergies  Allergen Reactions  . Meperidine Hcl     REACTION: rash  . Statins Other (See Comments)    Muscle pain. Pt able to take Crestor but not everyday.    Past Medical History  Diagnosis Date  . Diabetes mellitus without complication   . GERD (gastroesophageal reflux disease)     Pt takes OTC when needed.  . Arthritis   . Carpal tunnel syndrome     Past Surgical History  Procedure Laterality Date  . Shoulder surgery Right   . Appendectomy    . Abdominal hysterectomy    . Tonsillectomy    . Eye surgery Bilateral     Cataracts removed  . Colonoscopy    . Esophagogastroduodenoscopy    .  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 Total knee  arthroplasty Right 07/18/2013    Procedure: TOTAL KNEE ARTHROPLASTY;  Surgeon: Alta Corning, MD;  Location: [facility];  Service: Orthopedics;  Laterality: Right;    No family history on file.  Social History:  reports that she has never smoked. She has never used smokeless tobacco. She reports that she does not drink alcohol or use illicit drugs.  Review of Systems:  No history of hypertension. Renal function is normal   Lipids:  She in on Crestor from PCP. She thinks she has had joint pains from Crestor (and also Lipitor).  She is only able to take this about 2-4 times a week, depending on how much muscle pain she experiences  LDL has been above target, also followed by PCP and was about the same in April  Apparently baseline LDL had been over 200    Lab Results  Component Value Date   CHOL 229* 04/01/2014   HDL 69.50 04/01/2014   LDLCALC 142* 04/01/2014   LDLDIRECT 120.1 09/30/2013   TRIG 89.0 04/01/2014   CHOLHDL 3 04/01/2014    Foot exam done in 11/15, this shows normal monofilament sensation in the toes and plantar surfaces, no skin lesions or ulcers on the feet and normal pedal pulses    Examination:  No edema   BP 130/78 mmHg  Pulse 82  Temp(Src) 98.4 F (36.9 C)  Resp 14  Ht 5\' 2"  (1.575 m)  Wt 183 lb (83.008 kg)  BMI 33.46 kg/m2  SpO2 98%  Body mass index is 33.46 kg/(m^2).    ASSESSMENT/ PLAN:  Diabetes type 2   Blood glucose control is overall excellent with upper normal A1c and reportedly good readings at home She has done well with trying to exercise on her treadmill Not clear if her weight gain is reflecting increase muscle mass Also she thinks she is watching her portions and overall doing well with her diet with taking Trulicity which she is tolerating at the lower dose  For now we will continue her on Trulicity 3.70 alone Follow-up in 6 months  Hypercholesterolemia:  She will follow-up with PCP  Influenza vaccine given  [facility] 11/05/2014,  10:09 AM

## 2015-03-24 ENCOUNTER — Telehealth: Payer: Self-pay | Admitting: *Deleted

## 2015-03-24 ENCOUNTER — Encounter: Payer: Self-pay | Admitting: *Deleted

## 2015-03-24 DIAGNOSIS — Z170 Estrogen receptor positive status [ER+]: Secondary | ICD-10-CM

## 2015-03-24 DIAGNOSIS — C50411 Malignant neoplasm of upper-outer quadrant of right female breast: Secondary | ICD-10-CM | POA: Insufficient documentation

## 2015-03-24 HISTORY — DX: Malignant neoplasm of upper-outer quadrant of right female breast: C50.411

## 2015-03-24 NOTE — Telephone Encounter (Signed)
Confirmed [facility] for 03/31/15 at 0815 .  Instructions and contact information given.

## 2015-03-31 ENCOUNTER — Encounter: Payer: Self-pay | Admitting: Oncology

## 2015-03-31 ENCOUNTER — Encounter: Payer: Self-pay | Admitting: Skilled Nursing Facility1

## 2015-03-31 ENCOUNTER — Ambulatory Visit
Admission: RE | Admit: 2015-03-31 | Discharge: 2015-03-31 | Disposition: A | Discharge status: Home / Self Care | Payer: BLUE CROSS/BLUE SHIELD | Source: Ambulatory Visit | Attending: Radiation Oncology | Admitting: Radiation Oncology

## 2015-03-31 ENCOUNTER — Encounter: Payer: Self-pay | Admitting: Physical Therapy

## 2015-03-31 ENCOUNTER — Encounter: Payer: Self-pay | Admitting: General Practice

## 2015-03-31 ENCOUNTER — Ambulatory Visit (HOSPITAL_BASED_OUTPATIENT_CLINIC_OR_DEPARTMENT_OTHER): Payer: Medicare Other | Admitting: Oncology

## 2015-03-31 ENCOUNTER — Ambulatory Visit: Payer: Medicare Other | Attending: Surgery | Admitting: Physical Therapy

## 2015-03-31 ENCOUNTER — Other Ambulatory Visit (HOSPITAL_BASED_OUTPATIENT_CLINIC_OR_DEPARTMENT_OTHER): Payer: Medicare Other

## 2015-03-31 ENCOUNTER — Encounter: Payer: Self-pay | Admitting: Nurse Practitioner

## 2015-03-31 ENCOUNTER — Other Ambulatory Visit: Payer: Self-pay | Admitting: Surgery

## 2015-03-31 VITALS — BP 132/56 | HR 65 | Temp 97.5°F | Resp 18 | Ht 62.0 in | Wt 179.7 lb

## 2015-03-31 DIAGNOSIS — C50911 Malignant neoplasm of unspecified site of right female breast: Secondary | ICD-10-CM

## 2015-03-31 DIAGNOSIS — E119 Type 2 diabetes mellitus without complications: Secondary | ICD-10-CM

## 2015-03-31 DIAGNOSIS — C50411 Malignant neoplasm of upper-outer quadrant of right female breast: Secondary | ICD-10-CM

## 2015-03-31 DIAGNOSIS — R293 Abnormal posture: Secondary | ICD-10-CM | POA: Insufficient documentation

## 2015-03-31 DIAGNOSIS — Z170 Estrogen receptor positive status [ER+]: Secondary | ICD-10-CM

## 2015-03-31 DIAGNOSIS — N951 Menopausal and female climacteric states: Secondary | ICD-10-CM | POA: Diagnosis not present

## 2015-03-31 LAB — CBC WITH DIFFERENTIAL/PLATELET
BASO%: 1.1 % (ref 0.0–2.0)
BASOS ABS: 0.1 10*3/uL (ref 0.0–0.1)
EOS ABS: 0.1 10*3/uL (ref 0.0–0.5)
EOS%: 2.5 % (ref 0.0–7.0)
HCT: 41.8 % (ref 34.8–46.6)
HEMOGLOBIN: 13.9 g/dL (ref 11.6–15.9)
LYMPH%: 67.3 % — AB (ref 14.0–49.7)
MCH: 31.2 pg (ref 25.1–34.0)
MCHC: 33.3 g/dL (ref 31.5–36.0)
MCV: 93.9 fL (ref 79.5–101.0)
MONO#: 0.6 10*3/uL (ref 0.1–0.9)
MONO%: 9.9 % (ref 0.0–14.0)
NEUT#: 1.1 10*3/uL — ABNORMAL LOW (ref 1.5–6.5)
NEUT%: 19.2 % — ABNORMAL LOW (ref 38.4–76.8)
Platelets: 268 10*3/uL (ref 145–400)
RBC: 4.45 10*6/uL (ref 3.70–5.45)
RDW: 13.9 % (ref 11.2–14.5)
WBC: 5.6 10*3/uL (ref 3.9–10.3)
lymph#: 3.8 10*3/uL — ABNORMAL HIGH (ref 0.9–3.3)

## 2015-03-31 LAB — COMPREHENSIVE METABOLIC PANEL
ALBUMIN: 3.9 g/dL (ref 3.5–5.0)
ALK PHOS: 55 U/L (ref 40–150)
ALT: 17 U/L (ref 0–55)
AST: 20 U/L (ref 5–34)
Anion Gap: 11 mEq/L (ref 3–11)
BUN: 17.6 mg/dL (ref 7.0–26.0)
CO2: 27 mEq/L (ref 22–29)
Calcium: 10.2 mg/dL (ref 8.4–10.4)
Chloride: 105 mEq/L (ref 98–109)
Creatinine: 0.9 mg/dL (ref 0.6–1.1)
EGFR: 73 mL/min/{1.73_m2} — AB (ref >90)
GLUCOSE: 149 mg/dL — AB (ref 70–140)
POTASSIUM: 4 meq/L (ref 3.5–5.1)
SODIUM: 143 meq/L (ref 136–145)
Total Bilirubin: 0.53 mg/dL (ref 0.20–1.20)
Total Protein: 7.8 g/dL (ref 6.4–8.3)

## 2015-03-31 LAB — TECHNOLOGIST REVIEW

## 2015-03-31 NOTE — Progress Notes (Signed)
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 female who presented with a screening mammogram. She was found to have nodularity in the upper outer quadrant of the right breast. On ultrasound, these were noted to be a 1.1 cm mass and a simple cyst. Biopsy of the mass on 03/22/15 showed grade 1 invasive ductal carcinoma with papillary features. This was ER positive, PR negative, HER2 negative, and Ki67 of 15%. She has recovered well from her biopsy.  The patient has been referred to breast multidisciplinary clinic for the management of her disease. She is unaccompanied today.  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   PREVIOUS RADIATION THERAPY: No  Past medical, social and family history were reviewed in the electronic chart. Review of symptoms was reviewed in the electronic chart. Medications were reviewed in the electronic chart.   Gynecologic History  Age at first menstrual period? 12  Are you still having periods? No  If you no longer have periods: Have you used hormone replacement? Yes  If YES, for how long? 20 years. Premarin. Obstetric History:  How many children have you carried to term? 2 Your age at first live birth? 39  Pregnant now or trying to get pregnant? No  Have you used birth control pills or hormone shots for contraception? No Health Maintenance:  Have you ever had a colonoscopy? Yes If yes, date? 2016  Have you ever had a bone density? Yes If yes, date? 2016  Date of your last PAP smear? At least 10 years ago. Date of your FIRST mammogram? Last week.  She complains of night sweats, sinus problems, sleeps on more than one pillow (two),  arthritis, diabetes, and hot flashes.  PHYSICAL EXAM:  Vitals with BMI 03/31/2015  Height '5\' 2"'   Weight 179 lbs 11 oz  BMI 76.2  Systolic 263  Diastolic 56  Pulse 65  Respirations 18   She has large pendulous breasts bilaterally. No palpable cervical, supraclavicular, or axillary adenopathy. No palpable abnormalities of the left breast. Alert and oriented x3. Bruising over the upper outer quadrant of the right breast.  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  PLAN: The consensus for her treatment entails a right lumpectomy and sentinel lymph node biopsy, oncotype testing, radiation therapy, followed by anti-estrogen medication.  I spoke to the patient today regarding her diagnosis and options for treatment. We discussed the equivalence in terms of survival and local failure between mastectomy and breast conservation. We discussed the role of radiation in decreasing local failures in patients who undergo lumpectomy. We discussed the process of simulation and the placement tattoos. We discussed 4-6 weeks of treatment as an outpatient. We discussed the possibility of asymptomatic lung damage. We discussed the low likelihood of secondary malignancies. We discussed the possible side effects including but not limited to skin redness, fatigue, permanent skin darkening, and breast swelling.  I advised the patient that she is to stop taking Premarin.  I spent 40 minutes  face to face with the patient and more than 50% of that time was spent in counseling and/or coordination of care.   ------------------------------------------------  Thea Silversmith, MD  This  document serves as a record of services personally performed by Thea Silversmith, MD. It was created on her behalf by Darcus Austin, a trained medical scribe. The creation of this record is based on the scribe's personal observations and the provider's statements to them. This document has been checked and  approved by the attending provider.

## 2015-03-31 NOTE — Progress Notes (Signed)
[facility]  Telephone:([phone] Fax:(336) [phone]     ID: Amanda [name] DOB: [birth year]  MR#: [id number]  TJQ#:[id number]  Patient Care Team: Antony Contras, MD as PCP - General (Family Medicine) Sylvan Cheese, NP as Nurse Practitioner (Hematology and Oncology) Chauncey Cruel, MD as Consulting Physician (Oncology) Thea Silversmith, MD as Consulting Physician (Radiation Oncology) Alphonsa Overall, MD as Consulting Physician (General Surgery) [name], MD as Consulting Physician (Endocrinology) PCP: Gara Kroner, MD GYN: OTHER MD:  CHIEF COMPLAINT: estrogen receptor positive breast cancer  CURRENT TREATMENT: awaiting definitive surgery   BREAST CANCER HISTORY: Amanda [name] had routine screening mammography at [facility] 03/10/2015. The breast density was category B. There was a new cluster of nodules in the right breast at the 11:00 position measuring approximately 5 mm. Ultrasonography 03/15/2014 confirmed a 1.1 center meter oval mass in the right breast at the 10:00 position 6 cm from the nipple. There was a benign 0.8 cm simple cyst immediately adjacent to the mass. The right axilla was sonographically benign.  Biopsy of the right breast mass in question 03/22/2015 showed ([id number]) invasive ductal carcinoma, grade 1, estrogen receptor 95% positive, progesterone receptor 80% positive, both with strong staining intensity, with an MIB-1 of 15%, and no HER-2 amplification, the signals ratio being 1.58 and the number per cell 3.00.  Her subsequent history is as detailed below  INTERVAL HISTORY: Amanda [name] was evaluated in the multidisciplinary breast cancer clinic 03/31/2015. Her case was also presented in the multidisciplinary breast cancer conference that same morning. At that time a preliminary plan was proposed: Breast conserving surgery with sentinel lymph node sampling, Oncotype DX, adjuvant radiation, and adjuvant hormones  REVIEW OF SYSTEMS: There were no specific  symptoms leading to the original mammogram, which was routinely scheduled. The patient denies unusual headaches, visual changes, nausea, vomiting, stiff neck, dizziness, or gait imbalance. There has been no cough, phlegm production, or pleurisy, no chest pain or pressure, and no change in bowel or bladder habits. The patient denies fever, rash, bleeding, unexplained fatigue or unexplained weight loss. She exercises almost daily at least 1 hour on the treadmill. She tells me her diabetes is well-controlled. She does have some arthritis pains here and there which are not more intense or persistent than before. She has seasonal allergies. She has night sweats and hot flashes. A detailed review of systems was otherwise entirely negative.  PAST MEDICAL HISTORY: Past Medical History  Diagnosis Date  . Diabetes mellitus without complication ([place])   . GERD (gastroesophageal reflux disease)     Pt takes OTC when needed.  . Arthritis   . Carpal tunnel syndrome   . Breast cancer of upper-outer quadrant of right female breast ([place]) 03/24/2015  . Hot flashes     PAST SURGICAL HISTORY: Past Surgical History  Procedure Laterality Date  . Shoulder surgery Right   . Appendectomy    . Abdominal hysterectomy    . Tonsillectomy    . Eye surgery Bilateral     Cataracts removed  . Colonoscopy    . Esophagogastroduodenoscopy    . 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 Total knee arthroplasty Right 07/18/2013    Procedure: TOTAL KNEE ARTHROPLASTY;  Surgeon: Alta Corning, MD;  Location: [facility];  Service: Orthopedics;  Laterality: Right;    FAMILY HISTORY History reviewed. No pertinent family history. The patient's father died in his 58s from reasons unclear to the patient. Her mother died from heart disease at  the age of 12. The patient had one brother, 5 sisters. There is no history of breast or ovarian cancer in the family to her knowledge.  GYNECOLOGIC HISTORY:  No LMP recorded. Patient  has had a hysterectomy. Menarche age 29, first live birth age 69. She is GX P2. She underwent hysterectomy with bilateral salpingo-oophorectomy in her 56s. 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 She is divorced and lives alone, with no pets. Son [name] lives in [place] where he manages an auto zone. Daughter [name] lives in [place]. She works for [employer]. The patient has 4 grandchildren. She is a Psychologist, forensic.    ADVANCED DIRECTIVES: Not in place   HEALTH MAINTENANCE: Social History  Substance Use Topics  . Smoking status: Never Smoker   . Smokeless tobacco: Never Used  . Alcohol Use: No     Comment: socially, occ weekends     Colonoscopy: 2016/ [facility]  PAP: Status post hysterectomy  Bone density: 2016/ normal/ [facility]  Lipid panel:  Allergies  Allergen Reactions  . Meperidine Hcl     REACTION: rash  . Statins Other (See Comments)    Muscle pain. Pt able to take Crestor but not everyday.    Current Outpatient Prescriptions  Medication Sig Dispense Refill  . aspirin EC 325 MG tablet Take 1 tablet (325 mg total) by mouth 2 (two) times daily after a meal. 60 tablet 0  . estrogens, conjugated, (PREMARIN) 0.625 MG tablet Take 0.625 mg by mouth daily. Take daily for 21 days then do not take for 7 days.    . hydrochlorothiazide (HYDRODIURIL) 12.5 MG tablet Take 12.5 mg by mouth daily.    . rosuvastatin (CRESTOR) 10 MG tablet Take 10 mg by mouth daily.    . TRULICITY 7.93 JQ/3.0SP SOPN INJECT 0.75 MG UNDER THE SKIN ONCE A WEEK 6 mL 2  . Vitamin D, Ergocalciferol, (DRISDOL) 50000 UNITS CAPS capsule Take 50,000 Units by mouth every 7 (seven) days.     No current facility-administered medications for this visit.    OBJECTIVE: Middle-aged African-American woman in no acute distress Filed Vitals:   03/31/15 0940  BP: 132/56  Pulse: 65  Temp: 97.5 F (36.4 C)  Resp: 18      Body mass index is 32.86 kg/(m^2).    ECOG FS:1 - Symptomatic but completely ambulatory  Ocular: Sclerae unicteric, pupils equal, round and reactive to light Ear-nose-throat: Oropharynx clear and moist Lymphatic: No cervical or supraclavicular adenopathy Lungs no rales or rhonchi, good excursion bilaterally Heart regular rate and rhythm, no murmur appreciated Abd soft, nontender, positive bowel sounds MSK no focal spinal tenderness, no joint edema Neuro: non-focal, well-oriented, appropriate affect Breasts: The right breast is status post recent biopsy. I do not palpate a mass. There is no skin or nipple changes of concern. The right axilla is benign. The left breast is unremarkable.   LAB RESULTS:  CMP     Component Value Date/Time   NA 143 03/31/2015 0831   NA 140 11/02/2014 0826   K 4.0 03/31/2015 0831   K 4.3 11/02/2014 0826   CL 105 11/02/2014 0826   CO2 27 03/31/2015 0831   CO2 28 11/02/2014 0826   GLUCOSE 149* 03/31/2015 0831   GLUCOSE 114* 11/02/2014 0826   BUN 17.6 03/31/2015 0831   BUN 19 11/02/2014 0826   CREATININE 0.9 03/31/2015 0831   CREATININE 0.70 11/02/2014 0826  CALCIUM 10.2 03/31/2015 0831   CALCIUM 9.9 11/02/2014 0826   PROT 7.8 03/31/2015 0831   PROT 7.1 11/02/2014 0826   ALBUMIN 3.9 03/31/2015 0831   ALBUMIN 4.1 11/02/2014 0826   AST 20 03/31/2015 0831   AST 18 11/02/2014 0826   ALT 17 03/31/2015 0831   ALT 13 11/02/2014 0826   ALKPHOS 55 03/31/2015 0831   ALKPHOS 47 11/02/2014 0826   BILITOT 0.53 03/31/2015 0831   BILITOT 0.4 11/02/2014 0826   GFRNONAA 71* 07/19/2013 0405   GFRAA 82* 07/19/2013 0405    INo results found for: SPEP, UPEP  Lab Results  Component Value Date   WBC 5.6 03/31/2015   NEUTROABS 1.1* 03/31/2015   HGB 13.9 03/31/2015   HCT 41.8 03/31/2015   MCV 93.9 03/31/2015   PLT 268 03/31/2015      Chemistry      Component Value Date/Time   NA 143 03/31/2015 0831   NA 140 11/02/2014 0826   K 4.0 03/31/2015 0831   K  4.3 11/02/2014 0826   CL 105 11/02/2014 0826   CO2 27 03/31/2015 0831   CO2 28 11/02/2014 0826   BUN 17.6 03/31/2015 0831   BUN 19 11/02/2014 0826   CREATININE 0.9 03/31/2015 0831   CREATININE 0.70 11/02/2014 0826      Component Value Date/Time   CALCIUM 10.2 03/31/2015 0831   CALCIUM 9.9 11/02/2014 0826   ALKPHOS 55 03/31/2015 0831   ALKPHOS 47 11/02/2014 0826   AST 20 03/31/2015 0831   AST 18 11/02/2014 0826   ALT 17 03/31/2015 0831   ALT 13 11/02/2014 0826   BILITOT 0.53 03/31/2015 0831   BILITOT 0.4 11/02/2014 0826       No results found for: LABCA2  No components found for: [id number]  No results for input(s): INR in the last 168 hours.  Urinalysis    Component Value Date/Time   COLORURINE YELLOW 11/02/2014 0826   APPEARANCEUR CLEAR 11/02/2014 0826   LABSPEC >=1.030* 11/02/2014 0826   PHURINE 5.5 11/02/2014 0826   GLUCOSEU NEGATIVE 11/02/2014 0826   GLUCOSEU NEGATIVE 07/10/2013 1548   HGBUR NEGATIVE 11/02/2014 0826   BILIRUBINUR NEGATIVE 11/02/2014 0826   KETONESUR NEGATIVE 11/02/2014 0826   PROTEINUR NEGATIVE 07/10/2013 1548   UROBILINOGEN 0.2 11/02/2014 0826   NITRITE NEGATIVE 11/02/2014 0826   LEUKOCYTESUR NEGATIVE 11/02/2014 0826      ELIGIBLE FOR AVAILABLE RESEARCH PROTOCOL: no  STUDIES: No results found.  ASSESSMENT: 70 y.o. [place] woman status post right breast biopsy 03/22/2015 for a clinical T1CN0, stage IA invasive ductal carcinoma, grade 1, estrogen and progesterone receptor positive, HER-2 not amplified, with an MIB-1 of 15%.  (1) breast conserving surgery pending  (2) Oncotype DX to be requested from the definitive surgical sample  (3) adjuvant radiation to follow  (4) anti-estrogens to follow the completion of local treatment  PLAN: We spent the better part of today's hour-long appointment discussing the biology of breast cancer in general, and the specifics of the patient's tumor in particular. Amanda [name] understands the difference  between local and systemic therapies for breast cancer. She was concerned that this cancer has been there for years and yet it was found. We discussed the limit of detection and I reassured her that her rest cancer was found as early as it could be found and that as a result she has a very good chance of cure.  We discussed the difference between local and systemic therapy for breast cancer. She understands there is no  survival advantage to mastectomy as compared to lumpectomy and radiation and that lumpectomy and radiation is our recommendation.  We then discussed systemic therapies. She will be an excellent candidate for anti-estrogens. She is not a candidate for anti-HER-2 treatment.  The chemotherapy question is more complex. Chemotherapy generally is very helpful and rapidly growing aggressive tumors. She has a slow-growing nonaggressive looking tumor. Accordingly the presumption is she would get very marginal benefit from chemotherapy.  To quantitate this we are requesting an Oncotype from the definitive surgical sample. That test takes approximately 2 weeks. She will see me accordingly 2 or 3 weeks after her surgery to discuss those results. My expectation however is that she will benefit so minimally from chemotherapy that it can be foregone.  She understands her Premarin has not caused her breast cancer but certainly has been feeding. She has been strongly advised to discontinue that. She is likely to have some menopausal symptoms developing over the next few weeks and she will let us know if that happens so we can help ameliorate them.  She had a normal bone density last year. She would be a good candidate for anastrozole once we get to that point in her treatment plan.  The patient has a good understanding of the overall plan. She agrees with it. She knows the goal of treatment in her case is cure. She will call with any problems that may develop before her next visit  here.  Chauncey Cruel, MD   03/31/2015 1:18 PM Medical Oncology and Hematology [facility] [address] [place], [place] [address] Tel. [phone]    Fax. [phone]

## 2015-03-31 NOTE — Patient Instructions (Signed)

## 2015-03-31 NOTE — Progress Notes (Signed)
Subjective:     Patient ID: Amanda [name], female   DOB: February 27, 1945, 70 y.o.   MRN: [id number]  HPI   Review of Systems     Objective:   Physical Exam For the patient to understand and be given the tools to implement a healthy plant based diet during their cancer diagnosis.     Assessment:     Patient was seen today and found to be pleasant. Pts medication crestor, vitamin D. Pt has diabetes. Pt is 5'2'', 183 pounds, and BMI 33.5. Pt states she has diabetes. Pt states she loves food. Pt states she does not eat breakfast but sometimes she eats fruit, crackers, and peanut butter.     Plan:     Dietitian educated the patient on implementing a plant based diet by incorporating more plant proteins, fruits, and vegetables. As a part of a healthy routine physical activity was discussed. Dietitian educated the pt on meal timing and balance of meals for cancer/diaetes.  The importance of legitimate, evidence based information was discussed and examples were given. A folder of evidence based information with a focus on a plant based diet and general nutrition during cancer was given to the patient.  As a part of the continuum of care the cancer dietitian's contact information was given to the patient in the event they would like to have a follow up appointment.

## 2015-03-31 NOTE — Progress Notes (Signed)
Ms. [name] is a very pleasant 70 y.o. 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  Biopsy results revealed the tumor's prognostic profile is ER positive, PR positive, and HER2/neu negative. Ki67 is 15%.  She presents today to the [facility]) for treatment consideration and recommendations from the breast surgeon, radiation oncologist, and medical oncologist.     I briefly met with Ms. [place] [name] during her [facility] visit today. We discussed the purpose of the Survivorship Clinic, which will include monitoring for recurrence, coordinating completion of age and gender-appropriate cancer screenings, promotion of overall wellness, as well as managing potential late/long-term side effects of anti-cancer treatments.    The treatment plan for Ms. Amanda [name] will likely include surgery, radiation therapy, and anti-estrogen therapy.  As of today, the intent of treatment for Ms. Amanda [name] is cure, therefore she will be eligible for the Survivorship Clinic upon her completion of treatment.  Her survivorship care plan (SCP) document will be drafted and updated throughout the course of her treatment trajectory. She will receive the SCP in an office visit with myself in the Survivorship Clinic once she has completed treatment.   Ms. [name] was encouraged to ask questions and all questions were answered to her satisfaction.  She was given my business card and encouraged to contact me with any concerns regarding survivorship.  I look forward to participating in her care.   Kenn File, [place] [phone]

## 2015-03-31 NOTE — Therapy (Signed)
[place], [place], [address] Phone: [phone]   Fax:  [phone]  Physical Therapy Evaluation  Patient Details  Name: Amanda [name] MRN: [id number] Date of Birth: 1945/07/16 Referring Provider: Dr. Alphonsa Overall  Encounter Date: 03/31/2015      PT End of Session - 03/31/15 1211    Visit Number 1   Number of Visits 1   PT Start Time 2620   PT Stop Time 1117   PT Time Calculation (min) 23 min   Activity Tolerance Patient tolerated treatment well   Behavior During Therapy [facility] for tasks assessed/performed      Past Medical History  Diagnosis Date  . Diabetes mellitus without complication ([place])   . GERD (gastroesophageal reflux disease)     Pt takes OTC when needed.  . Arthritis   . Carpal tunnel syndrome   . Breast cancer of upper-outer quadrant of right female breast ([place]) 03/24/2015  . Hot flashes     Past Surgical History  Procedure Laterality Date  . Shoulder surgery Right   . Appendectomy    . Abdominal hysterectomy    . Tonsillectomy    . Eye surgery Bilateral     Cataracts removed  . Colonoscopy    . Esophagogastroduodenoscopy    . 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 Total knee arthroplasty Right 07/18/2013    Procedure: TOTAL KNEE ARTHROPLASTY;  Surgeon: Alta Corning, MD;  Location: [place];  Service: Orthopedics;  Laterality: Right;    There were no vitals filed for this visit.  Visit Diagnosis:  Abnormal posture - Plan: PT plan of care cert/re-cert  Carcinoma of upper-outer quadrant of right female breast [facility]) - Plan: PT plan of care cert/re-cert      Subjective Assessment - 03/31/15 1149    Subjective Patient was seen today for a baseline assessment of her newly diagnosed right breast cancer.  The breast multidisciplinary team met at conference to discuss her case and determine a recommended treatment plan.   Pertinent History Patient was diagnosed on  03/25/15 with right invasive ductal carcinoma which is ER/PR positive and HER2 negative with a Ki67 of 15%.  It measures 1.1 cm and is located in the right upper outer quadrant.   Patient Stated Goals Reduce lymphedema risk and learn post op shoulder ROM HEP.   Currently in Pain? No/denies            [facility] PT Assessment - 03/31/15 0001    Assessment   Medical Diagnosis Right breast cancer   Referring Provider Dr. Alphonsa Overall   Onset Date/Surgical Date 03/25/15   Hand Dominance Right   Prior Therapy none   Precautions   Precautions Other (comment)   Precaution Comments active breast cancer   Restrictions   Weight Bearing Restrictions No   Balance Screen   Has the patient fallen in the past 6 months No   Has the patient had a decrease in activity level because of a fear of falling?  No   Is the patient reluctant to leave their home because of a fear of falling?  No   Home Environment   Living Environment Private residence   Living Arrangements Alone  Has 5 sisters who live close by   Available Help at Discharge Family   Prior Function   Level of [place] Retired   Leisure She walks 4x/week for 1 hour   Cognition  Overall Cognitive Status Within Functional Limits for tasks assessed   Posture/Postural Control   Posture/Postural Control Postural limitations   Postural Limitations Rounded Shoulders;Forward head   ROM / Strength   AROM / PROM / Strength AROM;Strength   AROM   AROM Assessment Site Shoulder   Right/Left Shoulder Right;Left   Right Shoulder Extension 49 Degrees   Right Shoulder Flexion 149 Degrees   Right Shoulder ABduction 153 Degrees   Right Shoulder Internal Rotation 72 Degrees   Right Shoulder External Rotation 80 Degrees   Left Shoulder Extension 50 Degrees   Left Shoulder Flexion 152 Degrees   Left Shoulder ABduction 135 Degrees   Left Shoulder Internal Rotation 63 Degrees   Left Shoulder External Rotation 76 Degrees    Strength   Overall Strength Within functional limits for tasks performed           LYMPHEDEMA/ONCOLOGY QUESTIONNAIRE - 03/31/15 1209    Type   Cancer Type Right breast cancer   Lymphedema Assessments   Lymphedema Assessments Upper extremities   Right Upper Extremity Lymphedema   10 cm Proximal to Olecranon Process 30.8 cm   Olecranon Process 25.8 cm   10 cm Proximal to Ulnar Styloid Process 22.5 cm   Just Proximal to Ulnar Styloid Process 16.2 cm   Across Hand at [employer] 18.8 cm   At [place] of 2nd Digit 6.7 cm   Left Upper Extremity Lymphedema   10 cm Proximal to Olecranon Process 30.9 cm   Olecranon Process 26 cm   10 cm Proximal to Ulnar Styloid Process 22.8 cm   Just Proximal to Ulnar Styloid Process 16.2 cm   Across Hand at [employer] 18.7 cm   At [place] of 2nd Digit 6.3 cm      Patient was instructed today in a home exercise program today for post op shoulder range of motion. These included active assist shoulder flexion in sitting, scapular retraction, wall walking with shoulder abduction, and hands behind head external rotation.  She was encouraged to do these twice a day, holding 3 seconds and repeating 5 times when permitted by her physician.         PT Education - 03/31/15 1211    Education provided Yes   Education Details Lymphedema risk reduction and post op shoulder ROM HEP   Person(s) Educated Patient   Methods Explanation;Demonstration;Handout   Comprehension Returned demonstration;Verbalized understanding              Breast Clinic Goals - 03/31/15 1214    Patient will be able to verbalize understanding of pertinent lymphedema risk reduction practices relevant to her diagnosis specifically related to skin care.   Time 1   Period Days   Status Achieved   Patient will be able to return demonstrate and/or verbalize understanding of the post-op home exercise program related to regaining shoulder range of motion.   Time 1   Period Days    Status Achieved   Patient will be able to verbalize understanding of the importance of attending the postoperative After Breast Cancer Class for further lymphedema risk reduction education and therapeutic exercise.   Time 1   Period Days   Status Achieved              Plan - 03/31/15 1212    Clinical Impression Statement Patient was diagnosed on 03/25/15 with right invasive ductal carcinoma which is ER/PR positive and HER2 negative with a Ki67 of 15%.  It measures 1.1 cm and is  located in the right upper outer quadrant.  She is planning to have a right lumpectomy and sentinel node biopsy followed by radiation and anti-estrogen therapy.  She may benefit from post op PT to regain shoulder ROM and strength and reduce lymphedema risk.   Pt will benefit from skilled therapeutic intervention in order to improve on the following deficits Decreased strength;Pain;Decreased knowledge of precautions;Impaired UE functional use;Decreased range of motion   Rehab Potential Excellent   Clinical Impairments Affecting Rehab Potential none   PT Frequency One time visit   PT Treatment/Interventions Therapeutic exercise;Patient/family education   PT Next Visit Plan F/u after surgery to determine needs   PT Home Exercise Plan shoulder ROM HEP for post op   Consulted and Agree with Plan of Care Patient          G-Codes - 04-27-2015 1214    Functional Assessment Tool Used Clinical Judgement   Functional Limitation Other PT primary   Other PT Primary Current Status (P5945) At least 1 percent but less than 20 percent impaired, limited or restricted   Other PT Primary Goal Status (O5929) At least 1 percent but less than 20 percent impaired, limited or restricted   Other PT Primary Discharge Status (W4462) At least 1 percent but less than 20 percent impaired, limited or restricted     Patient will follow up at outpatient cancer rehab if needed following surgery.  If the patient requires physical therapy at  that time, a specific plan will be dictated and sent to the referring physician for approval. The patient was educated today on appropriate basic range of motion exercises to begin post operatively and the importance of attending the After Breast Cancer class following surgery.  Patient was educated today on lymphedema risk reduction practices as it pertains to recommendations that will benefit the patient immediately following surgery.  She verbalized good understanding.  No additional physical therapy is indicated at this time.     Problem List Patient Active Problem List   Diagnosis Date Noted  . Breast cancer of upper-outer quadrant of right female breast ([place]) 03/24/2015  . Acute blood loss anemia 07/20/2013  . Osteoarthritis of right knee 07/18/2013  . Osteoarthritis of left knee 07/18/2013  . Hyperlipidemia 11/26/2012  . Osteoarthritis of both knees 11/26/2012  . Type II or unspecified type diabetes mellitus without mention of complication, uncontrolled 03/11/2007  . COUGH 03/11/2007   Annia Friendly, PT Apr 27, 2015 12:16 PM  [place], [place], [address] Phone: [phone]   Fax:  [phone]  Name: Amanda [name] MRN: [id number] Date of Birth: [birth year]

## 2015-04-02 ENCOUNTER — Telehealth: Payer: Self-pay | Admitting: *Deleted

## 2015-04-02 ENCOUNTER — Telehealth: Payer: Self-pay | Admitting: Oncology

## 2015-04-02 NOTE — Telephone Encounter (Signed)
Spoke with patien tand confirmed appt date/time for 4/7 at 130 pm per MD 2/22 pof

## 2015-04-02 NOTE — Telephone Encounter (Signed)
Spoke with patient from [facility] 03/31/15.  She is doing well.  Encouraged her to call with any needs or concerns.  

## 2015-04-05 ENCOUNTER — Telehealth: Payer: Self-pay | Admitting: *Deleted

## 2015-04-05 NOTE — Progress Notes (Signed)
[name] Psychosocial Distress Screening Spiritual Care  Spiritual Care was referred by distress screening protocol.  The patient scored a 3 on the Psychosocial Distress Thermometer which indicates mild distress. Reached Amanda [name] by phone to assess for distress and other psychosocial needs.   ON[facility]N DISTRESS SCREENING 04/05/2015  Screening Type Initial Screening  Distress experienced in past week (1-10) 3  Emotional problem type Adjusting to illness  Information Concerns Type Lack of info about treatment  Physical Problem type Sleep/insomnia;Tingling hands/feet  Referral to support programs Yes  Other [place], [place] team   Amanda [name] welcomed Spiritual Care call to explore needs and provide emotional support.  She used opportunity to process both her anxieties and her gratitude.  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  She understands her less severe situation as a blessing.  Normalized her variety of feelings, from gratitude to anxiety, affirming the "both/and" of using perspective to cope while acknowledging her personal distress.   She also verbalized some confusion about details of her surgery schedule. Consulted with nurse navigator Jessee Avers, who plans to phone pt with clarification.  Provided information about Support Center team/resources.  Will mail pt packet of programming and support group info, including my card, per pt request.  Follow up needed: No.  Amanda [name] plans to phone to schedule f/u support appt when desired.  She also reports that [place] has already contacted her.  Please page as needs arise/circumstances change.  Thank you.  [place], [place], [facility] Pager [phone] Voicemail  [phone]

## 2015-04-05 NOTE — Telephone Encounter (Signed)
  Oncology Nurse Navigator Documentation    Navigator Encounter Type: Telephone (Discussed surgical f/u) (04/05/15 1300) Telephone: [place] Call (04/05/15 1300)     Surgery Date: 04/16/15 (04/05/15 1300) Treatment Initiated Date: 03/19/15 (04/05/15 1300)                                Time Spent with Patient: 30 (04/05/15 1300)

## 2015-04-08 ENCOUNTER — Other Ambulatory Visit: Payer: Self-pay | Admitting: Endocrinology

## 2015-04-12 ENCOUNTER — Encounter (HOSPITAL_BASED_OUTPATIENT_CLINIC_OR_DEPARTMENT_OTHER): Payer: Self-pay | Admitting: *Deleted

## 2015-04-12 ENCOUNTER — Encounter (HOSPITAL_BASED_OUTPATIENT_CLINIC_OR_DEPARTMENT_OTHER)
Admission: RE | Admit: 2015-04-12 | Discharge: 2015-04-12 | Disposition: A | Discharge status: Home / Self Care | Payer: Medicare Other | Source: Ambulatory Visit | Attending: Surgery | Admitting: Surgery

## 2015-04-12 DIAGNOSIS — E119 Type 2 diabetes mellitus without complications: Secondary | ICD-10-CM | POA: Insufficient documentation

## 2015-04-12 DIAGNOSIS — Z01810 Encounter for preprocedural cardiovascular examination: Secondary | ICD-10-CM | POA: Insufficient documentation

## 2015-04-15 NOTE — H&P (Signed)
Amanda [name] [facility]  Location: [facility] Patient #: [id number] DOB: 12-15-1945 Undefined / Language: Cleophus [name] / Race: Black or African American Female  History of Present Illness   The patient is a 70 year old female who presents with breast cancer.   Her PCP is Dr. Iline Oven  She is at the Breast [place]. Her oncologist are Drs. Magrinat and Pablo Ledger.  She comes by herself.   She had a mammogram about one year ago. She has had no prior history of breast biopsies or breast disease. He does have hot flashes and is on Premarin. I recommend her stopping the Premarin and explained why. She has no family history of breast cancer.   She had a mammogram on 03/10/2015 at [facility] which showed an abnormality at the 11:00 position and further studies were suggested. She had a mammogram/US on 03/16/2015 at [facility] which showed a 1.1 cm oval mass in the right breast at the 10 o'clock position. Beside that she has a 0.8 cm cyst.  A right breast biopsy on 03/22/2015 - [id number] - showed a invasive ductal carcinoma, her2neu - negative, ER 95%, PR 80%, Ki67 - 15%  She has been on Premarin. I advised her to stop this.  I discussed the options for breast cancer treatment with the patient. She is at the Breast multidisciplinary clinic, which includes medical oncology and radiation oncology. I discussed the surgical options of lumpectomy vs. mastectomy. If mastectomy, there is the possibility of reconstruction. I discussed the options of lymph node biopsy. The treatment plan depends on the pathologic staging of the tumor and the patient's personal wishes. The risks of surgery include, but are not limited to, bleeding, infection, the need for further surgery, and nerve injury. The patient has been given literature on the treatment of breast cancer and her path report.  Plan: 1) right lumpectomy (seed localization) and right axillary SLNBx, 2) Oncotype,  3) rad tx, 4) anti-hormone  Past Medical History: 1. Type 2 diabetes - on Trulicity Sees Dr. Eduard Clos 2. Hypercholesterolemia 3. GERD 4. HTN 5. History of right total knee - 07/18/2013 - [name] Had right shoulder surgery and is doing okay with this. Has left shoulder pain and is trying to hold off on surgery 6. History of abd hysterectomy  Social History:   Divorced. 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 Has 2 children: daughter (69yo) lives here, son 70 yo) lives in [name]   Other Problems Conni Slipper, RN; 03/31/2015 7:54 AM) Arthritis Diabetes Mellitus Gastroesophageal Reflux Disease Hypercholesterolemia  Past Surgical History Conni Slipper, RN; 03/31/2015 7:54 AM) Cesarean Section - Multiple Hysterectomy (not due to cancer) - Complete Knee Surgery Right. Shoulder Surgery Right.  Diagnostic Studies History Conni Slipper, RN; 03/31/2015 7:54 AM) Colonoscopy within last year Mammogram within last year Pap Smear 1-5 years ago  Medication History Conni Slipper, RN; 03/31/2015 7:54 AM) No Current Medications Medications Reconciled  Social History Conni Slipper, RN; 03/31/2015 7:54 AM) Alcohol use Moderate alcohol use. Caffeine use Carbonated beverages. No drug use Tobacco use Never smoker.  Family History Conni Slipper, RN; 03/31/2015 7:54 AM) Diabetes Mellitus Brother, Sister. Hypertension Sister. Prostate Cancer Brother.  Pregnancy / Birth History Conni Slipper, RN; 03/31/2015 7:54 AM) Age at menarche 9 years. Gravida 4 Maternal age 51-20 Para 2    Review of Systems Conni Slipper RN; 03/31/2015 7:54 AM) General Present- Chills and  Fever. Not Present- Appetite Loss, Fatigue, Night Sweats, Weight Gain and Weight Loss. Skin Not Present- Change in Wart/Mole, Dryness, Hives, Jaundice, New Lesions, Non-Healing Wounds, Rash  and Ulcer. HEENT Present- Earache, Seasonal Allergies and Sore Throat. Not Present- Hearing Loss, Hoarseness, Nose Bleed, Oral Ulcers, Ringing in the Ears, Sinus Pain, Visual Disturbances, Wears glasses/contact lenses and Yellow Eyes. Respiratory Not Present- Bloody sputum, Chronic Cough, Difficulty Breathing, Snoring and Wheezing. Breast Present- Breast Pain. Not Present- Breast Mass, Nipple Discharge and Skin Changes. Cardiovascular Not Present- Chest Pain, Difficulty Breathing Lying Down, Leg Cramps, Palpitations, Rapid Heart Rate, Shortness of Breath and Swelling of Extremities. Gastrointestinal Not Present- Abdominal Pain, Bloating, Bloody Stool, Change in Bowel Habits, Chronic diarrhea, Constipation, Difficulty Swallowing, Excessive gas, Gets full quickly at meals, Hemorrhoids, Indigestion, Nausea, Rectal Pain and Vomiting. Female Genitourinary Not Present- Frequency, Nocturia, Painful Urination, Pelvic Pain and Urgency. Musculoskeletal Not Present- Back Pain, Joint Pain, Joint Stiffness, Muscle Pain, Muscle Weakness and Swelling of Extremities. Neurological Present- Tingling. Not Present- Decreased Memory, Fainting, Headaches, Numbness, Seizures, Tremor, Trouble walking and Weakness. Psychiatric Not Present- Anxiety, Bipolar, Change in Sleep Pattern, Depression, Fearful and Frequent crying. Endocrine Present- Hot flashes. Not Present- Cold Intolerance, Excessive Hunger, Hair Changes, Heat Intolerance and New Diabetes. Hematology Not Present- Easy Bruising, Excessive bleeding, Gland problems, HIV and Persistent Infections.   Physical Exam  General: WN AA F alert and generally healthy appearing. HEENT: Normal. Pupils equal. Has false eyelashes.  Neck: Supple. No mass. No thyroid mass.  Lymph Nodes: No supraclavicular, cervical or axillary nodes.  Lungs: Clear to auscultation and symmetric breath sounds. Heart: RRR. No murmur or rub.  Breast: Right - Bruise at 8 o'clock in the right  breast. Her breasts are moderately large. I feel no mass. Left - No mass in the breast  Abdomen: Soft. No mass. No tenderness. No hernia. Normal bowel sounds. Lower midline scar from C section. Appendectomy scar.  Extremities: Good strength and ROM in upper and lower extremities.  Neurologic: Grossly intact to motor and sensory function. Psychiatric: Has normal mood and affect. Behavior is normal.    Assessment & Plan  1.  BREAST CANCER, STAGE 1, RIGHT (C50.911)  Story: right breast biopsy on 03/22/2015 - [id number] - showed a invasive ductal carcinoma, her2neu - negative, ER 95%, PR 80%, Ki67 - 15%   Treating oncologist - Magrinat and Pablo Ledger Impression: Plan:   1) right lumpectomy (seed localization) and right axillary SLNBx,   2) Oncotype,   3) rad tx,   4) anti-hormone  2.  TYPE 2 DIABETES MELLITUS WITHOUT COMPLICATION, WITHOUT LONG-TERM CURRENT USE OF INSULIN (E11.9)  Impression: Sees Dr. Dwyane Dee  On trulicity  3. Hypercholesterolemia 4. GERD 5. HTN 6. History of right total knee - 07/18/2013 - [name]  Had right shoulder surgery and is doing okay with this.  Has left shoulder pain and is trying to hold off on surgery 7. History of abd hysterectomy   Alphonsa Overall, MD, [facility] Pager: [phone] Office phone:  [phone]

## 2015-04-16 ENCOUNTER — Ambulatory Visit (HOSPITAL_BASED_OUTPATIENT_CLINIC_OR_DEPARTMENT_OTHER)
Admission: RE | Admit: 2015-04-16 | Discharge: 2015-04-16 | Disposition: A | Discharge status: Home / Self Care | Payer: Medicare Other | Source: Ambulatory Visit | Attending: Surgery | Admitting: Surgery

## 2015-04-16 ENCOUNTER — Ambulatory Visit (HOSPITAL_BASED_OUTPATIENT_CLINIC_OR_DEPARTMENT_OTHER): Payer: Medicare Other | Admitting: Anesthesiology

## 2015-04-16 ENCOUNTER — Encounter (HOSPITAL_BASED_OUTPATIENT_CLINIC_OR_DEPARTMENT_OTHER)
Admission: RE | Disposition: A | Discharge status: Home / Self Care | Payer: Self-pay | Source: Ambulatory Visit | Attending: Surgery

## 2015-04-16 ENCOUNTER — Encounter (HOSPITAL_BASED_OUTPATIENT_CLINIC_OR_DEPARTMENT_OTHER): Payer: Self-pay

## 2015-04-16 ENCOUNTER — Encounter (HOSPITAL_COMMUNITY)
Admission: RE | Admit: 2015-04-16 | Discharge: 2015-04-16 | Disposition: A | Discharge status: Home / Self Care | Payer: Medicare Other | Source: Ambulatory Visit | Attending: Surgery | Admitting: Surgery

## 2015-04-16 DIAGNOSIS — Z7984 Long term (current) use of oral hypoglycemic drugs: Secondary | ICD-10-CM | POA: Insufficient documentation

## 2015-04-16 DIAGNOSIS — E7800 Pure hypercholesterolemia, unspecified: Secondary | ICD-10-CM | POA: Insufficient documentation

## 2015-04-16 DIAGNOSIS — Z170 Estrogen receptor positive status [ER+]: Secondary | ICD-10-CM | POA: Insufficient documentation

## 2015-04-16 DIAGNOSIS — C50411 Malignant neoplasm of upper-outer quadrant of right female breast: Secondary | ICD-10-CM | POA: Diagnosis not present

## 2015-04-16 DIAGNOSIS — Z6832 Body mass index (BMI) 32.0-32.9, adult: Secondary | ICD-10-CM | POA: Insufficient documentation

## 2015-04-16 DIAGNOSIS — E119 Type 2 diabetes mellitus without complications: Secondary | ICD-10-CM | POA: Insufficient documentation

## 2015-04-16 DIAGNOSIS — Z96651 Presence of right artificial knee joint: Secondary | ICD-10-CM | POA: Diagnosis not present

## 2015-04-16 DIAGNOSIS — C50911 Malignant neoplasm of unspecified site of right female breast: Secondary | ICD-10-CM

## 2015-04-16 DIAGNOSIS — Z79890 Hormone replacement therapy (postmenopausal): Secondary | ICD-10-CM | POA: Insufficient documentation

## 2015-04-16 DIAGNOSIS — Z7982 Long term (current) use of aspirin: Secondary | ICD-10-CM | POA: Diagnosis not present

## 2015-04-16 DIAGNOSIS — I10 Essential (primary) hypertension: Secondary | ICD-10-CM | POA: Insufficient documentation

## 2015-04-16 DIAGNOSIS — K219 Gastro-esophageal reflux disease without esophagitis: Secondary | ICD-10-CM | POA: Diagnosis not present

## 2015-04-16 DIAGNOSIS — Z79899 Other long term (current) drug therapy: Secondary | ICD-10-CM | POA: Diagnosis not present

## 2015-04-16 HISTORY — PX: RADIOACTIVE SEED GUIDED PARTIAL MASTECTOMY WITH AXILLARY SENTINEL LYMPH NODE BIOPSY: SHX6520

## 2015-04-16 LAB — GLUCOSE, CAPILLARY
Glucose-Capillary: 110 mg/dL — ABNORMAL HIGH (ref 65–99)
Glucose-Capillary: 103 mg/dL — ABNORMAL HIGH (ref 65–99)

## 2015-04-16 SURGERY — RADIOACTIVE SEED GUIDED PARTIAL MASTECTOMY WITH AXILLARY SENTINEL LYMPH NODE BIOPSY
Anesthesia: General | Site: Breast | Laterality: Right

## 2015-04-16 MED ORDER — FENTANYL CITRATE (PF) 100 MCG/2ML IJ SOLN
INTRAMUSCULAR | Status: AC
  Filled 2015-04-16: qty 2

## 2015-04-16 MED ORDER — BUPIVACAINE-EPINEPHRINE (PF) 0.5% -1:200000 IJ SOLN
INTRAMUSCULAR | Status: DC | PRN
  Administered 2015-04-16: 20 mL

## 2015-04-16 MED ORDER — CHLORHEXIDINE GLUCONATE 4 % EX LIQD: 1.0000 "application " | Freq: Once | CUTANEOUS | Status: DC

## 2015-04-16 MED ORDER — HYDROCODONE-ACETAMINOPHEN 5-325 MG PO TABS: 1.0000 | ORAL_TABLET | Freq: Four times a day (QID) | ORAL | Status: DC | PRN

## 2015-04-16 MED ORDER — SCOPOLAMINE 1 MG/3DAYS TD PT72
1.0000 | MEDICATED_PATCH | Freq: Once | TRANSDERMAL | Status: DC | PRN
  Administered 2015-04-16: 1.5 mg via TRANSDERMAL

## 2015-04-16 MED ORDER — MIDAZOLAM HCL 2 MG/2ML IJ SOLN
INTRAMUSCULAR | Status: AC
  Filled 2015-04-16: qty 2

## 2015-04-16 MED ORDER — FENTANYL CITRATE (PF) 100 MCG/2ML IJ SOLN
50.0000 ug | INTRAMUSCULAR | Status: AC | PRN
  Administered 2015-04-16 (×3): 50 ug via INTRAVENOUS
  Administered 2015-04-16: 100 ug via INTRAVENOUS
  Administered 2015-04-16: 50 ug via INTRAVENOUS

## 2015-04-16 MED ORDER — 0.9 % SODIUM CHLORIDE (POUR BTL) OPTIME
TOPICAL | Status: DC | PRN
  Administered 2015-04-16: 400 mL

## 2015-04-16 MED ORDER — DEXAMETHASONE SODIUM PHOSPHATE 4 MG/ML IJ SOLN
INTRAMUSCULAR | Status: DC | PRN
  Administered 2015-04-16: 10 mg via INTRAVENOUS

## 2015-04-16 MED ORDER — DEXAMETHASONE SODIUM PHOSPHATE 10 MG/ML IJ SOLN
INTRAMUSCULAR | Status: AC
  Filled 2015-04-16: qty 1

## 2015-04-16 MED ORDER — HYDROMORPHONE HCL 1 MG/ML IJ SOLN
INTRAMUSCULAR | Status: AC
  Filled 2015-04-16: qty 1

## 2015-04-16 MED ORDER — GLYCOPYRROLATE 0.2 MG/ML IJ SOLN: 0.2000 mg | Freq: Once | INTRAMUSCULAR | Status: DC | PRN

## 2015-04-16 MED ORDER — MEPERIDINE HCL 25 MG/ML IJ SOLN: 6.2500 mg | INTRAMUSCULAR | Status: DC | PRN

## 2015-04-16 MED ORDER — PROPOFOL 10 MG/ML IV BOLUS
INTRAVENOUS | Status: DC | PRN
  Administered 2015-04-16 (×2): 50 mg via INTRAVENOUS
  Administered 2015-04-16: 250 mg via INTRAVENOUS

## 2015-04-16 MED ORDER — BUPIVACAINE-EPINEPHRINE (PF) 0.5% -1:200000 IJ SOLN
INTRAMUSCULAR | Status: DC | PRN
  Administered 2015-04-16: 25 mL via PERINEURAL

## 2015-04-16 MED ORDER — TECHNETIUM TC 99M SULFUR COLLOID FILTERED
1.0000 | Freq: Once | INTRAVENOUS | Status: AC | PRN
  Administered 2015-04-16: 1 via INTRADERMAL

## 2015-04-16 MED ORDER — ONDANSETRON HCL 4 MG/2ML IJ SOLN
INTRAMUSCULAR | Status: DC | PRN
  Administered 2015-04-16: 4 mg via INTRAVENOUS

## 2015-04-16 MED ORDER — CEFAZOLIN SODIUM-DEXTROSE 2-3 GM-% IV SOLR: 2.0000 g | INTRAVENOUS | Status: DC

## 2015-04-16 MED ORDER — EPHEDRINE SULFATE 50 MG/ML IJ SOLN
INTRAMUSCULAR | Status: DC | PRN
  Administered 2015-04-16: 10 mg via INTRAVENOUS

## 2015-04-16 MED ORDER — MIDAZOLAM HCL 2 MG/2ML IJ SOLN
1.0000 mg | INTRAMUSCULAR | Status: DC | PRN
  Administered 2015-04-16: 2 mg via INTRAVENOUS

## 2015-04-16 MED ORDER — CEFAZOLIN SODIUM-DEXTROSE 2-3 GM-% IV SOLR
INTRAVENOUS | Status: AC
  Filled 2015-04-16: qty 50

## 2015-04-16 MED ORDER — OXYCODONE HCL 5 MG PO TABS
ORAL_TABLET | ORAL | Status: AC
  Filled 2015-04-16: qty 1

## 2015-04-16 MED ORDER — OXYCODONE HCL 5 MG PO TABS
5.0000 mg | ORAL_TABLET | Freq: Once | ORAL | Status: AC | PRN
  Administered 2015-04-16: 5 mg via ORAL

## 2015-04-16 MED ORDER — LACTATED RINGERS IV SOLN
INTRAVENOUS | Status: DC
  Administered 2015-04-16 (×2): via INTRAVENOUS
  Administered 2015-04-16: 10 mL/h via INTRAVENOUS

## 2015-04-16 MED ORDER — OXYCODONE HCL 5 MG/5ML PO SOLN: 5.0000 mg | Freq: Once | ORAL | Status: AC | PRN

## 2015-04-16 MED ORDER — ONDANSETRON HCL 4 MG/2ML IJ SOLN
INTRAMUSCULAR | Status: AC
  Filled 2015-04-16: qty 2

## 2015-04-16 MED ORDER — LIDOCAINE HCL (CARDIAC) 20 MG/ML IV SOLN
INTRAVENOUS | Status: DC | PRN
  Administered 2015-04-16: 60 mg via INTRAVENOUS

## 2015-04-16 MED ORDER — SCOPOLAMINE 1 MG/3DAYS TD PT72
MEDICATED_PATCH | TRANSDERMAL | Status: AC
  Filled 2015-04-16: qty 1

## 2015-04-16 MED ORDER — HYDROMORPHONE HCL 1 MG/ML IJ SOLN
0.2500 mg | INTRAMUSCULAR | Status: DC | PRN
  Administered 2015-04-16 (×2): 0.5 mg via INTRAVENOUS

## 2015-04-16 SURGICAL SUPPLY — 58 items
APL SKNCLS STERI-STRIP NONHPOA (GAUZE/BANDAGES/DRESSINGS)
BENZOIN TINCTURE PRP APPL 2/3 (GAUZE/BANDAGES/DRESSINGS) IMPLANT
BINDER BREAST LRG (GAUZE/BANDAGES/DRESSINGS) IMPLANT
BINDER BREAST MEDIUM (GAUZE/BANDAGES/DRESSINGS) IMPLANT
BINDER BREAST XLRG (GAUZE/BANDAGES/DRESSINGS) ×2 IMPLANT
BINDER BREAST XXLRG (GAUZE/BANDAGES/DRESSINGS) IMPLANT
BLADE HEX COATED 2.75 (ELECTRODE) ×3 IMPLANT
BLADE SURG 10 STRL SS (BLADE) ×3 IMPLANT
BLADE SURG 15 STRL LF DISP TIS (BLADE) ×1 IMPLANT
BLADE SURG 15 STRL SS (BLADE) ×3
CANISTER SUC SOCK COL 7IN (MISCELLANEOUS) ×1 IMPLANT
CANISTER SUCT 1200ML W/VALVE (MISCELLANEOUS) ×3 IMPLANT
CHLORAPREP W/TINT 26ML (MISCELLANEOUS) ×3 IMPLANT
CLIP TI WIDE RED SMALL 6 (CLIP) ×3 IMPLANT
CLOSURE WOUND 1/4X4 (GAUZE/BANDAGES/DRESSINGS)
COVER BACK TABLE 60X90IN (DRAPES) ×3 IMPLANT
COVER MAYO STAND STRL (DRAPES) ×3 IMPLANT
COVER PROBE W GEL 5X96 (DRAPES) ×3 IMPLANT
DECANTER SPIKE VIAL GLASS SM (MISCELLANEOUS) IMPLANT
DEVICE DUBIN W/COMP PLATE 8390 (MISCELLANEOUS) ×3 IMPLANT
DRAPE LAPAROSCOPIC ABDOMINAL (DRAPES) ×3 IMPLANT
DRAPE UTILITY XL STRL (DRAPES) ×3 IMPLANT
DRSG PAD ABDOMINAL 8X10 ST (GAUZE/BANDAGES/DRESSINGS) ×2 IMPLANT
ELECT COATED BLADE 2.86 ST (ELECTRODE) ×1 IMPLANT
ELECT REM PT RETURN 9FT ADLT (ELECTROSURGICAL) ×3
ELECTRODE REM PT RTRN 9FT ADLT (ELECTROSURGICAL) ×1 IMPLANT
GAUZE SPONGE 4X4 12PLY STRL (GAUZE/BANDAGES/DRESSINGS) ×3 IMPLANT
GLOVE BIOGEL PI IND STRL 7.0 (GLOVE) IMPLANT
GLOVE BIOGEL PI INDICATOR 7.0 (GLOVE) ×2
GLOVE ECLIPSE 6.5 STRL STRAW (GLOVE) ×2 IMPLANT
GLOVE EXAM NITRILE EXT CUFF MD (GLOVE) ×2 IMPLANT
GLOVE SURG SIGNA 7.5 PF LTX (GLOVE) ×5 IMPLANT
GOWN STRL REUS W/ TWL LRG LVL3 (GOWN DISPOSABLE) ×1 IMPLANT
GOWN STRL REUS W/ TWL XL LVL3 (GOWN DISPOSABLE) ×1 IMPLANT
GOWN STRL REUS W/TWL LRG LVL3 (GOWN DISPOSABLE) ×3
GOWN STRL REUS W/TWL XL LVL3 (GOWN DISPOSABLE) ×3
KIT MARKER MARGIN INK (KITS) ×3 IMPLANT
LIQUID BAND (GAUZE/BANDAGES/DRESSINGS) ×2 IMPLANT
NDL HYPO 25X1 1.5 SAFETY (NEEDLE) ×1 IMPLANT
NDL SAFETY ECLIPSE 18X1.5 (NEEDLE) IMPLANT
NEEDLE HYPO 18GX1.5 SHARP (NEEDLE)
NEEDLE HYPO 25X1 1.5 SAFETY (NEEDLE) ×3 IMPLANT
NS IRRIG 1000ML POUR BTL (IV SOLUTION) ×3 IMPLANT
PACK BASIN DAY SURGERY FS (CUSTOM PROCEDURE TRAY) ×3 IMPLANT
PENCIL BUTTON HOLSTER BLD 10FT (ELECTRODE) ×3 IMPLANT
SHEET MEDIUM DRAPE 40X70 STRL (DRAPES) ×3 IMPLANT
SLEEVE SCD COMPRESS KNEE MED (MISCELLANEOUS) ×3 IMPLANT
SPONGE GAUZE 4X4 12PLY STER LF (GAUZE/BANDAGES/DRESSINGS) IMPLANT
SPONGE LAP 18X18 X RAY DECT (DISPOSABLE) ×2 IMPLANT
STRIP CLOSURE SKIN 1/4X4 (GAUZE/BANDAGES/DRESSINGS) IMPLANT
SUT MNCRL AB 4-0 PS2 18 (SUTURE) ×2 IMPLANT
SUT VICRYL 3-0 CR8 SH (SUTURE) ×5 IMPLANT
SYR CONTROL 10ML LL (SYRINGE) ×3 IMPLANT
TOWEL OR 17X24 6PK STRL BLUE (TOWEL DISPOSABLE) ×3 IMPLANT
TOWEL OR NON WOVEN STRL DISP B (DISPOSABLE) ×1 IMPLANT
TUBE CONNECTING 20'X1/4 (TUBING) ×1
TUBE CONNECTING 20X1/4 (TUBING) ×2 IMPLANT
YANKAUER SUCT BULB TIP NO VENT (SUCTIONS) ×3 IMPLANT

## 2015-04-16 NOTE — Anesthesia Postprocedure Evaluation (Signed)
Anesthesia Post Note  Patient: [name]  Procedure(s) Performed: Procedure(s) (LRB): RADIOACTIVE SEED GUIDED PARTIAL MASTECTOMY WITH AXILLARY SENTINEL LYMPH NODE BIOPSY (Right)  Patient location during evaluation: PACU Anesthesia Type: General Level of consciousness: awake and alert Pain management: pain level controlled Vital Signs Assessment: post-procedure vital signs reviewed and stable Respiratory status: spontaneous breathing, nonlabored ventilation and respiratory function stable Cardiovascular status: blood pressure returned to baseline and stable Postop Assessment: no signs of nausea or vomiting Anesthetic complications: no    Last Vitals:  Filed Vitals:   04/16/15 1230 04/16/15 1242  BP: 145/74 141/65  Pulse: 80 81  Temp:  36.6 C  Resp: 15 20    Last Pain:  Filed Vitals:   04/16/15 1242  PainSc: 4                  Ryllie Nieland A

## 2015-04-16 NOTE — Anesthesia Preprocedure Evaluation (Signed)
Anesthesia Evaluation  Patient identified by MRN, date of birth, ID band Patient awake    Reviewed: Allergy & Precautions, NPO status , Patient's Chart, lab work & pertinent test results  Airway Mallampati: I  TM Distance: >3 FB Neck ROM: Full    Dental  (+) Teeth Intact, Dental Advisory Given   Pulmonary    breath sounds clear to auscultation       Cardiovascular  Rhythm:Regular Rate:Normal     Neuro/Psych    GI/Hepatic GERD  Medicated and Controlled,  Endo/Other  diabetes, Well Controlled, Type 2Morbid obesity  Renal/GU      Musculoskeletal   Abdominal   Peds  Hematology   Anesthesia Other Findings   Reproductive/Obstetrics                             Anesthesia Physical Anesthesia Plan  ASA: III  Anesthesia Plan: General   Post-op Pain Management: MAC Combined w/ Regional for Post-op pain   Induction: Intravenous  Airway Management Planned: LMA  Additional Equipment:   Intra-op Plan:   Post-operative Plan: Extubation in OR  Informed Consent: I have reviewed the patients History and Physical, chart, labs and discussed the procedure including the risks, benefits and alternatives for the proposed anesthesia with the patient or authorized representative who has indicated his/her understanding and acceptance.   Dental advisory given  Plan Discussed with: CRNA, Anesthesiologist and Surgeon  Anesthesia Plan Comments:         Anesthesia Quick Evaluation

## 2015-04-16 NOTE — Interval H&P Note (Signed)
History and Physical Interval Note:  04/16/2015 10:02 AM  Amanda [name]  has presented today for surgery, with the diagnosis of RIGHT BREAST CANCER  The various methods of treatment have been discussed with the patient and family.  Her daughter, Mordecai Rasmussen, and sister in law, Mardene Celeste, are with her.  I checked her breast and seed is localized.  After consideration of risks, benefits and other options for treatment, the patient has consented to  Procedure(s): RADIOACTIVE SEED GUIDED PARTIAL MASTECTOMY WITH AXILLARY SENTINEL LYMPH NODE BIOPSY (Right) as a surgical intervention .  The patient's history has been reviewed, patient examined, no change in status, stable for surgery.  I have reviewed the patient's chart and labs.  Questions were answered to the patient's satisfaction.     [name]

## 2015-04-16 NOTE — Progress Notes (Signed)
Assisted Dr. Al Corpus with right, ultrasound guided, pectoralis block and nuc med tech # [phone] with nuc med ionj . Side rails up, monitors on throughout procedure. See vital signs in flow sheet. Tolerated Procedure well.

## 2015-04-16 NOTE — Anesthesia Procedure Notes (Addendum)
Anesthesia Regional Block:  Pectoralis block  Pre-Anesthetic Checklist: ,, timeout performed, Correct Patient, Correct Site, Correct Laterality, Correct Procedure, Correct Position, site marked, Risks and benefits discussed,  Surgical consent,  Pre-op evaluation,  At surgeon's request and post-op pain management  Laterality: Right and Upper  Prep: chloraprep       Needles:  Injection technique: Single-shot  Needle Type: Echogenic Needle     Needle Length: 9cm 9 cm Needle Gauge: 21 and 21 G    Additional Needles:  Procedures: ultrasound guided (picture in chart) Pectoralis block Narrative:  Start time: 04/16/2015 9:05 AM End time: 04/16/2015 9:10 AM Injection made incrementally with aspirations every 5 mL.  Performed by: Personally  Anesthesiologist: CREWS, DAVID   Procedure Name: LMA Insertion Date/Time: 04/16/2015 10:16 AM Performed by: Maryella Shivers Pre-anesthesia Checklist: Patient identified, Emergency Drugs available, Suction available and Patient being monitored Patient Re-evaluated:Patient Re-evaluated prior to inductionOxygen Delivery Method: Circle System Utilized Preoxygenation: Pre-oxygenation with 100% oxygen Intubation Type: IV induction Ventilation: Mask ventilation without difficulty LMA: LMA inserted LMA Size: 4.0 Number of attempts: 1 Airway Equipment and Method: bite block Placement Confirmation: positive ETCO2 Tube secured with: Tape Dental Injury: Teeth and Oropharynx as per pre-operative assessment       Right PEC block image

## 2015-04-16 NOTE — Transfer of Care (Signed)
Immediate Anesthesia Transfer of Care Note  Patient: Amanda [name]  Procedure(s) Performed: Procedure(s): RADIOACTIVE SEED GUIDED PARTIAL MASTECTOMY WITH AXILLARY SENTINEL LYMPH NODE BIOPSY (Right)  Patient Location: PACU  Anesthesia Type:GA combined with regional for post-op pain  Level of Consciousness: awake and alert   Airway & Oxygen Therapy: Patient Spontanous Breathing and Patient connected to face mask oxygen  Post-op Assessment: Report given to RN and Post -op Vital signs reviewed and stable  Post vital signs: Reviewed and stable  Last Vitals:  Filed Vitals:   04/16/15 0910 04/16/15 0915  BP: 110/63 130/61  Pulse: 65 70  Temp:    Resp: 11 17    Complications: No apparent anesthesia complications

## 2015-04-16 NOTE — Discharge Instructions (Signed)
[facility] - DISCHARGE INSTRUCTIONS TO PATIENT  Activity:  Driving - May drive in 2 or 3 days   Lifting - No lifting more that 15 pounds for one week, then no limit  Wound Care:   Leave bandage on for 2 days, then remove bandage and shower  Diet:  As tolerated  Follow up appointment:  Call Dr. Pollie Friar office [facility]) at [phone] for an appointment in 2 weeks  Medications and dosages:  Resume your home medications.  You have a prescription for:  Vicodin  Call Dr. Lucia Gaskins or his office  [phone]) if you have:  Temperature greater than 100.4,  Persistent nausea and vomiting,  Severe uncontrolled pain,  Redness, tenderness, or signs of infection (pain, swelling, redness, odor or green/yellow discharge around the site),  Difficulty breathing, headache or visual disturbances,  Any other questions or concerns you may have after discharge.  In an emergency, call 911 or go to an Emergency Department at a nearby hospital.    Post Anesthesia Home Care Instructions  Activity: Get plenty of rest for the remainder of the day. A responsible adult should stay with you for 24 hours following the procedure.  For the next 24 hours, DO NOT: -Drive a car -Paediatric nurse -Drink alcoholic beverages -Take any medication unless instructed by your physician -Make any legal decisions or sign important papers.  Meals: Start with liquid foods such as gelatin or soup. Progress to regular foods as tolerated. Avoid greasy, spicy, heavy foods. If nausea and/or vomiting occur, drink only clear liquids until the nausea and/or vomiting subsides. Call your physician if vomiting continues.  Special Instructions/Symptoms: Your throat may feel dry or sore from the anesthesia or the breathing tube placed in your throat during surgery. If this causes discomfort, gargle with warm salt water. The discomfort should disappear within 24 hours.  If you had a scopolamine patch  placed behind your ear for the management of post- operative nausea and/or vomiting:  1. The medication in the patch is effective for 72 hours, after which it should be removed.  Wrap patch in a tissue and discard in the trash. Wash hands thoroughly with soap and water. 2. You may remove the patch earlier than 72 hours if you experience unpleasant side effects which may include dry mouth, dizziness or visual disturbances. 3. Avoid touching the patch. Wash your hands with soap and water after contact with the patch.    Post Anesthesia Home Care Instructions  Activity: Get plenty of rest for the remainder of the day. A responsible adult should stay with you for 24 hours following the procedure.  For the next 24 hours, DO NOT: -Drive a car -Paediatric nurse -Drink alcoholic beverages -Take any medication unless instructed by your physician -Make any legal decisions or sign important papers.  Meals: Start with liquid foods such as gelatin or soup. Progress to regular foods as tolerated. Avoid greasy, spicy, heavy foods. If nausea and/or vomiting occur, drink only clear liquids until the nausea and/or vomiting subsides. Call your physician if vomiting continues.  Special Instructions/Symptoms: Your throat may feel dry or sore from the anesthesia or the breathing tube placed in your throat during surgery. If this causes discomfort, gargle with warm salt water. The discomfort should disappear within 24 hours.  If you had a scopolamine patch placed behind your ear for the management of post- operative nausea and/or vomiting:  1. The medication in the patch is effective for 72 hours, after which it should  be removed.  Wrap patch in a tissue and discard in the trash. Wash hands thoroughly with soap and water. 2. You may remove the patch earlier than 72 hours if you experience unpleasant side effects which may include dry mouth, dizziness or visual disturbances. 3. Avoid touching the patch. Wash  your hands with soap and water after contact with the patch.

## 2015-04-16 NOTE — Op Note (Signed)
04/16/2015  11:46 AM  PATIENT:  Amanda [name] DOB: Mar 26, 1945 MRN: [id number]  PREOP DIAGNOSIS:  RIGHT BREAST CANCER  POSTOP DIAGNOSIS:   Right breast cancer, 9 o'clock position (T1, N0)  PROCEDURE:   Procedure(s): RADIOACTIVE SEED GUIDED PARTIAL MASTECTOMY WITH AXILLARY SENTINEL LYMPH NODE BIOPSY  SURGEON:   Alphonsa Overall, M.D.  ANESTHESIA:   general  Anesthesiologist: Lorrene Reid, MD CRNA: Maryella Shivers, CRNA; Willa Frater, CRNA  General  EBL:  < 50  ml  DRAINS: none   LOCAL MEDICATIONS USED:   20 cc 1/2% marcaine, pectoral block  SPECIMEN:   Right breast lumpectomy (suture medial), posterior margin (suture medial), superior margin, (suture medial), right axillary node (counts 800/background 0)  COUNTS CORRECT:  YES  INDICATIONS FOR PROCEDURE:  Amanda [name] is a 70 y.o. (DOB: 03-24-1945) AA  female whose primary care physician is Gara Kroner, MD and comes for right breast lumpectomy and right axillary sentinel lymph node biopsy.   She [facility] on 03/31/2015 with right breast cancer.  She was seen by oncologist Drs. Magrinat and Pablo Ledger.   The options for breast cancer treatment have been discussed with the patient. She elected to proceed with lumpectomy and axillary sentinel lymph node.     The indications and potential complications of surgery were explained to the patient. Potential complications include, but are not limited to, bleeding, infection, the need for further surgery, and nerve injury.     She had a I131 seed placed on 04/14/2015 in her right breast at [facility].  I confirmed the presence of the I131 seed in the pre op area using the Neoprobe.  The seed is in the 9 o'clock position of the right breast.   In the holding area, her right areola was injected with 1 millicurie of Technitium Sulfur Colloid.  OPERATIVE NOTE:   The patient was taken to room # 8 at [facility] where she underwent a general anesthesia   supervised by Anesthesiologist: Lorrene Reid, MD CRNA: Maryella Shivers, CRNA; Willa Frater, CRNA. She did have a right pectoral block. Her right breast and axilla were prepped with  ChloraPrep and sterilely draped.    A time-out and the surgical check list was reviewed.   I found a sentinel lymph node with the Neoprobe alone, so I did not inject her nipple.   I turned attention to the cancer which was about at the 9 o'clock position of the right breast.   I used the Neoprobe to identify the I131 seed.  I tried to excise an area around the tumor of at least 1 cm.    I excised this block of breast tissue approximately 4 cm by 5 cm  in diameter.   I painted the lumpectomy specimen with the 6 color paint kit and did a specimen mammogram which confirmed the mass, clip, and the seed were all in the right position in the specimen.  The specimen was sent to pathology who called back to confirm that they have the seed and the specimen.   The tumor was somewhat deeper than I expected, so I excised the deep margin, put a suture in it medially, and painted it.  I also excised the superior margin, put a suture in it medially, and painted it.  These were sent as separate specimen.  I took my dissection to the pectoralis with the deep margin excision.   I then started the excised the deep margin, put  a suture in it medially, and painted it.  axillary sentinel lymph node biopsy. I made an incision in the excised the deep margin, put a suture in it medially, and painted it.  axilla.  I found a hot area at the junction of the breast and the pectoralis major muscle. I cut down and  identified a hot node that had counts of 800 and the background has 0 counts.  I checked her internal mammary nodes and supraclavicular nodes with the neoprobe and found no other hot area. The axillary node was then sent to pathology.    I then irrigated the wound with saline. I infiltrated approximately 30 mL of 1% local between the incisions.   I placed 6 clips to mark biopsy cavity, at 12, 3, 6, and 9 o'clock. Two clips were placed on the pectoralis major.   I then closed all the wounds in layers using 3-0 Vicryl sutures for the deep layer. At the skin, I closed the incisions with a 4-0 Monocryl suture. The incisions were then painted with LiquiBand.  She had gauze place over the wounds and placed in a breast binder.   The patient tolerated the procedure well, was transported to the recovery room in good condition. Sponge and needle count were correct at the end of the case.   Final pathology is pending.   Alphonsa Overall, MD, [facility] Pager: [phone] Office phone:  [phone]

## 2015-04-19 ENCOUNTER — Encounter (HOSPITAL_BASED_OUTPATIENT_CLINIC_OR_DEPARTMENT_OTHER): Payer: Self-pay | Admitting: Surgery

## 2015-04-20 ENCOUNTER — Encounter: Payer: Self-pay | Admitting: *Deleted

## 2015-04-20 NOTE — Progress Notes (Signed)
Ordered oncotype per Dr. Magrinat. Faxed PA to [employer] and faxed requisition to pathology and confirmed receipt.  

## 2015-04-29 ENCOUNTER — Other Ambulatory Visit (INDEPENDENT_AMBULATORY_CARE_PROVIDER_SITE_OTHER): Payer: BLUE CROSS/BLUE SHIELD

## 2015-04-29 ENCOUNTER — Encounter (HOSPITAL_COMMUNITY): Payer: Self-pay

## 2015-04-29 DIAGNOSIS — Z23 Encounter for immunization: Secondary | ICD-10-CM

## 2015-04-29 LAB — URINALYSIS, ROUTINE W REFLEX MICROSCOPIC
Bilirubin Urine: NEGATIVE
HGB URINE DIPSTICK: NEGATIVE
KETONES UR: NEGATIVE
LEUKOCYTES UA: NEGATIVE
Nitrite: NEGATIVE
RBC / HPF: NONE SEEN (ref 0–?)
Specific Gravity, Urine: 1.02 (ref 1.000–1.030)
Total Protein, Urine: NEGATIVE
URINE GLUCOSE: NEGATIVE
Urobilinogen, UA: 0.2 (ref 0.0–1.0)
pH: 5.5 (ref 5.0–8.0)

## 2015-04-29 LAB — COMPREHENSIVE METABOLIC PANEL
ALK PHOS: 50 U/L (ref 39–117)
ALT: 21 U/L (ref 0–35)
AST: 20 U/L (ref 0–37)
Albumin: 4.1 g/dL (ref 3.5–5.2)
BILIRUBIN TOTAL: 0.3 mg/dL (ref 0.2–1.2)
BUN: 18 mg/dL (ref 6–23)
CO2: 30 meq/L (ref 19–32)
Calcium: 10.2 mg/dL (ref 8.4–10.5)
Chloride: 103 mEq/L (ref 96–112)
Creatinine, Ser: 0.82 mg/dL (ref 0.40–1.20)
GFR: 88.67 mL/min (ref >60.00)
GLUCOSE: 116 mg/dL — AB (ref 70–99)
Potassium: 4.4 mEq/L (ref 3.5–5.1)
SODIUM: 140 meq/L (ref 135–145)
TOTAL PROTEIN: 7.4 g/dL (ref 6.0–8.3)

## 2015-04-29 LAB — HEMOGLOBIN A1C: HEMOGLOBIN A1C: 7.4 % — AB (ref 4.6–6.5)

## 2015-05-03 ENCOUNTER — Telehealth: Payer: Self-pay | Admitting: *Deleted

## 2015-05-03 NOTE — Telephone Encounter (Signed)
Spoke with patient and gave her the results of her oncotype score of 12/8%.  She will not need chemo and is so happy.  Informed her that she will get a call from Dr. Unknown Jim office to schedule an appointment. She is aware of her appointment with Dr. Jana Hakim.

## 2015-05-04 ENCOUNTER — Ambulatory Visit (INDEPENDENT_AMBULATORY_CARE_PROVIDER_SITE_OTHER): Payer: Medicare Other | Admitting: Endocrinology

## 2015-05-04 ENCOUNTER — Encounter: Payer: Self-pay | Admitting: Endocrinology

## 2015-05-04 VITALS — BP 124/72 | HR 71 | Temp 98.1°F | Resp 14 | Ht 62.0 in | Wt 187.4 lb

## 2015-05-04 DIAGNOSIS — E785 Hyperlipidemia, unspecified: Secondary | ICD-10-CM

## 2015-05-04 DIAGNOSIS — E1165 Type 2 diabetes mellitus with hyperglycemia: Secondary | ICD-10-CM

## 2015-05-04 MED ORDER — CANAGLIFLOZIN 100 MG PO TABS: ORAL_TABLET | ORAL | Status: DC

## 2015-05-04 NOTE — Progress Notes (Signed)
Amanda [name] is an 70 y.o. female.             Reason for Appointment: Diabetes follow-up   History of Present Illness   Diagnosis: Type 2 DIABETES MELITUS     Previous history: She has had difficulty with blood sugar control initially because of limited tolerability with metformin and difficulty with weight loss. She did very well with starting Victoza which has helped her blood sugar control as well as some improvement in her obesity Her insurance company did not prefer Victoza and she was switched to Trulicity Subsequently her glipizide was stopped  Recent history:    She has not been seen in follow-up since 10/2014  Her blood sugars are overall higher than her last visit with increased A1c of 7.4, previously 6.5  Current blood sugar patterns, management and problems identified:  Has been taking Trulicity A999333 weekly with no side effects except very mild transient nausea  She thinks her weight has gone up mostly in the last 2 weeks since her breast surgery but was showing a trend to weight gain previously also  Recently has not been changing her diet or increasing her portions and she is concerned about her weight gain in the last few weeks  She has continued to be exercising  Blood sugars are higher although checking blood sugars primarily in the morning and more so in the last 2 weeks     Oral hypoglycemic drugs:   none      Side effects from medications: Diarrhea from metformin.  Nausea from 1.5 mg Trulicity         Monitors blood glucose:  less than Once a day.    Glucometer: One Touch ultra 2             Blood Glucose readings  Mean values apply above for all meters except median for One Touch  PRE-MEAL Fasting Lunch 6-9 PM  Bedtime Overall  Glucose range: 114-160 146, 136  154, 190     Mean/median: 129     135                 Physical activity: exercise: walking on treadmill 4/7 days upto 60 min            Wt Readings from Last 3 Encounters:  05/04/15 187  lb 6.4 oz (85.004 kg)  04/16/15 179 lb (81.194 kg)  03/31/15 179 lb 11.2 oz (81.511 kg)    LABS:  Lab Results  Component Value Date   HGBA1C 7.4* 04/29/2015   HGBA1C 6.5 11/02/2014   HGBA1C 6.6* 04/01/2014   Lab Results  Component Value Date   MICROALBUR <0.7 04/01/2014   LDLCALC 142* 04/01/2014   CREATININE 0.82 04/29/2015     Lab on 04/29/2015  Component Date Value Ref Range Status  . Hgb A1c MFr Bld 04/29/2015 7.4* 4.6 - 6.5 % Final   Glycemic Control Guidelines for People with Diabetes:Non Diabetic:  <6%Goal of Therapy: <7%Additional Action Suggested:  >8%   . Sodium 04/29/2015 140  135 - 145 mEq/L Final  . Potassium 04/29/2015 4.4  3.5 - 5.1 mEq/L Final  . Chloride 04/29/2015 103  96 - 112 mEq/L Final  . CO2 04/29/2015 30  19 - 32 mEq/L Final  . Glucose, Bld 04/29/2015 116* 70 - 99 mg/dL Final  . BUN 04/29/2015 18  6 - 23 mg/dL Final  . Creatinine, Ser 04/29/2015 0.82  0.40 - 1.20 mg/dL Final  . Total Bilirubin 04/29/2015 0.3  0.2 - 1.2 mg/dL Final  . Alkaline Phosphatase 04/29/2015 50  39 - 117 U/L Final  . AST 04/29/2015 20  0 - 37 U/L Final  . ALT 04/29/2015 21  0 - 35 U/L Final  . Total Protein 04/29/2015 7.4  6.0 - 8.3 g/dL Final  . Albumin 04/29/2015 4.1  3.5 - 5.2 g/dL Final  . Calcium 04/29/2015 10.2  8.4 - 10.5 mg/dL Final  . GFR 04/29/2015 88.67  >60.00 mL/min Final  . Color, Urine 04/29/2015 YELLOW  Yellow;Lt. Yellow Final  . APPearance 04/29/2015 Sl Cloudy* Clear Final  . Specific Gravity, Urine 04/29/2015 1.020  1.000-1.030 Final  . pH 04/29/2015 5.5  5.0 - 8.0 Final  . Total Protein, Urine 04/29/2015 NEGATIVE  Negative Final  . Urine Glucose 04/29/2015 NEGATIVE  Negative Final  . Ketones, ur 04/29/2015 NEGATIVE  Negative Final  . Bilirubin Urine 04/29/2015 NEGATIVE  Negative Final  . Hgb urine dipstick 04/29/2015 NEGATIVE  Negative Final  . Urobilinogen, UA 04/29/2015 0.2  0.0 - 1.0 Final  . Leukocytes, UA 04/29/2015 NEGATIVE  Negative Final  .  Nitrite 04/29/2015 NEGATIVE  Negative Final  . WBC, UA 04/29/2015 0-2/hpf  0-2/hpf Final  . RBC / HPF 04/29/2015 none seen  0-2/hpf Final  . Mucus, UA 04/29/2015 Presence of* None Final  . Squamous Epithelial / LPF 04/29/2015 Many(>10/hpf)* Rare(0-4/hpf) Final  . Bacteria, UA 04/29/2015 Rare(<10/hpf)* None Final      Medication List       This list is accurate as of: 05/04/15  4:07 PM.  Always use your most recent med list.               aspirin 81 MG tablet  Take 81 mg by mouth daily.     canagliflozin 100 MG Tabs tablet  Commonly known as:  INVOKANA  1 tablet before breakfast     fexofenadine 180 MG tablet  Commonly known as:  ALLEGRA  Take 180 mg by mouth daily.     HYDROcodone-acetaminophen 5-325 MG tablet  Commonly known as:  NORCO/VICODIN  Take 1-2 tablets by mouth every 6 (six) hours as needed.     montelukast 10 MG tablet  Commonly known as:  SINGULAIR  Take 10 mg by mouth at bedtime.     ONE TOUCH ULTRA TEST test strip  Generic drug:  glucose blood  USE TO TEST BLOOD SUGAR ONE TIME DAILY AS INSTRUCTED     ranitidine 150 MG tablet  Commonly known as:  ZANTAC  Take 150 mg by mouth 2 (two) times daily.     rosuvastatin 10 MG tablet  Commonly known as:  CRESTOR  Take 10 mg by mouth daily.     TRULICITY A999333 0000000 Sopn  Generic drug:  Dulaglutide  INJECT 0.75 MG UNDER THE SKIN ONCE A WEEK     Vitamin D (Ergocalciferol) 50000 units Caps capsule  Commonly known as:  DRISDOL  Take 50,000 Units by mouth every 7 (seven) days.        Allergies:  Allergies  Allergen Reactions  . Meperidine Hcl     REACTION: rash  . Statins Other (See Comments)    Muscle pain. Pt able to take Crestor but not everyday.    Past Medical History  Diagnosis Date  . Diabetes mellitus without complication ([place])   . GERD (gastroesophageal reflux disease)     Pt takes OTC when needed.  . Arthritis   . Carpal tunnel syndrome   . Breast cancer of upper-outer  quadrant of  right female breast ([place]) 03/24/2015  . Hot flashes     Past Surgical History  Procedure Laterality Date  . Shoulder surgery Right   . Appendectomy    . Abdominal hysterectomy    . Tonsillectomy    . Eye surgery Bilateral     Cataracts removed  . Colonoscopy    . Esophagogastroduodenoscopy    . 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 Total knee arthroplasty Right 07/18/2013    Procedure: TOTAL KNEE ARTHROPLASTY;  Surgeon: Alta Corning, MD;  Location: [place];  Service: Orthopedics;  Laterality: Right;  . Joint replacement    . Radioactive seed guided mastectomy with axillary sentinel lymph node biopsy Right 04/16/2015    Procedure: RADIOACTIVE SEED GUIDED PARTIAL MASTECTOMY WITH AXILLARY SENTINEL LYMPH NODE BIOPSY;  Surgeon: Alphonsa Overall, MD;  Location: [place];  Service: General;  Laterality: Right;    No family history on file.  Social History:  reports that she has never smoked. She has never used smokeless tobacco. She reports that she drinks alcohol. She reports that she does not use illicit drugs.  Review of Systems:  She is now on HCTZ presumably for hypertension  Lipids:  She in on Crestor from PCP. She thinks she has had joint pains from Crestor (and also Lipitor).  Her last LDL was 222 in October 2016, presumably not taking her medication at that time Has not followed up for this with PCP recently  Apparently baseline LDL had been over 200    Lab Results  Component Value Date   CHOL 229* 04/01/2014   HDL 69.50 04/01/2014   LDLCALC 142* 04/01/2014   LDLDIRECT 120.1 09/30/2013   TRIG 89.0 04/01/2014   CHOLHDL 3 04/01/2014    Foot exam done in 3/17, normal exam    Examination:  No significant ankle edema  Diabetic Foot Exam - Simple   No data filed        BP 124/72 mmHg  Pulse 71  Temp(Src) 98.1 F (36.7 C)  Resp 14  Ht 5\' 2"  (1.575 m)  Wt 187 lb 6.4 oz (85.004 kg)  BMI 34.27 kg/m2  SpO2 96%  Body mass index is  34.27 kg/(m^2).    ASSESSMENT/ PLAN:  Diabetes type 2   Blood glucose control is overall Relatively worse with A1c going up to 7.4, previously fairly good at 6.5 She also is gaining weight with Trulicity A999333 mg despite generally watching her diet and walking on treadmill She has had nausea from 1.5 Trulicity previously She thinks her weight and is only in the last 2-3 weeks since her breast surgery and does not know the reason  Although she may be getting some progression of her diabetes she probably has more insulin resistance related to her weight gain She is a good candidate for a drug like Invokana Discussed action of SGLT 2 drugs on lowering glucose by decreasing kidney absorption of glucose, benefits of weight loss and lower blood pressure, possible side effects including candidiasis and dosage regimen   She will start with 100 mg Invokana in the morning and discussed needing to monitor renal function and electrolytes with this Will stop HCTZ in the meantime  Continue regular exercise Discussed general foot care  Follow-up in 6 weeks  Hypercholesterolemia:  She will follow-up with PCP, needs aggressive management  Counseling time on subjects discussed above is over 50% of today's 25 minute visit    Glada Wickstrom 05/04/2015,  4:07 PM

## 2015-05-04 NOTE — Patient Instructions (Signed)
Invokana in am   Stop HCTZ

## 2015-05-14 ENCOUNTER — Ambulatory Visit (HOSPITAL_BASED_OUTPATIENT_CLINIC_OR_DEPARTMENT_OTHER): Payer: Medicare Other | Admitting: Oncology

## 2015-05-14 VITALS — BP 147/64 | HR 83 | Temp 97.6°F | Resp 18 | Ht 62.0 in | Wt 186.7 lb

## 2015-05-14 DIAGNOSIS — C50411 Malignant neoplasm of upper-outer quadrant of right female breast: Secondary | ICD-10-CM

## 2015-05-14 DIAGNOSIS — Z170 Estrogen receptor positive status [ER+]: Secondary | ICD-10-CM | POA: Diagnosis not present

## 2015-05-14 DIAGNOSIS — C50911 Malignant neoplasm of unspecified site of right female breast: Secondary | ICD-10-CM

## 2015-05-14 NOTE — Progress Notes (Signed)
Amanda [name]  Telephone:([phone]) [phone] Fax:(336) [phone]     ID: Amanda [name] DOB: 1945-12-26  MR#: [id number]  NAT#:[id number]  Patient Care Team: Antony Contras, MD as PCP - General (Family Medicine) Sylvan Cheese, NP as Nurse Practitioner (Hematology and Oncology) Chauncey Cruel, MD as Consulting Physician (Oncology) Thea Silversmith, MD as Consulting Physician (Radiation Oncology) Alphonsa Overall, MD as Consulting Physician (General Surgery) [name], MD as Consulting Physician (Endocrinology) PCP: Gara Kroner, MD GYN: OTHER MD:  CHIEF COMPLAINT: estrogen receptor positive breast cancer  CURRENT TREATMENT: awaiting adjuvant radiation   BREAST CANCER HISTORY:  from the original intake note:  Amanda [name] had routine screening mammography at [facility] 03/10/2015. The breast density was category B. There was a new cluster of nodules in the right breast at the 11:00 position measuring approximately 5 mm. Ultrasonography 03/15/2014 confirmed a 1.1 center meter oval mass in the right breast at the 10:00 position 6 cm from the nipple. There was a benign 0.8 cm simple cyst immediately adjacent to the mass. The right axilla was sonographically benign.  Biopsy of the right breast mass in question 03/22/2015 showed ([id number]) invasive ductal carcinoma, grade 1, estrogen receptor 95% positive, progesterone receptor 80% positive, both with strong staining intensity, with an MIB-1 of 15%, and no HER-2 amplification, the signals ratio being 1.58 and the number per cell 3.00.  Her subsequent history is as detailed below  INTERVAL HISTORY: [place] today for follow-up of her  Estrogen receptor positive breast cancer. Since her last visit here she had a right lumpectomy and sentinel lymph node sampling, on 04/16/2015. The final pathology [id number]) showed an invasive ductal carcinoma measuring 1.3 cm, grade 1, with negative margins. The single sentinel lymph node was  clear. Repeat HER-2 was again negative, with a signals ratio of 1.64, the number per cell being 2.05.  We obtained an Oncotype from this material which showed a score of 12, predicting a risk of outside the breast recurrence within 10 years of 8% if the patient's only systemic therapy is tamoxifen for 5 years  REVIEW OF SYSTEMS:  She did well with the surgery, with  Some pain, for which she is taking Tylenol, but no bleeding, or fever. However on 05/11/2015 while showering she felt a change and she tells me the wound has "opened up" a bit.  She has an appointment with Dr. Lucia Gaskins later today to evaluate that further.  Detailed review of systems today was otherwise noncontributory  PAST MEDICAL HISTORY: Past Medical History  Diagnosis Date  . Diabetes mellitus without complication ([place])   . GERD (gastroesophageal reflux disease)     Pt takes OTC when needed.  . Arthritis   . Carpal tunnel syndrome   . Breast cancer of upper-outer quadrant of right female breast ([place]) 03/24/2015  . Hot flashes     PAST SURGICAL HISTORY: Past Surgical History  Procedure Laterality Date  . Shoulder surgery Right   . Appendectomy    . Abdominal hysterectomy    . Tonsillectomy    . Eye surgery Bilateral     Cataracts removed  . Colonoscopy    . Esophagogastroduodenoscopy    . 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 Total knee arthroplasty Right 07/18/2013    Procedure: TOTAL KNEE ARTHROPLASTY;  Surgeon: Alta Corning, MD;  Location: [place];  Service: Orthopedics;  Laterality: Right;  . Joint replacement    . Radioactive seed guided mastectomy with axillary  sentinel lymph node biopsy Right 04/16/2015    Procedure: RADIOACTIVE SEED GUIDED PARTIAL MASTECTOMY WITH AXILLARY SENTINEL LYMPH NODE BIOPSY;  Surgeon: Alphonsa Overall, MD;  Location: [place];  Service: General;  Laterality: Right;    FAMILY HISTORY No family history on file. The patient's father died in his 74s from  reasons unclear to the patient. Her mother died from heart disease at the age of 75. The patient had one brother, 5 sisters. There is no history of breast or ovarian cancer in the family to her knowledge.  GYNECOLOGIC HISTORY:  No LMP recorded. Patient has had a hysterectomy. Menarche age 32, first live birth age 24. She is GX P2. She underwent hysterectomy with bilateral salpingo-oophorectomy in her 47s. 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 She is divorced and lives alone, with no pets. Son Amanda [name] lives in [place] where he manages an auto zone. Daughter Amanda [name] lives in [place]. She works for [employer]. The patient has 4 grandchildren. She is a Psychologist, forensic.    ADVANCED DIRECTIVES: Not in place   HEALTH MAINTENANCE: Social History  Substance Use Topics  . Smoking status: Never Smoker   . Smokeless tobacco: Never Used  . Alcohol Use: Yes     Comment: socially, occ weekends     Col[facility]oscopy: 2016[facility]  PAP: Status post hysterectomy  Bone density: 2016/ normal/ [facility]  Lipid panel:  Allergies  Allergen Reactions  . Meperidine Hcl     REACTION: rash  . Statins Other (See Comments)    Muscle pain. Pt able to take Crestor but not everyday.    Current Outpatient Prescriptions  Medication Sig Dispense Refill  . aspirin 81 MG tablet Take 81 mg by mouth daily.    . canagliflozin (INVOKANA) 100 MG TABS tablet 1 tablet before breakfast 30 tablet 3  . fexofenadine (ALLEGRA) 180 MG tablet Take 180 mg by mouth daily.    Marland Kitchen HYDROcodone-acetaminophen (NORCO/VICODIN) 5-325 MG tablet Take 1-2 tablets by mouth every 6 (six) hours as needed. 30 tablet 0  . montelukast (SINGULAIR) 10 MG tablet Take 10 mg by mouth at bedtime.    . ONE TOUCH ULTRA TEST test strip USE TO TEST BLOOD SUGAR ONE TIME DAILY AS INSTRUCTED 100 each 1  . ranitidine (ZANTAC) 150 MG tablet Take 150 mg by mouth 2  (two) times daily.    . rosuvastatin (CRESTOR) 10 MG tablet Take 10 mg by mouth daily.    . TRULICITY 5.10 CH/8.5ID SOPN INJECT 0.75 MG UNDER THE SKIN ONCE A WEEK 6 mL 2  . Vitamin D, Ergocalciferol, (DRISDOL) 50000 UNITS CAPS capsule Take 50,000 Units by mouth every 7 (seven) days.     No current facility-administered medications for this visit.    OBJECTIVE: Middle-aged African-American woman who appears stated age 70 Vitals:   05/14/15 1330  BP: 147/64  Pulse: 83  Temp: 97.6 F (36.4 C)  Resp: 18     Body mass index is 34.14 kg/(m^2).    ECOG FS:1 - Symptomatic but completely ambulatory  Sclerae unicteric, pupils round and equal Oropharynx clear and moist-- no thrush or other lesions No cervical or supraclavicular adenopathy Lungs no rales or rhonchi Heart regular rate and rhythm Abd soft, obese, nontender, positive bowel sounds MSK no focal spinal tenderness, no upper extremity lymphedema Neuro: nonfocal, well oriented, appropriate affect Breasts:  The right breast is status post recent  lumpectomy and axillary lymph node sampling. The cosmetic result is excellent. The breast incision shows a mild area in the medial aspect of the dehiscence, which is pictured below. The left axilla is benign. The left breast is unremarkable.    Photo 05/14/2015, right breast      LAB RESULTS:  CMP     Component Value Date/Time   NA 140 04/29/2015 1010   NA 143 03/31/2015 0831   K 4.4 04/29/2015 1010   K 4.0 03/31/2015 0831   CL 103 04/29/2015 1010   CO2 30 04/29/2015 1010   CO2 27 03/31/2015 0831   GLUCOSE 116* 04/29/2015 1010   GLUCOSE 149* 03/31/2015 0831   BUN 18 04/29/2015 1010   BUN 17.6 03/31/2015 0831   CREATININE 0.82 04/29/2015 1010   CREATININE 0.9 03/31/2015 0831   CALCIUM 10.2 04/29/2015 1010   CALCIUM 10.2 03/31/2015 0831   PROT 7.4 04/29/2015 1010   PROT 7.8 03/31/2015 0831   ALBUMIN 4.1 04/29/2015 1010   ALBUMIN 3.9 03/31/2015 0831   AST 20 04/29/2015  1010   AST 20 03/31/2015 0831   ALT 21 04/29/2015 1010   ALT 17 03/31/2015 0831   ALKPHOS 50 04/29/2015 1010   ALKPHOS 55 03/31/2015 0831   BILITOT 0.3 04/29/2015 1010   BILITOT 0.53 03/31/2015 0831   GFRNONAA 71* 07/19/2013 0405   GFRAA 82* 07/19/2013 0405    INo results found for: SPEP, UPEP  Lab Results  Component Value Date   WBC 5.6 03/31/2015   NEUTROABS 1.1* 03/31/2015   HGB 13.9 03/31/2015   HCT 41.8 03/31/2015   MCV 93.9 03/31/2015   PLT 268 03/31/2015      Chemistry      Component Value Date/Time   NA 140 04/29/2015 1010   NA 143 03/31/2015 0831   K 4.4 04/29/2015 1010   K 4.0 03/31/2015 0831   CL 103 04/29/2015 1010   CO2 30 04/29/2015 1010   CO2 27 03/31/2015 0831   BUN 18 04/29/2015 1010   BUN 17.6 03/31/2015 0831   CREATININE 0.82 04/29/2015 1010   CREATININE 0.9 03/31/2015 0831      Component Value Date/Time   CALCIUM 10.2 04/29/2015 1010   CALCIUM 10.2 03/31/2015 0831   ALKPHOS 50 04/29/2015 1010   ALKPHOS 55 03/31/2015 0831   AST 20 04/29/2015 1010   AST 20 03/31/2015 0831   ALT 21 04/29/2015 1010   ALT 17 03/31/2015 0831   BILITOT 0.3 04/29/2015 1010   BILITOT 0.53 03/31/2015 0831       No results found for: LABCA2  No components found for: LABCA125  No results for input(s): INR in the last 168 hours.  Urinalysis    Component Value Date/Time   COLORURINE YELLOW 04/29/2015 1010   APPEARANCEUR Sl Cloudy* 04/29/2015 1010   LABSPEC 1.020 04/29/2015 1010   PHURINE 5.5 04/29/2015 1010   GLUCOSEU NEGATIVE 04/29/2015 1010   GLUCOSEU NEGATIVE 07/10/2013 1548   HGBUR NEGATIVE 04/29/2015 1010   BILIRUBINUR NEGATIVE 04/29/2015 1010   KETONESUR NEGATIVE 04/29/2015 1010   PROTEINUR NEGATIVE 07/10/2013 1548   UROBILINOGEN 0.2 04/29/2015 1010   NITRITE NEGATIVE 04/29/2015 1010   LEUKOCYTESUR NEGATIVE 04/29/2015 1010      ELIGIBLE FOR AVAILABLE RESEARCH PROTOCOL: no  STUDIES: Nm Sentinel Node Inj-no Rpt (breast)  04/16/2015   CLINICAL DATA: right breast cancer Sulfur colloid was injected intradermally by the nuclear medicine technologist for breast cancer sentinel node localization.    ASSESSMENT: 70 y.o. [place] woman status post  right breast biopsy 03/22/2015 for a clinical T1CN0, stage IA invasive ductal carcinoma, grade 1, estrogen and progesterone receptor positive, HER-2 not amplified, with an MIB-1 of 15%.  (1)  Status post right lumpectomy and sentinel lymph node sampling 04/16/2015 for a pT1c pN0, stage IA  Invasive ductal carcinoma, grade 1, repeat HER-2 again negative  (2) Oncotype DX  Score of 12 predicts a risk of outside the breast recurrence of 8% within 10 years if the patient's only systemic therapy is tamoxifen for 5 years. It also predicts no benefit from chemotherapy.  (3) adjuvant radiation to follow  (4) anti-estrogens to follow the completion of local treatment  PLAN:  Rei did well with her surgery and I reassured her that the small area of dehiscence is not a major problem. She is seeing Dr. Lucia Gaskins today and he may simply want her to use wet-to-dry or perhaps add a couple of Steri-Strips.  With regards to systemic therapy we discussed her Oncotype results at length. She understands that the patients in this study all took tamoxifen for 5 years because it was a only option available at that time. Half the patients, randomly assigned, also received chemotherapy.  The patient's that she most resembles  Received no benefit from chemotherapy. He had an excellent prognosis with tamoxifen alone. In fact she has a 92% chance of this cancer not coming back outside the breast in the next 10 years if all she does is take tamoxifen for 5 years.  We can do a little bit better with the aromatase inhibitors and we will discuss that at her next visit here, but her next step is radiation. She really has an appointment with radiation oncology next week. She is can return to see me sometime in the second  half of June. At that time I expect we will start anastrozole  She has a good understanding of the overall plan. She agrees with it. She will call with any problems that may develop before the next visit.     :Chauncey Cruel, MD   05/14/2015 1:58 PM Medical Oncology and Hematology [facility] [place], [place] [address] Tel. [phone]    Fax. [phone]

## 2015-05-17 ENCOUNTER — Encounter: Payer: Self-pay | Admitting: Radiation Oncology

## 2015-05-17 ENCOUNTER — Telehealth: Payer: Self-pay | Admitting: Oncology

## 2015-05-17 NOTE — Progress Notes (Signed)
Location of Breast Cancer:Upper-outer quadrant right female breast  Histology per Pathology Report: Diagnosis  04-16-15 1. Breast, lumpectomy, right - INVASIVE GRADE I DUCTAL CARCINOMA WITH PAPILLARY FEATURES AND ASSOCIATED EXTRACELLULAR MUCIN. - ASSOCIATED LOW GRADE DUCTAL CARCINOMA IN SITU AND CALCIFICATIONS. - TUMOR MEASURES 1.3 CM IN GREATEST DIMENSION. - MARGINS ARE NEGATIVE (PLEASE SEE SPECIMENS # 2 AND # 4 FOR FINAL MARGIN STATUS). - SEE ONCOLOGY TEMPLATE. 2. Breast, excision, right new posterior margin - BENIGN BREAST PARENCHYMA WITH FIBROCYSTIC CHANGES. - NO ATYPIA OR TUMOR SEEN. 3. Breast, excision, right new superior margin - BENIGN BREAST PARENCHYMA WITH FIBROCYSTIC CHANGES. 1 of 4 Diagnosis(continued) - NO ATYPIA OR TUMOR SEEN. 4. Lymph node, sentinel, biopsy, right axillary #1 - ONE BENIGN LYMPH NODE WITH NO TUMOR SEEN Receptor Status: ER(95%), PR (80%), Her2-neu (-), Ki-(15%)  Did patient present with symptoms (if so, please note symptoms) or was this found on screening mammography?: Mrs. Cordell had a routine mammogram screening 03-10-15.  Past/Anticipated interventions by surgeon, if any:05-14-15 Assessement and treatment of dehiscence area right breast and post op check Follow up visit in 3 weeks for right breast wound check  Past/Anticipated interventions by medical oncology, if PHX:[id number] results, adjuvant radiation to follow with anti-estrogens after completion of local treatment start Anastrozole in end of June Lymphedema issues, if any:   Pain issues, if any:  No Arm movement:Moving right arm without discomfort SAFETY ISSUES:  Prior radiation? :No  Pacemaker/ICD?:No  Possible current pregnancy?: No  Is the patient on methotrexate? No Menarche age 19, G69,P2, Menopause80, BC  no, HRT  20 years Current Complaints / other details: Here to discuss radiation treatment plan BP 124/51 mmHg  Pulse 65  Temp(Src) 97.6 F (36.4 C) (Oral)  Resp 16  Ht '5\' 2"'  (1.575  m)  Wt 184 lb (83.462 kg)  BMI 33.65 kg/m2  SpO2 100%    Georgena Spurling, RN 05/17/2015,6:35 PM

## 2015-05-17 NOTE — Telephone Encounter (Signed)
appt made per 4/7 pof. Spoke with patient she is aware of date/time

## 2015-05-20 ENCOUNTER — Ambulatory Visit
Admission: RE | Admit: 2015-05-20 | Discharge: 2015-05-20 | Disposition: A | Discharge status: Home / Self Care | Payer: Medicare Other | Source: Ambulatory Visit | Attending: Radiation Oncology | Admitting: Radiation Oncology

## 2015-05-20 ENCOUNTER — Encounter: Payer: Self-pay | Admitting: Radiation Oncology

## 2015-05-20 VITALS — BP 124/51 | HR 65 | Temp 97.6°F | Resp 16 | Ht 62.0 in | Wt 184.0 lb

## 2015-05-20 DIAGNOSIS — Z9889 Other specified postprocedural states: Secondary | ICD-10-CM | POA: Diagnosis not present

## 2015-05-20 DIAGNOSIS — Z170 Estrogen receptor positive status [ER+]: Secondary | ICD-10-CM | POA: Diagnosis not present

## 2015-05-20 DIAGNOSIS — Z510 Encounter for antineoplastic radiation therapy: Secondary | ICD-10-CM | POA: Diagnosis present

## 2015-05-20 DIAGNOSIS — C50411 Malignant neoplasm of upper-outer quadrant of right female breast: Secondary | ICD-10-CM | POA: Diagnosis present

## 2015-05-20 HISTORY — DX: Malignant neoplasm of unspecified site of unspecified female breast: C50.919

## 2015-05-20 NOTE — Progress Notes (Signed)
   Department of Radiation Oncology  Phone:  [phone] Fax:        [phone]   Name: Amanda [name] MRN: [id number]  DOB: 1945/12/31  Date: 05/20/2015  Follow Up Visit Note  Diagnosis: Breast cancer of upper-outer quadrant of right female breast [facility])   Staging form: Breast, AJCC 7th Edition     Clinical stage from 03/31/2015: Stage IA (T1c, N0, M0) - Unsigned  Interval History: Amanda [name] presents today for routine followup.  I last saw her for initial consultation 03/31/2015. She had a right breast lumpectomy 04/16/2015, revealing grade I invasive ductal carcinoma and associated low grade DCIS and the tumor measuring 1.3 cm and negative margins. She had 0/1 positive lymph nodes. The tumor is ER (95%), PR (80%), Her2-neu negative, and Ki 67 (15%).She had Oncotype testing performed and it revealed that she will not need chemotherapy  She mentions she had some pain after surgery but is doing well. She mentions that she does not think her incision is healing well. She denies any pain.  Physical Exam:  Filed Vitals:   05/20/15 0827  BP: 124/51  Pulse: 65  Temp: 97.6 F (36.4 C)  TempSrc: Oral  Resp: 16  Height: '5\' 2"'$  (1.575 m)  Weight: 184 lb (83.462 kg)  SpO2: 100%  She is able to move her right arm without discomfort. Her incision is healing well except for an approximately 5 mm area on the medial aspect of the scar which is superficially broken down. There is good granulation tissue and it is healing well.  IMPRESSION: Amanda [name] is a 70 y.o. female with Stage IA Breast cancer of the upper-outer quadrant of the right female breast.  PLAN:  I spoke to the patient today regarding her diagnosis and options for treatment. We discussed the equivalence in terms of survival and local failure between mastectomy and breast conservation. We discussed the role of radiation in decreasing local failures in patients who undergo lumpectomy. We discussed the process of simulation and the placement  tattoos. We discussed 5 weeks of treatment as an outpatient. We discussed the possibility of asymptomatic lung damage. We discussed the low likelihood of secondary malignancies. We discussed the possible side effects including but not limited to skin redness, fatigue, permanent skin darkening, and breast swelling. This procedure has been fully reviewed with the patient and written informed consent has been obtained.  Her planning appointment is scheduled 06/01/2015 at 9 am and she will start treatment the following week if her lumpectomy scar is healed well.  Following radiation, she will start anti-estrogen therapy and will start Anastrozole at the end of June. She has an appointment with Dr. Jana Hakim 07/26/2015.  ------------------------------------------------  Amanda Silversmith, MD    This document serves as a record of services personally performed by Amanda Silversmith, MD. It was created on her behalf by  Lendon Collar, a trained medical scribe. The creation of this record is based on the scribe's personal observations and the provider's statements to them. This document has been checked and approved by the attending provider.

## 2015-05-20 NOTE — Addendum Note (Signed)
Encounter addended by: Malena Edman, RN on: 05/20/2015 11:57 AM<BR>     Documentation filed: Charges VN

## 2015-06-01 ENCOUNTER — Ambulatory Visit: Payer: Medicare Other | Admitting: Radiation Oncology

## 2015-06-03 ENCOUNTER — Ambulatory Visit
Admission: RE | Admit: 2015-06-03 | Discharge: 2015-06-03 | Disposition: A | Discharge status: Home / Self Care | Payer: Medicare Other | Source: Ambulatory Visit | Attending: Radiation Oncology | Admitting: Radiation Oncology

## 2015-06-03 DIAGNOSIS — C50411 Malignant neoplasm of upper-outer quadrant of right female breast: Secondary | ICD-10-CM

## 2015-06-03 DIAGNOSIS — Z510 Encounter for antineoplastic radiation therapy: Secondary | ICD-10-CM | POA: Diagnosis not present

## 2015-06-03 NOTE — Progress Notes (Signed)
Radiation Oncology         [phone] ________________________________  Name: Amanda [name]      MRN: [id number]          Date: 06/03/2015              DOB: 09-12-1945  Optical Surface Tracking Plan:  Since intensity modulated radiotherapy (IMRT) and 3D conformal radiation treatment methods are predicated on accurate and precise positioning for treatment, intrafraction motion monitoring is medically necessary to ensure accurate and safe treatment delivery.  The ability to quantify intrafraction motion without excessive ionizing radiation dose can only be performed with optical surface tracking. Accordingly, surface imaging offers the opportunity to obtain 3D measurements of patient position throughout IMRT and 3D treatments without excessive radiation exposure.  I am ordering optical surface tracking for this patient's upcoming course of radiotherapy. ________________________________ Signature   Reference:   Ursula Alert, J, et al. Surface imaging-based analysis of intrafraction motion for breast radiotherapy patients.Journal of [employer], n. 6, nov. 2014. ISSN [id number].   Available at: <http://www.jacmp.org/index.php/jacmp/article/view/4957>.   ------------------------------------------------  Thea Silversmith, MD

## 2015-06-03 NOTE — Progress Notes (Signed)
Name: Amanda [name]   MRN: [id number]  Date:  06/03/2015  DOB: 11-Jul-1945  Status:outpatient    DIAGNOSIS: Breast cancer.  CONSENT VERIFIED: yes   SET UP: Patient is setup supine   IMMOBILIZATION:  The following immobilization was used:Custom Moldable Pillow, breast board.   NARRATIVE: [name] was brought to the [facility].  Identity was confirmed.  All relevant records and images related to the planned course of therapy were reviewed.  Then, the patient was positioned in a stable reproducible clinical set-up for radiation therapy.  Wires were placed to delineate the clinical extent of breast tissue. A wire was placed on the scar as well.  CT images were obtained.  An isocenter was placed. Skin markings were placed.  The CT images were loaded into the planning software where the target and avoidance structures were contoured.  The radiation prescription was entered and confirmed. The patient was discharged in stable condition and tolerated simulation well.    TREATMENT PLANNING NOTE:  Treatment planning then occurred. I have requested : MLC's, isodose plan, basic dose calculation  I personally designed and supervised the construction of 3 medically necessary complex treatment devices for the protection of critical normal structures including the lungs and contralateral breast as well as the immobilization device which is necessary for set up certainty.   3D simulation occurred. I requested and analyzed a dose volume histogram of the heart, lungs and lumpectomy cavity.   ------------------------------------------------  Thea Silversmith, MD

## 2015-06-10 DIAGNOSIS — Z510 Encounter for antineoplastic radiation therapy: Secondary | ICD-10-CM | POA: Diagnosis not present

## 2015-06-11 ENCOUNTER — Ambulatory Visit
Admission: RE | Admit: 2015-06-11 | Discharge: 2015-06-11 | Disposition: A | Discharge status: Home / Self Care | Payer: Medicare Other | Source: Ambulatory Visit | Attending: Radiation Oncology | Admitting: Radiation Oncology

## 2015-06-11 DIAGNOSIS — C50411 Malignant neoplasm of upper-outer quadrant of right female breast: Secondary | ICD-10-CM

## 2015-06-11 DIAGNOSIS — Z510 Encounter for antineoplastic radiation therapy: Secondary | ICD-10-CM | POA: Diagnosis not present

## 2015-06-11 MED ORDER — RADIAPLEXRX EX GEL: Freq: Once | CUTANEOUS | Status: DC

## 2015-06-11 MED ORDER — ALRA NON-METALLIC DEODORANT (RAD-ONC): 1.0000 "application " | Freq: Once | TOPICAL | Status: DC

## 2015-06-14 ENCOUNTER — Ambulatory Visit
Admission: RE | Admit: 2015-06-14 | Discharge: 2015-06-14 | Disposition: A | Discharge status: Home / Self Care | Payer: Medicare Other | Source: Ambulatory Visit | Attending: Radiation Oncology | Admitting: Radiation Oncology

## 2015-06-14 ENCOUNTER — Other Ambulatory Visit (INDEPENDENT_AMBULATORY_CARE_PROVIDER_SITE_OTHER): Payer: Medicare Other

## 2015-06-14 DIAGNOSIS — Z510 Encounter for antineoplastic radiation therapy: Secondary | ICD-10-CM | POA: Diagnosis not present

## 2015-06-14 DIAGNOSIS — E1165 Type 2 diabetes mellitus with hyperglycemia: Secondary | ICD-10-CM | POA: Diagnosis not present

## 2015-06-14 LAB — BASIC METABOLIC PANEL
BUN: 22 mg/dL (ref 6–23)
CALCIUM: 10.4 mg/dL (ref 8.4–10.5)
CO2: 28 meq/L (ref 19–32)
Chloride: 103 mEq/L (ref 96–112)
Creatinine, Ser: 0.88 mg/dL (ref 0.40–1.20)
GFR: 81.7 mL/min (ref >60.00)
GLUCOSE: 119 mg/dL — AB (ref 70–99)
Potassium: 4.1 mEq/L (ref 3.5–5.1)
SODIUM: 140 meq/L (ref 135–145)

## 2015-06-14 LAB — LIPID PANEL
CHOL/HDL RATIO: 4
Cholesterol: 280 mg/dL — ABNORMAL HIGH (ref 0–200)
HDL: 67.4 mg/dL (ref >39.00)
LDL CALC: 193 mg/dL — AB (ref 0–99)
NONHDL: 212.75
Triglycerides: 99 mg/dL (ref 0.0–149.0)
VLDL: 19.8 mg/dL (ref 0.0–40.0)

## 2015-06-15 ENCOUNTER — Ambulatory Visit: Admission: RE | Admit: 2015-06-15 | Payer: Medicare Other | Source: Ambulatory Visit | Admitting: Radiation Oncology

## 2015-06-15 ENCOUNTER — Encounter: Payer: Self-pay | Admitting: Radiation Oncology

## 2015-06-15 ENCOUNTER — Encounter: Payer: Self-pay | Admitting: General Practice

## 2015-06-15 ENCOUNTER — Ambulatory Visit
Admission: RE | Admit: 2015-06-15 | Discharge: 2015-06-15 | Disposition: A | Discharge status: Home / Self Care | Payer: Medicare Other | Source: Ambulatory Visit | Attending: Radiation Oncology | Admitting: Radiation Oncology

## 2015-06-15 VITALS — BP 100/87 | HR 72 | Temp 97.8°F | Ht 62.0 in | Wt 187.7 lb

## 2015-06-15 DIAGNOSIS — C50411 Malignant neoplasm of upper-outer quadrant of right female breast: Secondary | ICD-10-CM

## 2015-06-15 DIAGNOSIS — Z510 Encounter for antineoplastic radiation therapy: Secondary | ICD-10-CM | POA: Diagnosis not present

## 2015-06-15 LAB — FRUCTOSAMINE: FRUCTOSAMINE: 251 umol/L (ref 0–285)

## 2015-06-15 MED ORDER — RADIAPLEXRX EX GEL
Freq: Once | CUTANEOUS | Status: AC
Start: 2015-06-15 — End: 2015-06-11
  Administered 2015-06-11: 15:00:00 via TOPICAL

## 2015-06-15 MED ORDER — ALRA NON-METALLIC DEODORANT (RAD-ONC)
1.0000 "application " | Freq: Once | TOPICAL | Status: AC
  Administered 2015-06-11: 1 via TOPICAL

## 2015-06-15 NOTE — Progress Notes (Signed)
Amanda [name] presents for her 2nd fraction of radiation to her Right Breast. She reports some mild fatigue, and states she has had some insomnia for the past few nights. She has had no skin changes to her breast. She has not used the Radiaplex yet, and I encouraged her to begin using it twice daily. She also mentions having an interest in speaking to a counselor about anxiety related to her medical situation.  BP 100/87 mmHg  Pulse 72  Temp(Src) 97.8 F (36.6 C)  Ht 5\' 2"  (1.575 m)  Wt 187 lb 11.2 oz (85.14 kg)  BMI 34.32 kg/m2  SpO2 100%

## 2015-06-15 NOTE — Progress Notes (Signed)
Weekly Management Note Current Dose:  4 Gy  Projected Dose:  50 Gy   Narrative:  The patient presents for routine under treatment assessment.  CBCT/MVCT images/Port film x-rays were reviewed.  The chart was checked. Ms. Defino presents for her 2nd fraction of radiation to her Right Breast. She reports some mild fatigue, and states she has had some insomnia for the past few nights. She has had no skin changes to her breast. She has not used the Radiaplex yet, and I encouraged her to begin using it twice daily. She also mentions having an interest in speaking to a counselor about anxiety related to her medical situation.  Physical Findings: Weight: 187 lb 11.2 oz (85.14 kg). Unchanged  Impression:  The patient is tolerating radiation.  Plan:  Continue treatment as planned. Encouraged to start using Radiaplex. Will ask SW and chaplain to follow up.     ------------------------------------------------  Thea Silversmith, MD    This document serves as a record of services personally performed by Thea Silversmith, MD. It was created on her behalf by  Lendon Collar, a trained medical scribe. The creation of this record is based on the scribe's personal observations and the provider's statements to them. This document has been checked and approved by the attending provider.

## 2015-06-16 ENCOUNTER — Ambulatory Visit (INDEPENDENT_AMBULATORY_CARE_PROVIDER_SITE_OTHER): Payer: Medicare Other | Admitting: Endocrinology

## 2015-06-16 ENCOUNTER — Ambulatory Visit
Admission: RE | Admit: 2015-06-16 | Discharge: 2015-06-16 | Disposition: A | Discharge status: Home / Self Care | Payer: Medicare Other | Source: Ambulatory Visit | Attending: Radiation Oncology | Admitting: Radiation Oncology

## 2015-06-16 ENCOUNTER — Encounter: Payer: Self-pay | Admitting: Endocrinology

## 2015-06-16 VITALS — BP 128/62 | HR 70 | Temp 99.0°F | Resp 14 | Ht 62.0 in | Wt 184.8 lb

## 2015-06-16 DIAGNOSIS — Z510 Encounter for antineoplastic radiation therapy: Secondary | ICD-10-CM | POA: Diagnosis not present

## 2015-06-16 DIAGNOSIS — E1165 Type 2 diabetes mellitus with hyperglycemia: Secondary | ICD-10-CM

## 2015-06-16 DIAGNOSIS — E785 Hyperlipidemia, unspecified: Secondary | ICD-10-CM | POA: Diagnosis not present

## 2015-06-16 MED ORDER — PITAVASTATIN CALCIUM 2 MG PO TABS: ORAL_TABLET | ORAL | Status: DC

## 2015-06-16 NOTE — Progress Notes (Signed)
Spiritual Care Note  Referred by Dr Pablo Ledger for emotional support, as pt was tearful 3/3 visits.  Becci welcomed pastoral presence, verbalizing appreciation throughout encounter.  Per pt, she has distress related to dx, tx, social isolation (moved to [facility] from [place]), and recent hiatus in Medicare coverage.  (Dr Pablo Ledger referred to [facility] advocate for assistance.  I consulted with Abby Potash Hock/LCSW.)  That yesterday was Malala's birthday exacerbated her distress, though she reported looking forward to sharing a meal with her daughter to honor the occasion.  We plan to f/u by phone to schedule 1:1 support appointment.  Also plan to send handwritten note of encouragement.  Please page if needs arise/circumstances change.  Thank you.  [place], [place], [facility] Pager [phone] Voicemail [phone]

## 2015-06-16 NOTE — Progress Notes (Signed)
Amanda [name] is an 70 y.o. female.             Reason for Appointment: Diabetes follow-up   History of Present Illness   Diagnosis: Type 2 DIABETES MELITUS     Previous history: She has had difficulty with blood sugar control initially because of limited tolerability with metformin and difficulty with weight loss. She did very well with starting Victoza which has helped her blood sugar control as well as some improvement in her obesity Her insurance company did not prefer Victoza and she was switched to Trulicity Subsequently her glipizide was stopped  Recent history:     Her blood sugars were overall higher on her last visit with increased A1c of 7.4, previously 6.5 She was started on Invokana 100 mg daily  Current blood sugar patterns, management and problems identified:  Has tried the Invokana that was prescribed for 30 days but did not get it refilled last week  She did check her blood sugars at home regularly regularly but not daily and only in the mornings  Overall blood sugars are averaging lower in the mornings  She has no side effects from [place] and appears to have lost 3 pounds  Also been taking Trulicity [id number] weekly with no side effects except very mild transient nausea  She has not been able to exercise as regularly because of her other medical issues  She thinks she is still trying to watch her diet     Oral hypoglycemic drugs:   Invokana 100 mg daily       Side effects from medications: Diarrhea from metformin.  Nausea from 1.5 mg Trulicity         Monitors blood glucose:  less than Once a day.    Glucometer: One Touch ultra 2             Blood Glucose readings  Mean values apply above for all meters except median for One Touch  PRE-MEAL Fasting Lunch Dinner Bedtime Overall  Glucose range: 99-135       Mean/median: 119     119           Physical activity: exercise: walking on treadmill 2-4/7 days upto 60 min            Wt Readings from Last 3  Encounters:  06/16/15 184 lb 12.8 oz (83.825 kg)  06/15/15 187 lb 11.2 oz (85.14 kg)  05/20/15 184 lb (83.462 kg)    LABS:  Lab Results  Component Value Date   HGBA1C 7.4* 04/29/2015   HGBA1C 6.5 11/02/2014   HGBA1C 6.6* 04/01/2014   Lab Results  Component Value Date   MICROALBUR <0.7 04/01/2014   LDLCALC 193* 06/14/2015   CREATININE 0.88 06/14/2015     Lab on 06/14/2015  Component Date Value Ref Range Status  . Sodium 06/14/2015 140  135 - 145 mEq/L Final  . Potassium 06/14/2015 4.1  3.5 - 5.1 mEq/L Final  . Chloride 06/14/2015 103  96 - 112 mEq/L Final  . CO2 06/14/2015 28  19 - 32 mEq/L Final  . Glucose, Bld 06/14/2015 119* 70 - 99 mg/dL Final  . BUN 06/14/2015 22  6 - 23 mg/dL Final  . Creatinine, Ser 06/14/2015 0.88  0.40 - 1.20 mg/dL Final  . Calcium 06/14/2015 10.4  8.4 - 10.5 mg/dL Final  . GFR 06/14/2015 81.70  >60.00 mL/min Final  . Fructosamine 06/14/2015 251  0 - 285 umol/L Final   Comment: Published reference interval for  apparently healthy subjects between age 62 and 77 is 74 - 285 umol/L and in a poorly controlled diabetic population is 228 - 563 umol/L with a mean of 396 umol/L.   Marland Kitchen Cholesterol 06/14/2015 280* 0 - 200 mg/dL Final   ATP III Classification       Desirable:  < 200 mg/dL               Borderline High:  200 - 239 mg/dL          High:  > = 240 mg/dL  . Triglycerides 06/14/2015 99.0  0.0 - 149.0 mg/dL Final   Normal:  <150 mg/dLBorderline High:  150 - 199 mg/dL  . HDL 06/14/2015 67.40  >39.00 mg/dL Final  . VLDL 06/14/2015 19.8  0.0 - 40.0 mg/dL Final  . LDL Cholesterol 06/14/2015 193* 0 - 99 mg/dL Final  . Total CHOL/HDL Ratio 06/14/2015 4   Final                  Men          Women1/2 Average Risk     3.4          3.3Average Risk          5.0          4.42X Average Risk          9.6          7.13X Average Risk          15.0          11.0                      . NonHDL 06/14/2015 212.75   Final   NOTE:  Non-HDL goal should be 30 mg/dL higher  than patient's LDL goal (i.e. LDL goal of < 70 mg/dL, would have non-HDL goal of < 100 mg/dL)      Medication List       This list is accurate as of: 06/16/15 10:26 AM.  Always use your most recent med list.               aspirin 81 MG tablet  Take 81 mg by mouth daily.     canagliflozin 100 MG Tabs tablet  Commonly known as:  INVOKANA  1 tablet before breakfast     fexofenadine 180 MG tablet  Commonly known as:  ALLEGRA  Take 180 mg by mouth daily.     hyaluronate sodium Gel  Apply 1 application topically 2 (two) times daily.     hydrochlorothiazide 12.5 MG tablet  Commonly known as:  HYDRODIURIL  Take 12.5 mg by mouth daily.     montelukast 10 MG tablet  Commonly known as:  SINGULAIR  Take 10 mg by mouth at bedtime.     non-metallic deodorant Misc  Commonly known as:  ALRA  Apply 1 application topically daily as needed.     ONE TOUCH ULTRA TEST test strip  Generic drug:  glucose blood  USE TO TEST BLOOD SUGAR ONE TIME DAILY AS INSTRUCTED     ranitidine 150 MG tablet  Commonly known as:  ZANTAC  Take 150 mg by mouth 2 (two) times daily.     rosuvastatin 10 MG tablet  Commonly known as:  CRESTOR  Take 10 mg by mouth daily.     TRULICITY A999333 0000000 Sopn  Generic drug:  Dulaglutide  INJECT 0.75 MG UNDER THE SKIN ONCE A WEEK  Vitamin D (Ergocalciferol) 50000 units Caps capsule  Commonly known as:  DRISDOL  Take 50,000 Units by mouth every 7 (seven) days.        Allergies:  Allergies  Allergen Reactions  . Meperidine Hcl     REACTION: rash  . Statins Other (See Comments)    Muscle pain. Pt able to take Crestor but not everyday.    Past Medical History  Diagnosis Date  . Diabetes mellitus without complication ([place])   . GERD (gastroesophageal reflux disease)     Pt takes OTC when needed.  . Arthritis   . Carpal tunnel syndrome   . Breast cancer of upper-outer quadrant of right female breast ([place]) 03/24/2015  . Hot flashes   . Breast cancer  [facility])     Past Surgical History  Procedure Laterality Date  . Shoulder surgery Right   . Appendectomy    . Abdominal hysterectomy    . Tonsillectomy    . Eye surgery Bilateral     Cataracts removed  . Colonoscopy    . Esophagogastroduodenoscopy    . 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 Total knee arthroplasty Right 07/18/2013    Procedure: TOTAL KNEE ARTHROPLASTY;  Surgeon: Alta Corning, MD;  Location: [place];  Service: Orthopedics;  Laterality: Right;  . Joint replacement    . Radioactive seed guided mastectomy with axillary sentinel lymph node biopsy Right 04/16/2015    Procedure: RADIOACTIVE SEED GUIDED PARTIAL MASTECTOMY WITH AXILLARY SENTINEL LYMPH NODE BIOPSY;  Surgeon: Alphonsa Overall, MD;  Location: [place];  Service: General;  Laterality: Right;    No family history on file.  Social History:  reports that she has never smoked. She has never used smokeless tobacco. She reports that she drinks alcohol. She reports that she does not use illicit drugs.  Review of Systems:  She is now on HCTZ presumably for hypertension  Lipids:  She in on Crestor from PCP, taking 3/7.  She will get joint pains if she takes it every day and will then stop it for a couple of days She thinks she has had joint pains from  Lipitor and possibly from Zocor also.  Her last LDL was 222 in October 2016, presumably not taking her medication at that time Has followed up for this with PCP recently  Her LDL is somewhat better than in 2016    Lab Results  Component Value Date   CHOL 280* 06/14/2015   HDL 67.40 06/14/2015   LDLCALC 193* 06/14/2015   LDLDIRECT 120.1 09/30/2013   TRIG 99.0 06/14/2015   CHOLHDL 4 06/14/2015    Foot exam done in 3/17, normal exam    Examination:     BP 128/62 mmHg  Pulse 70  Temp(Src) 99 F (37.2 C)  Resp 14  Ht 5\' 2"  (1.575 m)  Wt 184 lb 12.8 oz (83.825 kg)  BMI 33.79 kg/m2  SpO2 98%  Body mass index is 33.79 kg/(m^2).      ASSESSMENT/ PLAN:  Diabetes type 2  See history of present illness for detailed discussion of current diabetes management, blood sugar patterns and problems identified Her fructosamine indicates somewhat better controlled with adding Invokana to her Trulicity However she is checking readings only in the mornings at home and No postprandial readings which may have been higher before She is tolerating INVOKANA and may benefit long-term from its various benefits and hopefully can lose weight further She does need to exercise more  regularly Also will try to check readings at least half the time after meals  Follow-up in 8 weeks with A1c  Hypercholesterolemia:  She has not had control with having baseline LDL of over 200 She agrees to try Livalo which appears to be covered by her pharmacy benefits and may be better tolerated than other statins She will try to take this every other day to start with Also may need Zetia in addition    Garyn Arlotta 06/16/2015, 10:26 AM

## 2015-06-16 NOTE — Patient Instructions (Signed)
Take Livalo at least every 2 days  Check blood sugars on waking up 3  times a week  Also check blood sugars about 2 hours after a meal and do this after different meals by rotation  Recommended blood sugar levels on waking up is 90-130 and about 2 hours after meal is 130-160  Please bring your blood sugar monitor to each visit, thank you

## 2015-06-17 ENCOUNTER — Encounter: Payer: Self-pay | Admitting: *Deleted

## 2015-06-17 ENCOUNTER — Ambulatory Visit
Admission: RE | Admit: 2015-06-17 | Discharge: 2015-06-17 | Disposition: A | Discharge status: Home / Self Care | Payer: Medicare Other | Source: Ambulatory Visit | Attending: Radiation Oncology | Admitting: Radiation Oncology

## 2015-06-17 DIAGNOSIS — Z510 Encounter for antineoplastic radiation therapy: Secondary | ICD-10-CM | POA: Diagnosis not present

## 2015-06-17 NOTE — Progress Notes (Signed)
[place] Work  Clinical Social Work was referred by Pension scheme manager for assessment of psychosocial needs.  Clinical Social Worker met with patient at [facility] after radiation treatment to introduce self, explain role of CSW and discuss Support Programs available for support. Pt open to meeting with CSW and is curious about various support options. CSW encouraged Breast Cancer Support group. Pt will review info and agrees to consider supportive counseling for additional support during treatment.  Pt agrees to reach out as needed.     Clinical Social Work interventions: Supportive Psychiatric nurse education and assistance  Loren Racer, [place] Worker [place]  [place] Phone: [phone] Fax: [phone]

## 2015-06-18 ENCOUNTER — Ambulatory Visit
Admission: RE | Admit: 2015-06-18 | Discharge: 2015-06-18 | Disposition: A | Discharge status: Home / Self Care | Payer: Medicare Other | Source: Ambulatory Visit | Attending: Radiation Oncology | Admitting: Radiation Oncology

## 2015-06-18 DIAGNOSIS — Z510 Encounter for antineoplastic radiation therapy: Secondary | ICD-10-CM | POA: Diagnosis not present

## 2015-06-21 ENCOUNTER — Ambulatory Visit
Admission: RE | Admit: 2015-06-21 | Discharge: 2015-06-21 | Disposition: A | Discharge status: Home / Self Care | Payer: Medicare Other | Source: Ambulatory Visit | Attending: Radiation Oncology | Admitting: Radiation Oncology

## 2015-06-21 DIAGNOSIS — Z510 Encounter for antineoplastic radiation therapy: Secondary | ICD-10-CM | POA: Diagnosis not present

## 2015-06-22 ENCOUNTER — Encounter: Payer: Self-pay | Admitting: Radiation Oncology

## 2015-06-22 ENCOUNTER — Ambulatory Visit
Admission: RE | Admit: 2015-06-22 | Discharge: 2015-06-22 | Disposition: A | Discharge status: Home / Self Care | Payer: Medicare Other | Source: Ambulatory Visit | Attending: Radiation Oncology | Admitting: Radiation Oncology

## 2015-06-22 ENCOUNTER — Telehealth: Payer: Self-pay | Admitting: *Deleted

## 2015-06-22 VITALS — BP 134/70 | HR 71 | Temp 98.0°F | Resp 18 | Ht 62.0 in | Wt 184.7 lb

## 2015-06-22 DIAGNOSIS — Z510 Encounter for antineoplastic radiation therapy: Secondary | ICD-10-CM | POA: Diagnosis not present

## 2015-06-22 DIAGNOSIS — C50411 Malignant neoplasm of upper-outer quadrant of right female breast: Secondary | ICD-10-CM

## 2015-06-22 NOTE — Progress Notes (Signed)
Amanda [name] has received 7 fraction to her right breast.  Skin to right breast field has slight tanning, has some tenderness to her right breast.  Using Radiaplex gel bid. Appetite is okay.  Having fatigue starts around mid day until bedtime.  Denies having pain. BP 134/70 mmHg  Pulse 71  Temp(Src) 98 F (36.7 C) (Oral)  Resp 18  Ht 5\' 2"  (1.575 m)  Wt 184 lb 11.2 oz (83.779 kg)  BMI 33.77 kg/m2  SpO2 100%

## 2015-06-22 NOTE — Telephone Encounter (Signed)
Left message for a return phone call to follow up after start of radiation.   

## 2015-06-22 NOTE — Progress Notes (Signed)
Weekly Management Note Current Dose:  14 Gy  Projected Dose:  50 Gy   Narrative:  The patient presents for routine under treatment assessment.  CBCT/MVCT images/Port film x-rays were reviewed.  The chart was checked.  Amanda [name] has received 7 fraction to her right breast. Skin to right breast field has slight tanning, has some tenderness to her right breast. Using Radiaplex gel bid. Appetite is okay. Having fatigue starts around mid day until bedtime. Denies having pain. Met with Ailene Ravel previously and was given forms.  Physical Findings: Weight: 184 lb 11.2 oz (83.779 kg). Unchanged  Filed Vitals:   06/22/15 1205  BP: 134/70  Pulse: 71  Temp: 98 F (36.7 C)  Resp: 18   Impression:  The patient is tolerating radiation.  Plan:  Continue treatment as planned. Encouraged to start using Radiaplex. The patient will meet with again Ailene Ravel today.  ------------------------------------------------  Thea Silversmith, MD   This document serves as a record of services personally performed by Thea Silversmith, MD. It was created on her behalf by Derek Mound, a trained medical scribe. The creation of this record is based on the scribe's personal observations and the provider's statements to them. This document has been checked and approved by the attending provider.

## 2015-06-23 ENCOUNTER — Ambulatory Visit
Admission: RE | Admit: 2015-06-23 | Discharge: 2015-06-23 | Disposition: A | Discharge status: Home / Self Care | Payer: Medicare Other | Source: Ambulatory Visit | Attending: Radiation Oncology | Admitting: Radiation Oncology

## 2015-06-23 DIAGNOSIS — Z510 Encounter for antineoplastic radiation therapy: Secondary | ICD-10-CM | POA: Diagnosis not present

## 2015-06-24 ENCOUNTER — Ambulatory Visit
Admission: RE | Admit: 2015-06-24 | Discharge: 2015-06-24 | Disposition: A | Discharge status: Home / Self Care | Payer: Medicare Other | Source: Ambulatory Visit | Attending: Radiation Oncology | Admitting: Radiation Oncology

## 2015-06-24 DIAGNOSIS — Z510 Encounter for antineoplastic radiation therapy: Secondary | ICD-10-CM | POA: Diagnosis not present

## 2015-06-25 ENCOUNTER — Ambulatory Visit
Admission: RE | Admit: 2015-06-25 | Discharge: 2015-06-25 | Disposition: A | Discharge status: Home / Self Care | Payer: Medicare Other | Source: Ambulatory Visit | Attending: Radiation Oncology | Admitting: Radiation Oncology

## 2015-06-25 DIAGNOSIS — Z510 Encounter for antineoplastic radiation therapy: Secondary | ICD-10-CM | POA: Diagnosis not present

## 2015-06-28 ENCOUNTER — Emergency Department (HOSPITAL_COMMUNITY)
Admission: EM | Admit: 2015-06-28 | Discharge: 2015-06-29 | Disposition: A | Discharge status: Home / Self Care | Payer: Medicare Other | Attending: Emergency Medicine | Admitting: Emergency Medicine

## 2015-06-28 ENCOUNTER — Emergency Department (HOSPITAL_COMMUNITY): Payer: Medicare Other

## 2015-06-28 ENCOUNTER — Ambulatory Visit
Admission: RE | Admit: 2015-06-28 | Discharge: 2015-06-28 | Disposition: A | Discharge status: Home / Self Care | Payer: Medicare Other | Source: Ambulatory Visit | Attending: Radiation Oncology | Admitting: Radiation Oncology

## 2015-06-28 ENCOUNTER — Encounter (HOSPITAL_COMMUNITY): Payer: Self-pay | Admitting: Emergency Medicine

## 2015-06-28 DIAGNOSIS — M1990 Unspecified osteoarthritis, unspecified site: Secondary | ICD-10-CM | POA: Insufficient documentation

## 2015-06-28 DIAGNOSIS — Z79899 Other long term (current) drug therapy: Secondary | ICD-10-CM | POA: Insufficient documentation

## 2015-06-28 DIAGNOSIS — Z7984 Long term (current) use of oral hypoglycemic drugs: Secondary | ICD-10-CM | POA: Diagnosis not present

## 2015-06-28 DIAGNOSIS — E119 Type 2 diabetes mellitus without complications: Secondary | ICD-10-CM | POA: Insufficient documentation

## 2015-06-28 DIAGNOSIS — R112 Nausea with vomiting, unspecified: Secondary | ICD-10-CM | POA: Diagnosis present

## 2015-06-28 DIAGNOSIS — R509 Fever, unspecified: Secondary | ICD-10-CM | POA: Diagnosis not present

## 2015-06-28 DIAGNOSIS — Z7982 Long term (current) use of aspirin: Secondary | ICD-10-CM | POA: Insufficient documentation

## 2015-06-28 DIAGNOSIS — Z853 Personal history of malignant neoplasm of breast: Secondary | ICD-10-CM | POA: Diagnosis not present

## 2015-06-28 DIAGNOSIS — K529 Noninfective gastroenteritis and colitis, unspecified: Secondary | ICD-10-CM | POA: Diagnosis not present

## 2015-06-28 DIAGNOSIS — Z96651 Presence of right artificial knee joint: Secondary | ICD-10-CM | POA: Diagnosis not present

## 2015-06-28 DIAGNOSIS — Z510 Encounter for antineoplastic radiation therapy: Secondary | ICD-10-CM | POA: Diagnosis not present

## 2015-06-28 LAB — COMPREHENSIVE METABOLIC PANEL
ALT: 17 U/L (ref 14–54)
AST: 21 U/L (ref 15–41)
Albumin: 4.5 g/dL (ref 3.5–5.0)
Alkaline Phosphatase: 59 U/L (ref 38–126)
Anion gap: 7 (ref 5–15)
BUN: 12 mg/dL (ref 6–20)
CHLORIDE: 111 mmol/L (ref 101–111)
CO2: 23 mmol/L (ref 22–32)
CREATININE: 0.87 mg/dL (ref 0.44–1.00)
Calcium: 10.3 mg/dL (ref 8.9–10.3)
Glucose, Bld: 114 mg/dL — ABNORMAL HIGH (ref 65–99)
POTASSIUM: 4.3 mmol/L (ref 3.5–5.1)
SODIUM: 141 mmol/L (ref 135–145)
Total Bilirubin: 0.8 mg/dL (ref 0.3–1.2)
Total Protein: 7.8 g/dL (ref 6.5–8.1)

## 2015-06-28 LAB — CBC
HEMATOCRIT: 39 % (ref 36.0–46.0)
Hemoglobin: 13 g/dL (ref 12.0–15.0)
MCH: 31.6 pg (ref 26.0–34.0)
MCHC: 33.3 g/dL (ref 30.0–36.0)
MCV: 94.9 fL (ref 78.0–100.0)
Platelets: 351 10*3/uL (ref 150–400)
RBC: 4.11 MIL/uL (ref 3.87–5.11)
RDW: 16.1 % — ABNORMAL HIGH (ref 11.5–15.5)
WBC: 6 10*3/uL (ref 4.0–10.5)

## 2015-06-28 LAB — LIPASE, BLOOD: LIPASE: 28 U/L (ref 11–51)

## 2015-06-28 MED ORDER — ONDANSETRON 4 MG PO TBDP
4.0000 mg | ORAL_TABLET | Freq: Once | ORAL | Status: AC | PRN
  Administered 2015-06-28: 4 mg via ORAL
  Filled 2015-06-28 (×2): qty 1

## 2015-06-28 MED ORDER — SODIUM CHLORIDE 0.9 % IV BOLUS (SEPSIS)
1000.0000 mL | Freq: Once | INTRAVENOUS | Status: AC
  Administered 2015-06-29: 1000 mL via INTRAVENOUS

## 2015-06-28 MED ORDER — ACETAMINOPHEN 325 MG PO TABS
650.0000 mg | ORAL_TABLET | Freq: Once | ORAL | Status: AC | PRN
  Administered 2015-06-28: 650 mg via ORAL
  Filled 2015-06-28: qty 2

## 2015-06-28 NOTE — ED Notes (Signed)
Registration in room along with provider

## 2015-06-28 NOTE — ED Provider Notes (Signed)
CSN: [id number]     Arrival date & time 06/28/15  1828 History  By signing my name below, I, Amanda [name], attest that this documentation has been prepared under the direction and in the presence of Amanda Balls, MD. Electronically Signed: Virgel [name], ED Scribe. 06/29/2015. 3:58 AM.   Chief Complaint  Patient presents with  . Emesis  . Fever   The history is provided by the patient. No language interpreter was used.   HPI Comments: Amanda [name] is a 70 y.o. female with an hx of DM and GERD  who presents to the Emergency Department complaining of constant, moderate, gradually worsening nausea onset 3 days that woke her from sleep. Pt states that she has an hx of lymphoma and is currently receiving radiation therapy, most recently today. She reports emesis for the past 2 days, diarrhea for the past 2 days, weakness today, periumbilical abdominal pain for the past 3 days, and intermittent fever and chills. She has been unable to eat or drink secondary to emesis. Abdominal pain is worse with palpation. She has an hx of cesarean section and appendectomy. Per pt, she has had recent normal blood tests and was advised by her oncology team that the radiation is likely not the cause of her symptoms.. Denies recent sick contact with people with similar symptoms. Denies chemotherapy treatment at this time. Denies hematochezia,, melena, rhinorrhea, cough, congestion.  Past Medical History  Diagnosis Date  . Diabetes mellitus without complication ([place])   . GERD (gastroesophageal reflux disease)     Pt takes OTC when needed.  . Arthritis   . Carpal tunnel syndrome   . Breast cancer of upper-outer quadrant of right female breast ([place]) 03/24/2015  . Hot flashes   . Breast cancer [facility])    Past Surgical History  Procedure Laterality Date  . Shoulder surgery Right   . Appendectomy    . Abdominal hysterectomy    . Tonsillectomy    . Eye surgery Bilateral     Cataracts removed  .  Colonoscopy    . Esophagogastroduodenoscopy    . 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 Total knee arthroplasty Right 07/18/2013    Procedure: TOTAL KNEE ARTHROPLASTY;  Surgeon: Alta Corning, MD;  Location: [place];  Service: Orthopedics;  Laterality: Right;  . Joint replacement    . Radioactive seed guided mastectomy with axillary sentinel lymph node biopsy Right 04/16/2015    Procedure: RADIOACTIVE SEED GUIDED PARTIAL MASTECTOMY WITH AXILLARY SENTINEL LYMPH NODE BIOPSY;  Surgeon: Alphonsa Overall, MD;  Location: [place];  Service: General;  Laterality: Right;   History reviewed. No pertinent family history. Social History  Substance Use Topics  . Smoking status: Never Smoker   . Smokeless tobacco: Never Used  . Alcohol Use: Yes     Comment: socially, occ weekends   OB History    No data available     Review of Systems A complete 10 [name] review of systems was obtained and all systems are negative except as noted in the HPI and PMH.   Allergies  Meperidine hcl and Statins  Home Medications   Prior to Admission medications   Medication Sig Start Date End Date Taking? Authorizing Provider  aspirin 81 MG tablet Take 81 mg by mouth daily.   Yes Historical Provider, MD  canagliflozin (INVOKANA) 100 MG TABS tablet 1 tablet before breakfast 05/04/15  Yes [name], MD  fexofenadine (ALLEGRA) 180 MG tablet Take 180  mg by mouth daily.   Yes Historical Provider, MD  hyaluronate sodium (RADIAPLEXRX) GEL Apply 1 application topically 2 (two) times daily.   Yes Historical Provider, MD  montelukast (SINGULAIR) 10 MG tablet Take 10 mg by mouth at bedtime.   Yes Historical Provider, MD  non-metallic deodorant [name]) MISC Apply 1 application topically daily as needed.   Yes Historical Provider, MD  Pitavastatin Calcium 2 MG TABS 2mg  06/16/15  Yes [name], MD  ranitidine (ZANTAC) 150 MG tablet Take 150 mg by mouth 2 (two) times daily.   Yes Historical Provider,  MD  TRULICITY A999333 0000000 SOPN INJECT 0.75 MG UNDER THE SKIN ONCE A WEEK 09/29/14  Yes [name], MD  ONE TOUCH ULTRA TEST test strip USE TO TEST BLOOD SUGAR ONE TIME DAILY AS INSTRUCTED 04/08/15   [name], MD   BP 143/64 mmHg  Pulse 80  Temp(Src) 100.3 F (37.9 C) (Oral)  Resp 18  SpO2 99% Physical Exam  Constitutional: She is oriented to person, place, and time. She appears well-developed and well-nourished. No distress.  HENT:  Head: Normocephalic and atraumatic.  Nose: Nose normal.  Mouth/Throat: Oropharynx is clear and moist. No oropharyngeal exudate.  Eyes: Conjunctivae and EOM are normal. Pupils are equal, round, and reactive to light. No scleral icterus.  Neck: Normal range of motion. Neck supple. No JVD present. No tracheal deviation present. No thyromegaly present.  Cardiovascular: Normal rate, regular rhythm and normal heart sounds.  Exam reveals no gallop and no friction rub.   No murmur heard. Pulmonary/Chest: Effort normal and breath sounds normal. No respiratory distress. She has no wheezes. She exhibits no tenderness.  Abdominal: Soft. Bowel sounds are normal. She exhibits no distension and no mass. There is tenderness. There is no rebound and no guarding.  Diffuse abdominal tenderness to TTP.  Musculoskeletal: Normal range of motion. She exhibits no edema or tenderness.  Lymphadenopathy:    She has no cervical adenopathy.  Neurological: She is alert and oriented to person, place, and time. No cranial nerve deficit. She exhibits normal muscle tone.  Skin: Skin is warm and dry. No rash noted. No erythema. No pallor.  Nursing note and vitals reviewed.   ED Course  Procedures   DIAGNOSTIC STUDIES: Oxygen Saturation is 99% on RA, normal by my interpretation.    COORDINATION OF CARE: 11:04 PM Will order abdominal CT, IV fluids. Discussed treatment plan with pt at bedside and pt agreed to plan.   Labs Review Labs Reviewed  COMPREHENSIVE METABOLIC PANEL -  Abnormal; Notable for the following:    Glucose, Bld 114 (*)    All other components within normal limits  CBC - Abnormal; Notable for the following:    RDW 16.1 (*)    All other components within normal limits  URINALYSIS, ROUTINE W REFLEX MICROSCOPIC (NOT AT [facility]) - Abnormal; Notable for the following:    APPearance CLOUDY (*)    Specific Gravity, Urine 1.031 (*)    Glucose, UA >1000 (*)    All other components within normal limits  URINE MICROSCOPIC-ADD ON - Abnormal; Notable for the following:    Squamous Epithelial / LPF 6-30 (*)    Bacteria, UA RARE (*)    All other components within normal limits  LIPASE, BLOOD    Imaging Review Ct Abdomen Pelvis W Contrast  06/29/2015  CLINICAL DATA:  Initial evaluation for acute abdominal pain, nausea, vomiting. EXAM: CT ABDOMEN AND PELVIS WITH CONTRAST TECHNIQUE: Multidetector CT imaging of the abdomen and  pelvis was performed using the standard protocol following bolus administration of intravenous contrast. CONTRAST:  13mL ISOVUE-300 IOPAMIDOL (ISOVUE-300) INJECTION 61% COMPARISON:  None. FINDINGS: Visualized lung bases are clear. No pleural or pericardial effusion. Liver demonstrates a normal contrast enhanced appearance. Gallbladder normal. No biliary dilatation. Spleen, adrenal glands, and pancreas demonstrate a normal contrast enhanced appearance. Kidneys are equal in size with symmetric enhancement. No nephrolithiasis, hydronephrosis, or focal enhancing renal mass. Stomach grossly unremarkable, although evaluation somewhat limited by of motion artifact. No evidence for bowel obstruction. No abnormal wall thickening, mucosal enhancement, or inflammatory fat stranding seen about the bowels. Appendix not visualize, consistent with history prior appendectomy. Bladder decompressed without definite abnormality. Uterus is absent. Right ovary unremarkable. Left ovary not discretely identified. No free air identified. Possible trace free fluid within the  pelvis (series 2, image 67). This is of doubtful clinical significance. Few mildly prominent mesenteric lymph nodes measuring up to 1 cm present at the root of the mesenteric. Minimal hazy mesenteric stranding at the root of the mesentery as well. Normal intravascular enhancement seen throughout the intra-abdominal aorta and its branch vessels. Minimal atheromatous plaque about the aortic bifurcation. No aneurysm. No acute osseus abnormality. No worrisome lytic or blastic osseous lesions. Degenerative spondylolysis at L4-5 and L5-S1. IMPRESSION: 1. Mild hazy stranding at the root of the mesentery with a few prominent mesenteric lymph nodes measuring to the upper limits of normal at 1 cm. These findings are nonspecific, but may be reactive in nature, most commonly to an underlying occult enteritis. 2. No other acute intra-abdominal or pelvic process identified. 3. Status post appendectomy and hysterectomy. Electronically Signed   By: Jeannine Boga M.D.   On: 06/29/2015 03:54   I have personally reviewed and evaluated these images and lab results as part of my medical decision-making.   EKG Interpretation None      MDM   Final diagnoses:  None    Patient presents to the ED for fever and abdominal pain.  She has diffuse tenderness.  I hope this is a mild gastroenteritis causing her symptoms as she has vomiting and diarrhea.  Will obtain CT scan of the abd/pel as she is high risk due to her age and comorbidities.  Her nausea has improved with zofran.  Will give 1L IVF, also check UA for infection.  Tylenol given at triage for fever.  CT reveals enteritis, no acute emergent causes.  She appears well and in NAD. VS remain within her normal limits and she is safe for DC.   I personally performed the services described in this documentation, which was scribed in my presence. The recorded information has been reviewed and is accurate.     Amanda Balls, MD 06/29/15 [phone]

## 2015-06-28 NOTE — ED Notes (Signed)
Pt states that she has had N/V/D with fever since Friday. Radiation for lymphoma this week. Alert and oriented.

## 2015-06-29 ENCOUNTER — Ambulatory Visit
Admission: RE | Admit: 2015-06-29 | Discharge: 2015-06-29 | Disposition: A | Discharge status: Home / Self Care | Payer: Medicare Other | Source: Ambulatory Visit | Attending: Radiation Oncology | Admitting: Radiation Oncology

## 2015-06-29 ENCOUNTER — Encounter: Payer: Self-pay | Admitting: Radiation Oncology

## 2015-06-29 VITALS — Temp 98.0°F | Ht 62.0 in | Wt 184.7 lb

## 2015-06-29 DIAGNOSIS — K529 Noninfective gastroenteritis and colitis, unspecified: Secondary | ICD-10-CM | POA: Diagnosis not present

## 2015-06-29 DIAGNOSIS — Z510 Encounter for antineoplastic radiation therapy: Secondary | ICD-10-CM | POA: Diagnosis not present

## 2015-06-29 DIAGNOSIS — C50411 Malignant neoplasm of upper-outer quadrant of right female breast: Secondary | ICD-10-CM

## 2015-06-29 LAB — URINALYSIS, ROUTINE W REFLEX MICROSCOPIC
Bilirubin Urine: NEGATIVE
Hgb urine dipstick: NEGATIVE
KETONES UR: NEGATIVE mg/dL
LEUKOCYTES UA: NEGATIVE
NITRITE: NEGATIVE
PH: 5 (ref 5.0–8.0)
PROTEIN: NEGATIVE mg/dL
Specific Gravity, Urine: 1.031 — ABNORMAL HIGH (ref 1.005–1.030)

## 2015-06-29 LAB — URINE MICROSCOPIC-ADD ON

## 2015-06-29 MED ORDER — IOPAMIDOL (ISOVUE-300) INJECTION 61%
100.0000 mL | Freq: Once | INTRAVENOUS | Status: AC | PRN
  Administered 2015-06-29: 100 mL via INTRAVENOUS

## 2015-06-29 NOTE — Progress Notes (Signed)
Amanda [name] has received 12 fractions to her right breast.  Skin to right breast with hyperpigmentation, sensitive to touch.  Using Radiaplex gel bid.  Appetite altered because of virus since Friday went to ER yesterday.  Having fatigue in the afternoon.  Denies pain today to breast, having cramping in abdomen. Temp(Src) 98 F (36.7 C) (Oral)  Ht 5\' 2"  (1.575 m)  Wt 184 lb 11.2 oz (83.779 kg)  BMI 33.77 kg/m2

## 2015-06-29 NOTE — Discharge Instructions (Signed)
Abdominal Pain, Adult Amanda [name], your CT scan shows enteritis.  Take ranitidine as needed for pain and see your primary care doctor within 3 days for close follow up. If symptoms worsen, come back to the ED immediately. Thank you.   Many things can cause belly (abdominal) pain. Most times, the belly pain is not dangerous. Many cases of belly pain can be watched and treated at home. HOME CARE   Do not take medicines that help you go poop (laxatives) unless told to by your doctor.  Only take medicine as told by your doctor.  Eat or drink as told by your doctor. Your doctor will tell you if you should be on a special diet. GET HELP IF:  You do not know what is causing your belly pain.  You have belly pain while you are sick to your stomach (nauseous) or have runny poop (diarrhea).  You have pain while you pee or poop.  Your belly pain wakes you up at night.  You have belly pain that gets worse or better when you eat.  You have belly pain that gets worse when you eat fatty foods.  You have a fever. GET HELP RIGHT AWAY IF:   The pain does not go away within 2 hours.  You keep throwing up (vomiting).  The pain changes and is only in the right or left part of the belly.  You have bloody or tarry looking poop. MAKE SURE YOU:   Understand these instructions.  Will watch your condition.  Will get help right away if you are not doing well or get worse.   This information is not intended to replace advice given to you by your health care provider. Make sure you discuss any questions you have with your health care provider.   Document Released: 07/12/2007 Document Revised: 02/13/2014 Document Reviewed: 10/02/2012 Elsevier Interactive Patient Education [employer].

## 2015-06-29 NOTE — Progress Notes (Signed)
Weekly Management Note Current Dose:  24 Gy  Projected Dose:  50 Gy   Narrative:  The patient presents for routine under treatment assessment.  CBCT/MVCT images/Port film x-rays were reviewed.  The chart was checked.  Mrs. Kassam has received 12 fractions to her right breast. Skin to right breast with hyperpigmentation, sensitive to touch. Using Radiaplex gel bid. Appetite altered because of virus since Friday went to ER yesterday. Having fatigue in the afternoon. Denies pain today to breast, having cramping in abdomen.  Physical Findings: Weight: 184 lb 11.2 oz (83.779 kg). Unchanged. Minimally dark.  Filed Vitals:   06/29/15 0922  Temp: 98 F (36.7 C)   Impression:  The patient is tolerating radiation.  Plan:  Continue treatment as planned. Continues Radiaplex.  ------------------------------------------------  Thea Silversmith, MD    This document serves as a record of services personally performed by Thea Silversmith, MD. It was created on her behalf by  Lendon Collar, a trained medical scribe. The creation of this record is based on the scribe's personal observations and the provider's statements to them. This document has been checked and approved by the attending provider.

## 2015-06-30 ENCOUNTER — Ambulatory Visit
Admission: RE | Admit: 2015-06-30 | Discharge: 2015-06-30 | Disposition: A | Discharge status: Home / Self Care | Payer: Medicare Other | Source: Ambulatory Visit | Attending: Radiation Oncology | Admitting: Radiation Oncology

## 2015-06-30 DIAGNOSIS — Z510 Encounter for antineoplastic radiation therapy: Secondary | ICD-10-CM | POA: Diagnosis not present

## 2015-07-01 ENCOUNTER — Ambulatory Visit
Admission: RE | Admit: 2015-07-01 | Discharge: 2015-07-01 | Disposition: A | Discharge status: Home / Self Care | Payer: Medicare Other | Source: Ambulatory Visit | Attending: Radiation Oncology | Admitting: Radiation Oncology

## 2015-07-01 DIAGNOSIS — Z510 Encounter for antineoplastic radiation therapy: Secondary | ICD-10-CM | POA: Diagnosis not present

## 2015-07-02 ENCOUNTER — Ambulatory Visit
Admission: RE | Admit: 2015-07-02 | Discharge: 2015-07-02 | Disposition: A | Discharge status: Home / Self Care | Payer: Medicare Other | Source: Ambulatory Visit | Attending: Radiation Oncology | Admitting: Radiation Oncology

## 2015-07-02 DIAGNOSIS — Z510 Encounter for antineoplastic radiation therapy: Secondary | ICD-10-CM | POA: Diagnosis not present

## 2015-07-06 ENCOUNTER — Encounter: Payer: Self-pay | Admitting: Radiation Oncology

## 2015-07-06 ENCOUNTER — Ambulatory Visit
Admission: RE | Admit: 2015-07-06 | Discharge: 2015-07-06 | Disposition: A | Discharge status: Home / Self Care | Payer: Medicare Other | Source: Ambulatory Visit | Attending: Radiation Oncology | Admitting: Radiation Oncology

## 2015-07-06 ENCOUNTER — Ambulatory Visit
Admission: RE | Admit: 2015-07-06 | Discharge: 2015-07-06 | Disposition: A | Discharge status: Home / Self Care | Payer: BLUE CROSS/BLUE SHIELD | Source: Ambulatory Visit | Attending: Radiation Oncology | Admitting: Radiation Oncology

## 2015-07-06 VITALS — BP 129/51 | HR 63 | Temp 97.9°F | Resp 16 | Ht 62.0 in | Wt 184.0 lb

## 2015-07-06 DIAGNOSIS — C50411 Malignant neoplasm of upper-outer quadrant of right female breast: Secondary | ICD-10-CM

## 2015-07-06 DIAGNOSIS — Z510 Encounter for antineoplastic radiation therapy: Secondary | ICD-10-CM | POA: Diagnosis not present

## 2015-07-06 NOTE — Progress Notes (Signed)
Weekly Management Note Current Dose:  32 Gy  Projected Dose:  50 Gy   Narrative:  The patient presents for routine under treatment assessment.  CBCT/MVCT images/Port film x-rays were reviewed.  The chart was checked.  Mrs. Cloos has received 16 fractions to her right breast. Right breast with some hyperpigmentation. She is using Radiaplex gel bid, her appetite is fair, she has fatigue in the middle of the day and she denies pain.  Physical Findings: Weight: 184 lb (83.462 kg). Slightly hyperpigmented right breast with no moist desquamation.  Filed Vitals:   07/06/15 0908  BP: 129/51  Pulse: 63  Temp: 97.9 F (36.6 C)  Resp: 16   Impression:  The patient is tolerating radiation.  Plan:  Continue treatment as planned. Continue Radiaplex.  ------------------------------------------------  Thea Silversmith, MD  This document serves as a record of services personally performed by Thea Silversmith, MD. It was created on her behalf by Darcus Austin, a trained medical scribe. The creation of this record is based on the scribe's personal observations and the provider's statements to them. This document has been checked and approved by the attending provider.

## 2015-07-06 NOTE — Progress Notes (Signed)
Amanda [name] has received 16 fractions to her right breast.  Right breast with darken hyperpigmentation.  Using Radiaplex gel bid.  Appetite is okay.  Fatigue in the middle of the day.  Denies pain. BP 129/51 mmHg  Pulse 63  Temp(Src) 97.9 F (36.6 C) (Oral)  Resp 16  Ht 5\' 2"  (1.575 m)  Wt 184 lb (83.462 kg)  BMI 33.65 kg/m2  SpO2 98%

## 2015-07-07 ENCOUNTER — Ambulatory Visit
Admission: RE | Admit: 2015-07-07 | Discharge: 2015-07-07 | Disposition: A | Discharge status: Home / Self Care | Payer: Medicare Other | Source: Ambulatory Visit | Attending: Radiation Oncology | Admitting: Radiation Oncology

## 2015-07-07 DIAGNOSIS — Z510 Encounter for antineoplastic radiation therapy: Secondary | ICD-10-CM | POA: Diagnosis not present

## 2015-07-08 ENCOUNTER — Other Ambulatory Visit: Payer: Self-pay | Admitting: *Deleted

## 2015-07-08 ENCOUNTER — Ambulatory Visit
Admission: RE | Admit: 2015-07-08 | Discharge: 2015-07-08 | Disposition: A | Discharge status: Home / Self Care | Payer: Medicare Other | Source: Ambulatory Visit | Attending: Radiation Oncology | Admitting: Radiation Oncology

## 2015-07-08 DIAGNOSIS — Z510 Encounter for antineoplastic radiation therapy: Secondary | ICD-10-CM | POA: Diagnosis not present

## 2015-07-08 MED ORDER — GLUCOSE BLOOD VI STRP: ORAL_STRIP | Status: DC

## 2015-07-08 MED ORDER — DULAGLUTIDE 0.75 MG/0.5ML ~~LOC~~ SOAJ: SUBCUTANEOUS | Status: DC

## 2015-07-09 ENCOUNTER — Inpatient Hospital Stay: Admission: RE | Admit: 2015-07-09 | Payer: Self-pay | Source: Ambulatory Visit | Admitting: Radiation Oncology

## 2015-07-09 ENCOUNTER — Ambulatory Visit
Admission: RE | Admit: 2015-07-09 | Discharge: 2015-07-09 | Disposition: A | Discharge status: Home / Self Care | Payer: Medicare Other | Source: Ambulatory Visit | Attending: Radiation Oncology | Admitting: Radiation Oncology

## 2015-07-09 DIAGNOSIS — Z510 Encounter for antineoplastic radiation therapy: Secondary | ICD-10-CM | POA: Diagnosis not present

## 2015-07-12 ENCOUNTER — Ambulatory Visit
Admission: RE | Admit: 2015-07-12 | Discharge: 2015-07-12 | Disposition: A | Discharge status: Home / Self Care | Payer: Medicare Other | Source: Ambulatory Visit | Attending: Radiation Oncology | Admitting: Radiation Oncology

## 2015-07-12 ENCOUNTER — Ambulatory Visit
Admission: RE | Admit: 2015-07-12 | Payer: BLUE CROSS/BLUE SHIELD | Source: Ambulatory Visit | Admitting: Radiation Oncology

## 2015-07-12 DIAGNOSIS — Z510 Encounter for antineoplastic radiation therapy: Secondary | ICD-10-CM | POA: Diagnosis not present

## 2015-07-13 ENCOUNTER — Ambulatory Visit
Admission: RE | Admit: 2015-07-13 | Discharge: 2015-07-13 | Disposition: A | Discharge status: Home / Self Care | Payer: Medicare Other | Source: Ambulatory Visit | Attending: Radiation Oncology | Admitting: Radiation Oncology

## 2015-07-13 ENCOUNTER — Encounter: Payer: Self-pay | Admitting: Radiation Oncology

## 2015-07-13 ENCOUNTER — Encounter: Payer: Self-pay | Admitting: General Practice

## 2015-07-13 DIAGNOSIS — C50411 Malignant neoplasm of upper-outer quadrant of right female breast: Secondary | ICD-10-CM

## 2015-07-13 DIAGNOSIS — Z510 Encounter for antineoplastic radiation therapy: Secondary | ICD-10-CM | POA: Diagnosis not present

## 2015-07-13 MED ORDER — OXYCODONE-ACETAMINOPHEN 5-325 MG PO TABS: 1.0000 | ORAL_TABLET | ORAL | Status: DC | PRN

## 2015-07-13 NOTE — Progress Notes (Signed)
Ms. Amanda [name] has received 21 fractions to her right breast.  Note hyperpigmentation of breast and inframmary fold. Skin intact,.  Reports aching of her breast at this time without relief with Tylenol, and also reports fatigue.

## 2015-07-13 NOTE — Progress Notes (Signed)
  Radiation Oncology         [phone] ________________________________  Name: Amanda [name] MRN: [id number]  Date: 07/13/2015  DOB: November 13, 1945  Weekly Radiation Therapy Management  Breast cancer of upper-outer quadrant of right female breast ([place])  Current Dose: 42 Gy     Planned Dose:  50 Gy  Narrative . . . . . . . . The patient presents for routine under treatment assessment.                                   Amanda [name] has received 21 fractions to her right breast. Note hyperpigmentation of breast and inframmary fold. Skin intact,. Reports aching of her breast at this time without relief with Tylenol, and also reports fatigue. Breast pain is keeping her awake at night.                                 Set-up films were reviewed.                                 The chart was checked. Physical Findings. . .  vitals were not taken for this visit.. Weight essentially stable.  No significant changes. The heart has a regular rhythm and rate. The lungs are clear to auscultation. She has some hyperpigmentation of the right breast and inframammary fold Impression . . . . . . . The patient is tolerating radiation. Plan . . . . . . . . . . . . Continue treatment as planned. I mentioned that Aleve or Advil should work better than Tylenol. I prescribed her Perxcocet since Advil/aleve upsets her stomach.  ________________________________   Blair Promise, PhD, MD    This document serves as a record of services personally performed by Gery Pray, MD. It was created on his behalf by Lendon Collar, a trained medical scribe. The creation of this record is based on the scribe's personal observations and the provider's statements to them. This document has been checked and approved by the attending provider.

## 2015-07-13 NOTE — Progress Notes (Signed)
Spiritual Care Note  Reached Ms Wedding by phone to offer f/u support.  Per pt, she is adjusting fairly well and feels less distress now; she cites [facility] staff's availability, kindness, and willingness to answer all of her questions as a big source of support and comfort through tx process.  She is aware of ongoing Support Team availability as desired.  [place], [place], [facility] Pager [phone] Voicemail [phone]

## 2015-07-14 ENCOUNTER — Ambulatory Visit
Admission: RE | Admit: 2015-07-14 | Discharge: 2015-07-14 | Disposition: A | Discharge status: Home / Self Care | Payer: Medicare Other | Source: Ambulatory Visit | Attending: Radiation Oncology | Admitting: Radiation Oncology

## 2015-07-14 DIAGNOSIS — Z510 Encounter for antineoplastic radiation therapy: Secondary | ICD-10-CM | POA: Diagnosis not present

## 2015-07-15 ENCOUNTER — Ambulatory Visit
Admission: RE | Admit: 2015-07-15 | Discharge: 2015-07-15 | Disposition: A | Discharge status: Home / Self Care | Payer: Medicare Other | Source: Ambulatory Visit | Attending: Radiation Oncology | Admitting: Radiation Oncology

## 2015-07-15 DIAGNOSIS — Z510 Encounter for antineoplastic radiation therapy: Secondary | ICD-10-CM | POA: Diagnosis not present

## 2015-07-16 ENCOUNTER — Ambulatory Visit
Admission: RE | Admit: 2015-07-16 | Discharge: 2015-07-16 | Disposition: A | Discharge status: Home / Self Care | Payer: Medicare Other | Source: Ambulatory Visit | Attending: Radiation Oncology | Admitting: Radiation Oncology

## 2015-07-16 DIAGNOSIS — Z510 Encounter for antineoplastic radiation therapy: Secondary | ICD-10-CM | POA: Diagnosis not present

## 2015-07-19 ENCOUNTER — Telehealth: Payer: Self-pay | Admitting: *Deleted

## 2015-07-19 ENCOUNTER — Ambulatory Visit
Admission: RE | Admit: 2015-07-19 | Discharge: 2015-07-19 | Disposition: A | Discharge status: Home / Self Care | Payer: Medicare Other | Source: Ambulatory Visit | Attending: Radiation Oncology | Admitting: Radiation Oncology

## 2015-07-19 ENCOUNTER — Encounter: Payer: Self-pay | Admitting: Radiation Oncology

## 2015-07-19 DIAGNOSIS — Z510 Encounter for antineoplastic radiation therapy: Secondary | ICD-10-CM | POA: Diagnosis not present

## 2015-07-19 NOTE — Telephone Encounter (Signed)
  Oncology Nurse Navigator Documentation    Left vm to congratulate pt on completion of xrt and to assess needs. Contact information provided. Telephone: [place] Call (07/19/15 1200) Abnormal Finding Date: 03/10/15 (07/19/15 1200) Confirmed Diagnosis Date: 03/22/15 (07/19/15 1200)     Patient Visit Type: RadOnc (07/19/15 1200) Treatment Phase: Final Radiation Tx (07/19/15 1200) Barriers/Navigation Needs: No barriers at this time;No Questions;No Needs (07/19/15 1200)   Interventions: Referrals (07/19/15 1200) Referrals: Survivorship (07/19/15 1200)          Acuity: Level 1 (07/19/15 1200)         Time Spent with Patient: 15 (07/19/15 1200)

## 2015-07-19 NOTE — Progress Notes (Signed)
EOT education done. Checked right breast slight moist desquamation with pain 2/10 not taking pain medication taking bra off.  Gentian violet applied to right breast inframammary fold area and telfa pad applied.  Stated the right breast felt better. Asked  to call on Tuesday to let me know how her right breast  is feeling.  May  applying the Gentian violet again today if needed.  Can be used one to two times a d day.  Dr. Lisbeth Renshaw approved usage of Gentian violet and telfa to right breast. BP 127/54 mmHg  Pulse 70  Temp(Src) 97.7 F (36.5 C) (Oral)  Resp 18  Ht 5\' 2"  (1.575 m)  Wt 182 lb (82.555 kg)  BMI 33.28 kg/m2  SpO2 98%

## 2015-07-26 ENCOUNTER — Ambulatory Visit (HOSPITAL_BASED_OUTPATIENT_CLINIC_OR_DEPARTMENT_OTHER): Payer: Medicare Other | Admitting: Oncology

## 2015-07-26 VITALS — BP 118/61 | HR 84 | Temp 98.8°F | Resp 18 | Ht 62.0 in | Wt 184.1 lb

## 2015-07-26 DIAGNOSIS — E119 Type 2 diabetes mellitus without complications: Secondary | ICD-10-CM

## 2015-07-26 DIAGNOSIS — Z170 Estrogen receptor positive status [ER+]: Secondary | ICD-10-CM | POA: Diagnosis not present

## 2015-07-26 DIAGNOSIS — C50411 Malignant neoplasm of upper-outer quadrant of right female breast: Secondary | ICD-10-CM | POA: Diagnosis not present

## 2015-07-26 MED ORDER — VENLAFAXINE HCL ER 37.5 MG PO CP24: 37.5000 mg | ORAL_CAPSULE | Freq: Every day | ORAL | Status: DC

## 2015-07-26 MED ORDER — ANASTROZOLE 1 MG PO TABS: 1.0000 mg | ORAL_TABLET | Freq: Every day | ORAL | Status: DC

## 2015-07-26 NOTE — Progress Notes (Signed)
Amanda [name]  [phone] [phone]) [phone]     ID: Amanda [name] DOB: 20-Oct-1945  MR#: [id number]  FUX#:[id number]  Patient Care Team: Amanda Contras, MD as PCP - General (Family Medicine) Amanda Cheese, NP as Nurse Practitioner (Hematology and Oncology) Amanda Cruel, MD as Consulting Physician (Oncology) Amanda Silversmith, MD as Consulting Physician (Radiation Oncology) Amanda Overall, MD as Consulting Physician (General Surgery) Amanda Snare, MD as Consulting Physician (Endocrinology) PCP: Amanda Kroner, MD GYN: OTHER MD:  CHIEF COMPLAINT: estrogen receptor positive breast cancer  CURRENT TREATMENT: To start anastrozole   BREAST CANCER HISTORY:  from the original intake note:  Amanda [name] had routine screening mammography at [facility] 03/10/2015. The breast density was category B. There was a new cluster of nodules in the right breast at the 11:00 position measuring approximately 5 mm. Ultrasonography 03/15/2014 confirmed a 1.1 center meter oval mass in the right breast at the 10:00 position 6 cm from the nipple. There was a benign 0.8 cm simple cyst immediately adjacent to the mass. The right axilla was sonographically benign.  Biopsy of the right breast mass in question 03/22/2015 showed ([id number]) invasive ductal carcinoma, grade 1, estrogen receptor 95% positive, progesterone receptor 80% positive, both with strong staining intensity, with an MIB-1 of 15%, and no HER-2 amplification, the signals ratio being 1.58 and the number per cell 3.00.  Her subsequent history is as detailed below  INTERVAL HISTORY: Amanda [name] returns today for follow-up of her early stage breast cancer. Since her last visit here she completed her radiation treatments, last day being a week ago.  She generally did well with radiation. Over the last couple of weeks she has become increasingly fatigued and started taking naps. She has just started getting herself back into some kind  of an exercise routine. She also had significant desquamation.  REVIEW OF SYSTEMS: Aside from hot flashes, which Amanda [name] previously prevented with Premarin, a detailed review of systems today was stable  PAST MEDICAL HISTORY: Past Medical History  Diagnosis Date  . Diabetes mellitus without complication ([place])   . GERD (gastroesophageal reflux disease)     Pt takes OTC when needed.  . Arthritis   . Carpal tunnel syndrome   . Breast cancer of upper-outer quadrant of right female breast ([place]) 03/24/2015  . Hot flashes   . Breast cancer ([place])     PAST SURGICAL HISTORY: Past Surgical History  Procedure Laterality Date  . Shoulder surgery Right   . Appendectomy    . Abdominal hysterectomy    . Tonsillectomy    . Eye surgery Bilateral     Cataracts removed  . Colonoscopy    . Esophagogastroduodenoscopy    . Total knee arthroplasty Right 07/18/2013    Amanda [name]  . Total knee arthroplasty Right 07/18/2013    Procedure: TOTAL KNEE ARTHROPLASTY;  Surgeon: Amanda Corning, MD;  Location: [facility];  Service: Orthopedics;  Laterality: Right;  . Joint replacement    . Radioactive seed guided mastectomy with axillary sentinel lymph node biopsy Right 04/16/2015    Procedure: RADIOACTIVE SEED GUIDED PARTIAL MASTECTOMY WITH AXILLARY SENTINEL LYMPH NODE BIOPSY;  Surgeon: Amanda Overall, MD;  Location: [facility];  Service: General;  Laterality: Right;    FAMILY HISTORY No family history on file. The patient's father died in his 62s from reasons unclear to the patient. Her mother died from heart disease at the age of 35. The patient had one brother, 5 sisters. There is no history  of breast or ovarian cancer in the family to her knowledge.  GYNECOLOGIC HISTORY:  No LMP recorded. Patient has had a hysterectomy. Menarche age 49, first live birth age 62. She is GX P2. She underwent hysterectomy with bilateral salpingo-oophorectomy in her 54s. 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 She is divorced and lives alone, with no pets. Son Amanda [name] lives in [place] where he manages an auto zone. Daughter Amanda [name] lives in [place]. She works for [employer]. The patient has 4 grandchildren. She is a Psychologist, forensic.    ADVANCED DIRECTIVES: Not in place   HEALTH MAINTENANCE: Social History  Substance Use Topics  . Smoking status: Never Smoker   . Smokeless tobacco: Never Used  . Alcohol Use: Yes     Comment: socially, occ weekends     Colonoscopy: 2016/ [facility]  PAP: Status post hysterectomy  Bone density: 2016/ normal/ [facility]  Lipid panel:  Allergies  Allergen Reactions  . Meperidine Hcl     REACTION: rash  . Statins Other (See Comments)    Muscle pain. Pt able to take Crestor but not everyday.    Current Outpatient Prescriptions  Medication Sig Dispense Refill  . aspirin 81 MG tablet Take 81 mg by mouth daily.    . canagliflozin (INVOKANA) 100 MG TABS tablet 1 tablet before breakfast 30 tablet 3  . Dulaglutide (TRULICITY) 1.61 WR/6.0AV SOPN INJECT 0.75 MG UNDER THE SKIN ONCE A WEEK 6 mL 1  . fexofenadine (ALLEGRA) 180 MG tablet Take 180 mg by mouth daily.    Marland Kitchen glucose blood (ONE TOUCH ULTRA TEST) test strip USE TO TEST BLOOD SUGAR ONE TIME DAILY AS INSTRUCTED Dx code E11.9 100 each 1  . hyaluronate sodium (RADIAPLEXRX) GEL Apply 1 application topically 2 (two) times daily.    . montelukast (SINGULAIR) 10 MG tablet Take 10 mg by mouth at bedtime.    . non-metallic deodorant Amanda [name]) MISC Apply 1 application topically daily as needed.    Marland Kitchen oxyCODONE-acetaminophen (PERCOCET/ROXICET) 5-325 MG tablet Take 1 tablet by mouth every 4 (four) hours as needed for severe pain. (Patient not taking: Reported on 07/19/2015) 30 tablet 0  . Pitavastatin Calcium 2 MG TABS 96m 30 tablet 3  . ranitidine (ZANTAC) 150 MG tablet Take 150 mg by mouth 2 (two) times daily.     No current  facility-administered medications for this visit.    OBJECTIVE: Middle-aged African-American woman In no acute distress Filed Vitals:   07/26/15 1406  BP: 118/61  Pulse: 84  Temp: 98.8 F (37.1 C)  Resp: 18     Body mass index is 33.66 kg/(m^2).    ECOG FS:1 - Symptomatic but completely ambulatory  Sclerae unicteric, EOMs intact Oropharynx clear and moist No cervical or supraclavicular adenopathy Lungs no rales or rhonchi Heart regular rate and rhythm Abd soft, nontender, positive bowel sounds MSK no focal spinal tenderness, no upper extremity lymphedema Neuro: nonfocal, well oriented, appropriate affect Breasts: The right breast is status post lumpectomy and radiation. There is significant hyperpigmentation over the radiation port. There is mild desquamation in the inframammary fold. There is no evidence of disease recurrence. The right axilla is benign. The left breast is unremarkable.    LAB RESULTS:  CMP     Component Value Date/Time   NA 141 06/28/2015 2006   NA 143 03/31/2015 0831   K 4.3 06/28/2015 2006   K 4.0  03/31/2015 0831   CL 111 06/28/2015 2006   CO2 23 06/28/2015 2006   CO2 27 03/31/2015 0831   GLUCOSE 114* 06/28/2015 2006   GLUCOSE 149* 03/31/2015 0831   BUN 12 06/28/2015 2006   BUN 17.6 03/31/2015 0831   CREATININE 0.87 06/28/2015 2006   CREATININE 0.9 03/31/2015 0831   CALCIUM 10.3 06/28/2015 2006   CALCIUM 10.2 03/31/2015 0831   PROT 7.8 06/28/2015 2006   PROT 7.8 03/31/2015 0831   ALBUMIN 4.5 06/28/2015 2006   ALBUMIN 3.9 03/31/2015 0831   AST 21 06/28/2015 2006   AST 20 03/31/2015 0831   ALT 17 06/28/2015 2006   ALT 17 03/31/2015 0831   ALKPHOS 59 06/28/2015 2006   ALKPHOS 55 03/31/2015 0831   BILITOT 0.8 06/28/2015 2006   BILITOT 0.53 03/31/2015 0831   GFRNONAA >60 06/28/2015 2006   GFRAA >60 06/28/2015 2006    INo results found for: SPEP, UPEP  Lab Results  Component Value Date   WBC 6.0 06/28/2015   NEUTROABS 1.1* 03/31/2015    HGB 13.0 06/28/2015   HCT 39.0 06/28/2015   MCV 94.9 06/28/2015   PLT 351 06/28/2015      Chemistry      Component Value Date/Time   NA 141 06/28/2015 2006   NA 143 03/31/2015 0831   K 4.3 06/28/2015 2006   K 4.0 03/31/2015 0831   CL 111 06/28/2015 2006   CO2 23 06/28/2015 2006   CO2 27 03/31/2015 0831   BUN 12 06/28/2015 2006   BUN 17.6 03/31/2015 0831   CREATININE 0.87 06/28/2015 2006   CREATININE 0.9 03/31/2015 0831      Component Value Date/Time   CALCIUM 10.3 06/28/2015 2006   CALCIUM 10.2 03/31/2015 0831   ALKPHOS 59 06/28/2015 2006   ALKPHOS 55 03/31/2015 0831   AST 21 06/28/2015 2006   AST 20 03/31/2015 0831   ALT 17 06/28/2015 2006   ALT 17 03/31/2015 0831   BILITOT 0.8 06/28/2015 2006   BILITOT 0.53 03/31/2015 0831       No results found for: LABCA2  No components found for: WYOVZ858  No results for input(s): INR in the last 168 hours.  Urinalysis    Component Value Date/Time   COLORURINE YELLOW 06/29/2015 0000   APPEARANCEUR CLOUDY* 06/29/2015 0000   LABSPEC 1.031* 06/29/2015 0000   PHURINE 5.0 06/29/2015 0000   GLUCOSEU >1000* 06/29/2015 0000   GLUCOSEU NEGATIVE 04/29/2015 1010   HGBUR NEGATIVE 06/29/2015 0000   BILIRUBINUR NEGATIVE 06/29/2015 0000   KETONESUR NEGATIVE 06/29/2015 0000   PROTEINUR NEGATIVE 06/29/2015 0000   UROBILINOGEN 0.2 04/29/2015 1010   NITRITE NEGATIVE 06/29/2015 0000   LEUKOCYTESUR NEGATIVE 06/29/2015 0000      ELIGIBLE FOR AVAILABLE RESEARCH PROTOCOL: no  STUDIES: Ct Abdomen Pelvis W Contrast  06/29/2015  CLINICAL DATA:  Initial evaluation for acute abdominal pain, nausea, vomiting. EXAM: CT ABDOMEN AND PELVIS WITH CONTRAST TECHNIQUE: Multidetector CT imaging of the abdomen and pelvis was performed using the standard protocol following bolus administration of intravenous contrast. CONTRAST:  159m ISOVUE-300 IOPAMIDOL (ISOVUE-300) INJECTION 61% COMPARISON:  None. FINDINGS: Visualized lung bases are clear. No pleural  or pericardial effusion. Liver demonstrates a normal contrast enhanced appearance. Gallbladder normal. No biliary dilatation. Spleen, adrenal glands, and pancreas demonstrate a normal contrast enhanced appearance. Kidneys are equal in size with symmetric enhancement. No nephrolithiasis, hydronephrosis, or focal enhancing renal mass. Stomach grossly unremarkable, although evaluation somewhat limited by of motion artifact. No evidence for bowel obstruction. No  abnormal wall thickening, mucosal enhancement, or inflammatory fat stranding seen about the bowels. Appendix not visualize, consistent with history prior appendectomy. Bladder decompressed without definite abnormality. Uterus is absent. Right ovary unremarkable. Left ovary not discretely identified. No free air identified. Possible trace free fluid within the pelvis (series 2, image 67). This is of doubtful clinical significance. Few mildly prominent mesenteric lymph nodes measuring up to 1 cm present at the root of the mesenteric. Minimal hazy mesenteric stranding at the root of the mesentery as well. Normal intravascular enhancement seen throughout the intra-abdominal aorta and its branch vessels. Minimal atheromatous plaque about the aortic bifurcation. No aneurysm. No acute osseus abnormality. No worrisome lytic or blastic osseous lesions. Degenerative spondylolysis at L4-5 and L5-S1. IMPRESSION: 1. Mild hazy stranding at the root of the mesentery with a few prominent mesenteric lymph nodes measuring to the upper limits of normal at 1 cm. These findings are nonspecific, but may be reactive in nature, most commonly to an underlying occult enteritis. 2. No other acute intra-abdominal or pelvic process identified. 3. Status post appendectomy and hysterectomy. Electronically Signed   By: Jeannine Boga M.D.   On: 06/29/2015 03:54    ASSESSMENT: 70 y.o. [place] woman status post right breast upper outer quadrant biopsy 03/22/2015 for a clinical T1CN0,  stage IA invasive ductal carcinoma, grade 1, estrogen and progesterone receptor positive, HER-2 not amplified, with an MIB-1 of 15%.  (1)  Status post right lumpectomy and sentinel lymph node sampling 04/16/2015 for a pT1c pN0, stage IA  Invasive ductal carcinoma, grade 1, repeat HER-2 again negative  (2) Oncotype DX  Score of 12 predicts a risk of outside the breast recurrence of 8% within 10 years if the patient's only systemic therapy is tamoxifen for 5 years. It also predicts no benefit from chemotherapy.  (3) adjuvant radiation completed 07/19/2015   (4) to start anastrozole 10/08/2015  PLAN:  [facility] has now completed her local treatment for her breast cancer, namely radiation and surgery. She is ready to start systemic therapy.  She understands the point of systemic therapy is had breast cancer may have already spread to other different parts of the body before she had her surgery. We would not be able to find that with scans and in fact she recently had a CT scan of the abdomen which was fine. That doesn't mean she does not have occult disease. The possibility of that is real and probably in the 116-18% range.  If she took anti-estrogens she can cut that risk in half. We discussed anastrozole in particular and she has a good understanding of the possible toxicities, side effects and complications of that agent. I went ahead and placed a prescription in for her but requested she not started until 10/08/2015 since I think she is can it be still recovering from radiation before that date.  I do think she can start venlafaxine now. Were going to try 37.5 mg to see if that helps her hot flashes. If not she will let us know and we will double the dose.  She will see me again in October. If she is tolerating the anastrozole well the plan will be to continue that for 5 years. Otherwise we will switch to tamoxifen.  :Amanda Cruel, MD   07/26/2015 2:07 PM Medical Oncology and Hematology [facility] [address] [place], [place] [address] Tel. [phone]    Fax. [phone]

## 2015-07-29 NOTE — Progress Notes (Incomplete)
°  Radiation Oncology         [phone] ________________________________  Name: Amanda [name] MRN: [id number]  Date: 07/19/2015  DOB: Aug 21, 1945  End of Treatment Note  Diagnosis:   ***     Indication for treatment:  ***       Radiation treatment dates:   06/14/2015-07/19/2015  Site/dose:   ***  Beams/energy:   ***  Narrative: The patient tolerated radiation treatment relatively well.   ***  Plan: The patient has completed radiation treatment. The patient will return to radiation oncology clinic for routine followup in one month. I advised them to call or return sooner if they have any questions or concerns related to their recovery or treatment.  -----------------------------------  Eppie Gibson, MD

## 2015-08-02 ENCOUNTER — Telehealth: Payer: Self-pay | Admitting: *Deleted

## 2015-08-02 NOTE — Telephone Encounter (Signed)
"  I'm calling about a medicine prescribed for me.  Please call [phone]."  Called leaving message on voicemail requesting return call.  Awaiting return call from patient.  Anastrozole prescribed 07-26-2015 with start date of 10-08-2015

## 2015-08-16 ENCOUNTER — Other Ambulatory Visit (INDEPENDENT_AMBULATORY_CARE_PROVIDER_SITE_OTHER): Payer: Medicare Other

## 2015-08-16 DIAGNOSIS — E1165 Type 2 diabetes mellitus with hyperglycemia: Secondary | ICD-10-CM

## 2015-08-16 DIAGNOSIS — E785 Hyperlipidemia, unspecified: Secondary | ICD-10-CM | POA: Diagnosis not present

## 2015-08-16 LAB — COMPREHENSIVE METABOLIC PANEL
ALT: 12 U/L (ref 0–35)
AST: 16 U/L (ref 0–37)
Albumin: 3.9 g/dL (ref 3.5–5.2)
Alkaline Phosphatase: 56 U/L (ref 39–117)
BILIRUBIN TOTAL: 0.4 mg/dL (ref 0.2–1.2)
BUN: 14 mg/dL (ref 6–23)
CO2: 28 meq/L (ref 19–32)
CREATININE: 0.83 mg/dL (ref 0.40–1.20)
Calcium: 10.1 mg/dL (ref 8.4–10.5)
Chloride: 105 mEq/L (ref 96–112)
GFR: 87.36 mL/min (ref >60.00)
GLUCOSE: 97 mg/dL (ref 70–99)
Potassium: 4.4 mEq/L (ref 3.5–5.1)
Sodium: 138 mEq/L (ref 135–145)
Total Protein: 7 g/dL (ref 6.0–8.3)

## 2015-08-16 LAB — LIPID PANEL
CHOL/HDL RATIO: 5
Cholesterol: 276 mg/dL — ABNORMAL HIGH (ref 0–200)
HDL: 61.2 mg/dL (ref >39.00)
LDL Cholesterol: 194 mg/dL — ABNORMAL HIGH (ref 0–99)
NONHDL: 214.35
Triglycerides: 103 mg/dL (ref 0.0–149.0)
VLDL: 20.6 mg/dL (ref 0.0–40.0)

## 2015-08-16 LAB — HEMOGLOBIN A1C: Hgb A1c MFr Bld: 6.6 % — ABNORMAL HIGH (ref 4.6–6.5)

## 2015-08-20 ENCOUNTER — Telehealth: Payer: Self-pay | Admitting: Endocrinology

## 2015-08-20 ENCOUNTER — Ambulatory Visit (INDEPENDENT_AMBULATORY_CARE_PROVIDER_SITE_OTHER): Payer: Medicare Other | Admitting: Endocrinology

## 2015-08-20 ENCOUNTER — Encounter: Payer: Self-pay | Admitting: Endocrinology

## 2015-08-20 VITALS — BP 120/62 | HR 70 | Wt 182.0 lb

## 2015-08-20 DIAGNOSIS — E785 Hyperlipidemia, unspecified: Secondary | ICD-10-CM | POA: Diagnosis not present

## 2015-08-20 DIAGNOSIS — E1165 Type 2 diabetes mellitus with hyperglycemia: Secondary | ICD-10-CM | POA: Diagnosis not present

## 2015-08-20 MED ORDER — PITAVASTATIN CALCIUM 2 MG PO TABS
ORAL_TABLET | ORAL | Status: DC
Start: 2015-08-20 — End: 2015-10-20

## 2015-08-20 NOTE — Telephone Encounter (Signed)
Pharmacy needed to clarification on the dosage for the medication listed below. Pharmacy advised pt is to take one tablet daily.

## 2015-08-20 NOTE — Progress Notes (Signed)
Amanda [name] is an 70 y.o. female.             Reason for Appointment: Diabetes follow-up   History of Present Illness   Diagnosis: Type 2 DIABETES MELITUS     Previous history: She has had difficulty with blood sugar control initially because of limited tolerability with metformin and difficulty with weight loss. She did very well with starting Victoza which has helped her blood sugar control as well as some improvement in her obesity Her insurance company did not prefer Victoza and she was switched to Trulicity Subsequently her glipizide was stopped  Recent history:     Her blood sugars are better controlled with A1c 6.6 compared to 7.4 previously She was started on Invokana 100 mg daily in addition to her Trulicity A999333 mg  Current blood sugar patterns, management and problems identified:  She has checked a few blood sugars at home at different times, they are mostly near normal  Overall blood sugars are averaging lower in the mornings  She has no side effects from Invokana   Also been taking Trulicity A999333 weekly with no side effects  She has more recently been able to exercise as regularly   She thinks she is still trying to watch her diet     Oral hypoglycemic drugs:   Invokana 100 mg daily       Side effects from medications: Diarrhea from metformin.  Nausea from 1.5 mg Trulicity         Monitors blood glucose:  less than Once a day.    Glucometer: One Touch ultra 2             Blood Glucose readings 100-140 at various times, only a couple readings in the evenings after evening meal          Physical activity: exercise: walking on treadmill 3-4/7 days upto 60 min            Wt Readings from Last 3 Encounters:  08/20/15 182 lb (82.555 kg)  07/26/15 184 lb 1.6 oz (83.507 kg)  07/19/15 182 lb (82.555 kg)    LABS:  Lab Results  Component Value Date   HGBA1C 6.6* 08/16/2015   HGBA1C 7.4* 04/29/2015   HGBA1C 6.5 11/02/2014   Lab Results  Component Value  Date   MICROALBUR <0.7 04/01/2014   LDLCALC 194* 08/16/2015   CREATININE 0.83 08/16/2015     Lab on 08/16/2015  Component Date Value Ref Range Status  . Hgb A1c MFr Bld 08/16/2015 6.6* 4.6 - 6.5 % Final   Glycemic Control Guidelines for People with Diabetes:Non Diabetic:  <6%Goal of Therapy: <7%Additional Action Suggested:  >8%   . Sodium 08/16/2015 138  135 - 145 mEq/L Final  . Potassium 08/16/2015 4.4  3.5 - 5.1 mEq/L Final  . Chloride 08/16/2015 105  96 - 112 mEq/L Final  . CO2 08/16/2015 28  19 - 32 mEq/L Final  . Glucose, Bld 08/16/2015 97  70 - 99 mg/dL Final  . BUN 08/16/2015 14  6 - 23 mg/dL Final  . Creatinine, Ser 08/16/2015 0.83  0.40 - 1.20 mg/dL Final  . Total Bilirubin 08/16/2015 0.4  0.2 - 1.2 mg/dL Final  . Alkaline Phosphatase 08/16/2015 56  39 - 117 U/L Final  . AST 08/16/2015 16  0 - 37 U/L Final  . ALT 08/16/2015 12  0 - 35 U/L Final  . Total Protein 08/16/2015 7.0  6.0 - 8.3 g/dL Final  . Albumin 08/16/2015  3.9  3.5 - 5.2 g/dL Final  . Calcium 08/16/2015 10.1  8.4 - 10.5 mg/dL Final  . GFR 08/16/2015 87.36  >60.00 mL/min Final  . Cholesterol 08/16/2015 276* 0 - 200 mg/dL Final   ATP III Classification       Desirable:  < 200 mg/dL               Borderline High:  200 - 239 mg/dL          High:  > = 240 mg/dL  . Triglycerides 08/16/2015 103.0  0.0 - 149.0 mg/dL Final   Normal:  <150 mg/dLBorderline High:  150 - 199 mg/dL  . HDL 08/16/2015 61.20  >39.00 mg/dL Final  . VLDL 08/16/2015 20.6  0.0 - 40.0 mg/dL Final  . LDL Cholesterol 08/16/2015 194* 0 - 99 mg/dL Final  . Total CHOL/HDL Ratio 08/16/2015 5   Final                  Men          Women1/2 Average Risk     3.4          3.3Average Risk          5.0          4.42X Average Risk          9.6          7.13X Average Risk          15.0          11.0                      . NonHDL 08/16/2015 214.35   Final   NOTE:  Non-HDL goal should be 30 mg/dL higher than patient's LDL goal (i.e. LDL goal of < 70 mg/dL, would  have non-HDL goal of < 100 mg/dL)      Medication List       This list is accurate as of: 08/20/15 11:59 PM.  Always use your most recent med list.               anastrozole 1 MG tablet  Commonly known as:  ARIMIDEX  Take 1 tablet (1 mg total) by mouth daily.     aspirin 81 MG tablet  Take 81 mg by mouth daily.     canagliflozin 100 MG Tabs tablet  Commonly known as:  INVOKANA  1 tablet before breakfast     Dulaglutide 0.75 MG/0.5ML Sopn  Commonly known as:  TRULICITY  INJECT A999333 MG UNDER THE SKIN ONCE A WEEK     fexofenadine 180 MG tablet  Commonly known as:  ALLEGRA  Take 180 mg by mouth daily.     glucose blood test strip  Commonly known as:  ONE TOUCH ULTRA TEST  USE TO TEST BLOOD SUGAR ONE TIME DAILY AS INSTRUCTED Dx code E11.9     montelukast 10 MG tablet  Commonly known as:  SINGULAIR  Take 10 mg by mouth at bedtime. Reported on 08/20/2015     Pitavastatin Calcium 2 MG Tabs  2mg      ranitidine 150 MG tablet  Commonly known as:  ZANTAC  Take 150 mg by mouth 2 (two) times daily.     venlafaxine XR 37.5 MG 24 hr capsule  Commonly known as:  EFFEXOR-XR  Take 1 capsule (37.5 mg total) by mouth daily with breakfast.        Allergies:  Allergies  Allergen Reactions  .  Meperidine Hcl     REACTION: rash  . Statins Other (See Comments)    Muscle pain. Pt able to take Crestor but not everyday.    Past Medical History  Diagnosis Date  . Diabetes mellitus without complication ([place])   . GERD (gastroesophageal reflux disease)     Pt takes OTC when needed.  . Arthritis   . Carpal tunnel syndrome   . 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 Hot flashes   . Breast cancer [facility])     Past Surgical History  Procedure Laterality Date  . Shoulder surgery Right   . Appendectomy    . Abdominal hysterectomy    . Tonsillectomy    . Eye surgery Bilateral     Cataracts removed  . Colonoscopy    .  Esophagogastroduodenoscopy    . 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 Total knee arthroplasty Right 07/18/2013    Procedure: TOTAL KNEE ARTHROPLASTY;  Surgeon: Alta Corning, MD;  Location: [facility];  Service: Orthopedics;  Laterality: Right;  . Joint replacement    . Radioactive seed guided mastectomy with axillary sentinel lymph node biopsy Right 04/16/2015    Procedure: RADIOACTIVE SEED GUIDED PARTIAL MASTECTOMY WITH AXILLARY SENTINEL LYMPH NODE BIOPSY;  Surgeon: Alphonsa Overall, MD;  Location: [facility];  Service: General;  Laterality: Right;    No family history on file.  Social History:  reports that she has never smoked. She has never used smokeless tobacco. She reports that she drinks alcohol. She reports that she does not use illicit drugs.  Review of Systems:  She is now on HCTZ presumably for hypertension  Lipids:  She in on Crestor from PCP, taking 3/7.   She thinks she will get joint pains if she takes it every day but more recently had tried to take it daily Her lipids are not controlled and she now says that she has not taken the medication for a couple of weeks  She was told to start Livalo but she still did not get this  Her LDL is about the same as before    Lab Results  Component Value Date   CHOL 276* 08/16/2015   HDL 61.20 08/16/2015   LDLCALC 194* 08/16/2015   LDLDIRECT 120.1 09/30/2013   TRIG 103.0 08/16/2015   CHOLHDL 5 08/16/2015    Foot exam done in 3/17, normal exam    Examination:     BP 120/62 mmHg  Pulse 70  Wt 182 lb (82.555 kg)  SpO2 95%  Body mass index is 33.28 kg/(m^2).    ASSESSMENT/ PLAN:  Diabetes type 2  See history of present illness for detailed discussion of current diabetes management, blood sugar patterns and problems identified With continuing Invokana in combination with Trulicity she has done well with A1c below 7% Blood sugars are mostly near normal at home She is doing well with exercise  although weight has not come down anymore  She will continue the same regimen and come back in 3 months for another A1c  HYPERCHOLESTEROLEMIA:   She has not had control with having baseline LDL of around 200 She agrees to try Livalo now since she is finishing up her Crestor She may continue Livalo if she is tolerating it better although does not appear to have any specific side effects from Crestor which she had taken regularly until recently  Also may need Zetia in addition  There are  no Patient Instructions on file for this visit.   Jupiter Boys 08/21/2015, 3:37 PM

## 2015-08-20 NOTE — Telephone Encounter (Signed)
[facility] calling about pitazastatin 2 mg they need clarification # [phone]

## 2015-08-27 ENCOUNTER — Other Ambulatory Visit: Payer: Self-pay | Admitting: *Deleted

## 2015-08-27 DIAGNOSIS — C50411 Malignant neoplasm of upper-outer quadrant of right female breast: Secondary | ICD-10-CM

## 2015-08-28 NOTE — Progress Notes (Signed)
  Radiation Oncology         [phone] ________________________________  Name: Amanda [name] MRN: [id number]  Date: 07/19/2015  DOB: May 05, 1945  End of Treatment Note  Diagnosis:   Right-sided breast cancer     Indication for treatment:  Curative       Radiation treatment dates:   06/14/2015 through 07/19/2015  Site/dose:   The patient received a dose of 50 Gy in 25 fractions to the breast using whole-breast tangent fields. This was delivered using a 3-D conformal technique.    Narrative: The patient tolerated radiation treatment relatively well.   The patient had some expected skin irritation as she progressed during treatment.   Plan: The patient has completed radiation treatment. The patient will return to radiation oncology clinic for routine followup in one month. I advised the patient to call or return sooner if they have any questions or concerns related to their recovery or treatment. ________________________________  Jodelle Gross, M.D., Ph.D.

## 2015-08-30 ENCOUNTER — Telehealth: Payer: Self-pay | Admitting: Adult Health

## 2015-08-30 NOTE — Telephone Encounter (Signed)
Called patient to confirm appointment. Left message. Appointment letter and schedule mailed. Maria F. °

## 2015-08-30 NOTE — Telephone Encounter (Signed)
Pt returned call. Explained survivorship appt. Pt requested earlier than 1400. Message given to [facility] to r/s earlier in day and to call pt with new time.

## 2015-09-02 ENCOUNTER — Ambulatory Visit: Payer: Self-pay | Admitting: Radiation Oncology

## 2015-09-03 ENCOUNTER — Ambulatory Visit: Payer: Self-pay | Admitting: Radiation Oncology

## 2015-09-08 ENCOUNTER — Ambulatory Visit
Admission: RE | Admit: 2015-09-08 | Discharge: 2015-09-08 | Disposition: A | Discharge status: Home / Self Care | Payer: Medicare Other | Source: Ambulatory Visit | Attending: Radiation Oncology | Admitting: Radiation Oncology

## 2015-09-08 DIAGNOSIS — C50411 Malignant neoplasm of upper-outer quadrant of right female breast: Secondary | ICD-10-CM | POA: Insufficient documentation

## 2015-09-08 NOTE — Progress Notes (Signed)
Ms. Amanda [name]  is here for a one month  follow up visit for breast cancer to right breast.  Skin status: Mild hyperpigmentation under under the inframammary fold. Lotion being used: Lotion with vit E Have you seen your medical oncologist? Date If not ,when is appointment 11-29-15 Dr. Lurline Del When is yiur next mammogram? Has it been scheduled ? : Not scheduled yet ER+,have started AI or Tamoxifen? If not, why? Will  start Anastrozole in September or October of this year. Discuss survivorship appointment: 10-29-15 Mike Craze, PA-C Offer referral reading material for Survivorship, [employer] and [facility] given 07-19-15 Appetite: Good Pain: None Arm mobility:Able to raise right arm without discomfort. Fatigue:Having fatigue in the mostly in the afternoon. BP (!) 146/64 (BP Location: Left Arm, Patient Position: Sitting, Cuff Size: Normal)   Pulse 78   Temp 97.8 F (36.6 C) (Oral)   Resp 18   Ht 5\' 2"  (1.575 m)   Wt 182 lb 8 oz (82.8 kg)   SpO2 100%   BMI 33.38 kg/m

## 2015-09-08 NOTE — Progress Notes (Signed)
Radiation Oncology         [phone] ________________________________  Name: Amanda [name] MRN: [id number]  Date: 09/08/2015  DOB: [birth year]  Follow-Up Visit Note  CC: Gara Kroner, MD  Alphonsa Overall, MD  Diagnosis and Prior Radiotherapy:    Stage IA Breast cancer of the upper-outer quadrant of the right female breast.    ICD-9-CM ICD-10-CM   1. Breast cancer of upper-outer quadrant of right female breast ([place]) 174.4 C50.411      Narrative:  The patient returns today for routine follow-up.  She completed treatment on 07/28/2015 and received 50Gy to the right breast. The patient reports that she had a burning sensation under her breast after her radiation, but reports that this has improved with topical medication.  She reports that she is using lotion with vitamin E.               ALLERGIES:  is allergic to meperidine hcl and statins.  Meds: Current Outpatient Prescriptions  Medication Sig Dispense Refill  . aspirin 81 MG tablet Take 81 mg by mouth daily.    . canagliflozin (INVOKANA) 100 MG TABS tablet 1 tablet before breakfast 30 tablet 3  . Dulaglutide (TRULICITY) A999333 0000000 SOPN INJECT 0.75 MG UNDER THE SKIN ONCE A WEEK 6 mL 1  . fexofenadine (ALLEGRA) 180 MG tablet Take 180 mg by mouth daily.    Marland Kitchen glucose blood (ONE TOUCH ULTRA TEST) test strip USE TO TEST BLOOD SUGAR ONE TIME DAILY AS INSTRUCTED Dx code E11.9 100 each 1  . Pitavastatin Calcium 2 MG TABS 2mg  30 tablet 0  . ranitidine (ZANTAC) 150 MG tablet Take 150 mg by mouth 2 (two) times daily.    Marland Kitchen venlafaxine XR (EFFEXOR-XR) 37.5 MG 24 hr capsule Take 1 capsule (37.5 mg total) by mouth daily with breakfast. 30 capsule 4  . anastrozole (ARIMIDEX) 1 MG tablet Take 1 tablet (1 mg total) by mouth daily. (Patient not taking: Reported on 09/08/2015) 90 tablet 4  . montelukast (SINGULAIR) 10 MG tablet Take 10 mg by mouth at bedtime. Reported on 08/20/2015     No current facility-administered medications for this encounter.       Physical Findings: The patient is in no acute distress. Patient is alert and oriented. Wt Readings from Last 3 Encounters:  09/08/15 182 lb 8 oz (82.8 kg)  08/20/15 182 lb (82.6 kg)  07/26/15 184 lb 1.6 oz (83.5 kg)    height is 5\' 2"  (1.575 m) and weight is 182 lb 8 oz (82.8 kg). Her oral temperature is 97.8 F (36.6 C). Her blood pressure is 146/64 (abnormal) and her pulse is 78. Her respiration is 18 and oxygen saturation is 100%. .   Faint residual hyperpigmentation over the right breast, skin is intact.    Lab Findings: Lab Results  Component Value Date   WBC 6.0 06/28/2015   HGB 13.0 06/28/2015   HCT 39.0 06/28/2015   MCV 94.9 06/28/2015   PLT 351 06/28/2015    No results found for: TSH  Radiographic Findings: No results found.  Impression/Plan:   The patient has healed well from radiation.  I encouraged her to continue with yearly mammography and followup with medical oncology. I will see her back on an as-needed basis. I have encouraged her to call if she has any issues or concerns in the future. I wished her the very best. I reminded her that needs to start her anastrozole on Sept 1 per med/onc notes.  _____________________________________   Eppie Gibson, MD   This document serves as a record of services personally performed by Eppie Gibson , MD. It was created on her behalf by Truddie Hidden, a trained medical scribe. The creation of this record is based on the scribe's personal observations and the provider's statements to them. This document has been checked and approved by the attending provider.

## 2015-10-20 ENCOUNTER — Other Ambulatory Visit: Payer: Self-pay | Admitting: *Deleted

## 2015-10-20 MED ORDER — PITAVASTATIN CALCIUM 2 MG PO TABS: ORAL_TABLET | ORAL | 0 refills | Status: DC

## 2015-10-29 ENCOUNTER — Encounter: Payer: Self-pay | Admitting: Adult Health

## 2015-10-29 ENCOUNTER — Telehealth: Payer: Self-pay | Admitting: *Deleted

## 2015-10-29 ENCOUNTER — Ambulatory Visit (HOSPITAL_BASED_OUTPATIENT_CLINIC_OR_DEPARTMENT_OTHER): Payer: Medicare Other | Admitting: Adult Health

## 2015-10-29 VITALS — BP 147/67 | HR 75 | Temp 99.4°F | Resp 18 | Wt 181.4 lb

## 2015-10-29 DIAGNOSIS — Z79811 Long term (current) use of aromatase inhibitors: Secondary | ICD-10-CM

## 2015-10-29 DIAGNOSIS — N951 Menopausal and female climacteric states: Secondary | ICD-10-CM | POA: Diagnosis not present

## 2015-10-29 DIAGNOSIS — L271 Localized skin eruption due to drugs and medicaments taken internally: Secondary | ICD-10-CM | POA: Diagnosis not present

## 2015-10-29 DIAGNOSIS — C50411 Malignant neoplasm of upper-outer quadrant of right female breast: Secondary | ICD-10-CM | POA: Diagnosis not present

## 2015-10-29 DIAGNOSIS — L282 Other prurigo: Secondary | ICD-10-CM

## 2015-10-29 MED ORDER — HYDROXYZINE HCL 25 MG PO TABS: 25.0000 mg | ORAL_TABLET | Freq: Three times a day (TID) | ORAL | 1 refills | Status: DC | PRN

## 2015-10-29 MED ORDER — HYDROXYZINE HCL 25 MG PO TABS: 25.0000 mg | ORAL_TABLET | Freq: Three times a day (TID) | ORAL | Status: DC | PRN

## 2015-10-29 NOTE — Patient Instructions (Signed)
Thank you for coming in to see me today. I enjoyed meeting you!  -Stop the Anastrozole today.  We will give you a call sometime next week to see how the rash is doing.  -Take the Hydroxyzine (Atarax) for the itching as prescribed.   Feel free to call me with any questions or concerns! Mike Craze, NP [place] [phone]

## 2015-10-29 NOTE — Telephone Encounter (Signed)
Patient called stating that she is breaking out with small bumps that turn into sores on her fingers, hands, legs, and hair. Patient states that these bumps itch. Patient states that she has started on this arimidex at the beginning of September. Patient will be here today 9/22 for a survivorship appointment with Mike Craze NP.

## 2015-10-29 NOTE — Progress Notes (Signed)
CLINIC:  Survivorship   REASON FOR VISIT:  Routine follow-up post-treatment for a recent history of breast cancer.  BRIEF ONCOLOGIC HISTORY:    Breast cancer of upper-outer quadrant of right female breast ([place])   05/28/2014 Imaging    DEXA scan [facility]): Normal      03/22/2015 Initial Biopsy    (R) breast needle biopsy ([facility]): IDC, grade 1 with papillary features; DCIS.  ER+ (95%), PR+ (80%), Ki67 15%, HER2 neg (ratio 1.58).       03/24/2015 Initial Diagnosis    Breast cancer of upper-outer quadrant of right female breast ([place])     04/16/2015 Surgery    (R) lumpectomy with SLNB Lucia Gaskins): IDC with papillary features & associated extracellular mucin, grade 1, 1.3 cm, associated low-grade DCIS and calcs; 0/1 SLN, HER2 repeated and remains negative (ratio 1.64).   pT1c, pN0: Stage IA       04/16/2015 Oncotype testing    RS: 12; 8% ROR (low-risk)      06/14/2015 - 07/19/2015 Radiation Therapy    Adjuvant breast radiation Pablo Ledger). Right breast, 50 Gy at 25 fractions.       10/2015 -  Anti-estrogen oral therapy    Anastrazole 1 mg daily. Planned duration of therapy: 5 years.        INTERVAL HISTORY:  Amanda [name] presents to the Survivorship Clinic today for our initial meeting to review her survivorship care plan detailing her treatment course for breast cancer, as well as monitoring long-term side effects of that treatment, education regarding health maintenance, screening, and overall wellness and health promotion.     Overall, Amanda [name] reports feeling quite well since completing her radiation therapy approximately 3 months ago.  She started the anastrozole on 10/08/15 and for the past 2 weeks, she has broken out in a rash.  She has had a generalized rash to her right hand, behind her ear, her leg, and now under her breast.  She said it begins as a single small bump (almost like a bullae as she describes it), is extremely itchy, and then it will open, dry up, and heal.  This has been  going on for the past 2 weeks. "It seems like I get a new spot pop up overnight every night."  She has been eliminating things from her routine to rule out other causes (no new detergents, soaps, lotions).  She started a new cholesterol medication about 2 months ago and has tolerated it well without any rash.  The only new medication is the anastrozole and she thinks it is the cause.  The pruritis has been affecting her ability to sleep. She has tried benadryl lotion and hydrocortisone creams, which are only minimally effective.  "This has been driving me crazy and I can't take it anymore."   She also recently stopped taking the Effexor, previously prescribed to help with hot flashes.  She said the Effexor made her hot flashes exponentially worse, where she was having drenching sweats during the day and night.  She weaned herself off of the Effexor and the hot flashes have improved.  She has no desire to try the Effexor again at a different dose; "That's why I didn't call Dr. Virgie Dad office back after the first month because I knew I didn't want to take that medication again."    She has some associated fatigue from not sleeping well for the past month or so. She is working on getting back to exercising on her treadmill and walking more.  She just has been so frustrated with the hot flashes and rash in the past few weeks that she has not felt like doing any exercise.    Aside from these 2 main concerns, she is doing well.    REVIEW OF SYSTEMS:  Per HPI.  GU: Denies vaginal bleeding or discharge. (+) dryness.  Breast: Denies any new nodularity, masses, tenderness, nipple changes, or nipple discharge.   A 14-point review of systems was completed and was negative, except as noted above.   ONCOLOGY TREATMENT TEAM:  1. Surgeon:  Dr. Lucia Gaskins at [facility] 2. Medical Oncologist: Dr. Jana Hakim  3. Radiation Oncologist: Dr. Pablo Ledger    PAST MEDICAL/SURGICAL HISTORY:  Past Medical  History:  Diagnosis Date  . Arthritis   . Breast cancer ([place])   . Breast cancer of upper-outer quadrant of right female breast ([place]) 03/24/2015  . Carpal tunnel syndrome   . Diabetes mellitus without complication ([place])   . GERD (gastroesophageal reflux disease)    Pt takes OTC when needed.  Marland Kitchen Hot flashes    Past Surgical History:  Procedure Laterality Date  . ABDOMINAL HYSTERECTOMY    . APPENDECTOMY    . COLONOSCOPY    . ESOPHAGOGASTRODUODENOSCOPY    . EYE SURGERY Bilateral    Cataracts removed  . JOINT REPLACEMENT    . RADIOACTIVE SEED GUIDED MASTECTOMY WITH AXILLARY SENTINEL LYMPH NODE BIOPSY Right 04/16/2015   Procedure: RADIOACTIVE SEED GUIDED PARTIAL MASTECTOMY WITH AXILLARY SENTINEL LYMPH NODE BIOPSY;  Surgeon: Alphonsa Overall, MD;  Location: [facility];  Service: General;  Laterality: Right;  . SHOULDER SURGERY Right   . TONSILLECTOMY    . TOTAL KNEE ARTHROPLASTY Right 07/18/2013   DR GRAVES  . TOTAL KNEE ARTHROPLASTY Right 07/18/2013   Procedure: TOTAL KNEE ARTHROPLASTY;  Surgeon: Alta Corning, MD;  Location: [facility];  Service: Orthopedics;  Laterality: Right;     ALLERGIES:  Allergies  Allergen Reactions  . Meperidine Hcl     REACTION: rash  . Statins Other (See Comments)    Muscle pain. Pt able to take Crestor but not everyday.     CURRENT MEDICATIONS:  Outpatient Encounter Prescriptions as of 10/29/2015  Medication Sig  . anastrozole (ARIMIDEX) 1 MG tablet Take 1 tablet (1 mg total) by mouth daily.  Marland Kitchen aspirin 81 MG tablet Take 81 mg by mouth daily.  . canagliflozin (INVOKANA) 100 MG TABS tablet 1 tablet before breakfast  . Dulaglutide (TRULICITY) 0.03 BC/4.8GQ SOPN INJECT 0.75 MG UNDER THE SKIN ONCE A WEEK  . fexofenadine (ALLEGRA) 180 MG tablet Take 180 mg by mouth daily.  Marland Kitchen glucose blood (ONE TOUCH ULTRA TEST) test strip USE TO TEST BLOOD SUGAR ONE TIME DAILY AS INSTRUCTED Dx code E11.9  . montelukast (SINGULAIR) 10 MG tablet Take 10 mg by mouth  at bedtime. Reported on 08/20/2015  . Pitavastatin Calcium 2 MG TABS 52m  . ranitidine (ZANTAC) 150 MG tablet Take 150 mg by mouth 2 (two) times daily.  . hydrOXYzine (ATARAX/VISTARIL) 25 MG tablet Take 1 tablet (25 mg total) by mouth 3 (three) times daily as needed for itching.  . venlafaxine XR (EFFEXOR-XR) 37.5 MG 24 hr capsule Take 1 capsule (37.5 mg total) by mouth daily with breakfast. (Patient not taking: Reported on 10/29/2015)  . [DISCONTINUED] hydrOXYzine (ATARAX/VISTARIL) tablet 25 mg    No facility-administered encounter medications on file as of 10/29/2015.      ONCOLOGIC FAMILY HISTORY:  No family history on file.   GENETIC  COUNSELING/TESTING: None.  SOCIAL HISTORY:  Amanda [name] is divorced and lives in [place], [place].  She has 2 children, 1 son and 1 daughter. 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 She denies any current tobacco, alcohol, or illicit drug use.     PHYSICAL EXAMINATION:  Vital Signs:   Vitals:   10/29/15 1400  BP: (!) 147/67  Pulse: 75  Resp: 18  Temp: 99.4 F (37.4 C)   Filed Weights   10/29/15 1400  Weight: 181 lb 6.4 oz (82.3 kg)   General: Well-nourished, well-appearing female in no acute distress.  She is unaccompanied today.   HEENT: Head is normocephalic.  Pupils equal and reactive to light. Conjunctivae clear without exudate.  Sclerae anicteric. Oral mucosa is pink, moist.  Oropharynx is pink without lesions or erythema.  Lymph: No cervical, supraclavicular, or infraclavicular lymphadenopathy noted on palpation.  Cardiovascular: Regular rate and rhythm.Marland Kitchen Respiratory: Clear to auscultation bilaterally. Chest expansion symmetric; breathing non-labored.  GI: Abdomen soft and round; non-tender, non-distended. Bowel sounds normoactive.  GU: Deferred.  Neuro: No focal deficits. Steady gait.  Psych: Mood and affect normal and appropriate for situation.  Extremities: No edema. Skin: Warm and dry. Small, raised  vesicle-type lesions in no particular pattern to her right 5th finger, right back of hand, behind her right ear at hairline, left lower leg, and under right breast.  Lesions do not follow dermatome.   LABORATORY DATA:  None for this visit.  DIAGNOSTIC IMAGING:  None for this visit.      ASSESSMENT AND PLAN:  Ms.. Blackson is a pleasant 70 y.o. female with Stage IA right breast invasive ductal carcinoma, ER+/PR+/HER2-, diagnosed in 03/1015, treated with lumpectomy, adjuvant radiation therapy, and anti-estrogen therapy with Anastrazole beginning in 10/2015.  She presents to the Survivorship Clinic for our initial meeting and routine follow-up post-completion of treatment for breast cancer.    1. Stage IA right breast cancer:  Ms. Gillock is continuing to recover from definitive treatment for breast cancer. She will follow-up with her medical oncologist, Dr. Jana Hakim, in 11/2015 with history and physical exam per surveillance protocol.  She will hold her anastrozole for now, given possible drug rash (see #2 below).  Today, a comprehensive survivorship care plan and treatment summary was reviewed with the patient today detailing her breast cancer diagnosis, treatment course, potential late/long-term effects of treatment, appropriate follow-up care with recommendations for the future, and patient education resources.  A copy of this summary, along with a letter will be sent to the patient's primary care provider via mail/fax/In Basket message after today's visit.    2. Pruritic rash, possibly d/t Anastrozole: I let Ms. Helfrich know that it is not unheard of for patients to experience rash with the addition of new mediations, particularly anastrozole.  According to Up-to-Date, anastrozole carries a 6-10% risk of rash.  It appears that Ms. Bark may be falling into that group.  The rash however does not look like a typical "drug rash" as her rash is more vesicular. However, it does not follow a dermatome, there  is no pain, it is in several locations, and it crosses the midline ( ruling out a herpes zoster infection).  I explained that we could stop the anastrozole for a few weeks to see if the rash resolves. If it does, then we will obviously know that the anastrozole was the cause.  If the rash does not resolve and she continues to have eruptions of rash, then we  will place a referral to dermatology.  She agrees with this plan.  I recommended that she stop the anastrozole today and continue to hold it until she hears from [place].  She is scheduled to see Dr. Jana Hakim in 1 month, so I will check with him to see if he would like her to stay off of the anastrozole for the next month and just follow-up with him then vs. Start a new aromatase inhibitor agent in a couple of weeks if the rash resolves and he can assess her tolerance to the new drug at the next visit.  Regardless, we will call her sometime next week to follow-up on her symptoms.   I provided her a prescription today for Atarax 25 mg to be taken TIDprn for the pruritis.  Encouraged her to use caution when taking this new medication, as it may make her sleepy or dizzy.  She voiced understanding and agreed with this plan.    3. Hot flashes:  It is difficult to tell if she was having an adverse reaction to the Effexor XR, giving a paradoxical effect of making the hot flashes worse, or if perhaps the dose needed to have been increased (as was discussed in Dr. Virgie Dad last note) to better control her symptoms.  Either way, she is not interested in trying the Effexor again.  We discussed an additional non-hormonal option we often offer to breast cancer survivors to help manage hot flashes is gabapentin.  She may be a good drug for her to try, particularly since the night sweats have been particularly bad for her.  We discussed the option of once daily dosing of gabapentin at night, which would give her the added benefit of making sleepy in addition to helping the hot  flashes..  However, I do not want to introduce a new medication at this time while we are trying to identify if the anastrozole is the cause of her current rash.  She agreed to wait and will discuss further with Dr. Jana Hakim at future visits.    4. Vaginal dryness: Recommended she try coconut oil, as both a vaginal moisturizer and lubricant.  Also gave her a flyer for the "Healthy Pelvic Floor & Intimacy" class, which would be a good option for her to learn more about how anti-estrogen mediations and menopause affect vaginal health and intimacy and tips/tricks to help manage these effects.    5. Bone health:  Given Ms. Hogsett's age, history of breast cancer, and her current treatment regimen including anti-estrogen therapy, she is at risk for bone demineralization.  Her last DEXA scan was 05/28/14 and was normal.  We discussed that she would likely be due for subsequent scan in 2 years, particularly while on anti-estrogen therapy, to further evaluate her bone health.  In the meantime, she was encouraged to increase her consumption of foods rich in calcium, as well as increase her weight-bearing activities.  She was given education on specific activities to promote bone health.  6. Cancer screening:  Due to Ms. Godman's history and her age, she should receive screening for skin cancers, colon cancer, and gynecologic cancers.  The information and recommendations are listed on the patient's comprehensive care plan/treatment summary and were reviewed in detail with the patient.    7. Health maintenance and wellness promotion: Ms. Teehan was encouraged to consume 5-7 servings of fruits and vegetables per day. We reviewed the "Nutrition Rainbow" handout.  She was also encouraged to engage in moderate to vigorous exercise for  30 minutes per day most days of the week. We discussed the [employer] fitness program, which is designed for cancer survivors to help them become more physically fit after cancer  treatments.  She was instructed to limit her alcohol consumption and continue to abstain from tobacco use.    7. Support services/counseling: It is not uncommon for this period of the patient's cancer care trajectory to be one of many emotions and stressors.  We discussed an opportunity for her to participate in the next session of [facility] ("Finding Your New Normal") support group series designed for patients after they have completed treatment.   Ms. Vandekamp was encouraged to take advantage of our many other support services programs, support groups, and/or counseling in coping with her new life as a cancer survivor after completing anti-cancer treatment.  She was offered support today through active listening and expressive supportive counseling.  She was given information regarding our available services and encouraged to contact me with any questions or for help enrolling in any of our support group/programs.    Dispo:   -Return to cancer center to see Dr. Jana Hakim on 11/29/15  -She is welcome to return back to the Survivorship Clinic at any time; no additional follow-up needed at this time.  -Consider referral back to survivorship as a long-term survivor for continued surveillance  A total of 45 minutes of face-to-face time was spent with this patient with greater than 50% of that time in counseling and care-coordination.   Mike Craze, NP Survivorship Program [place] [phone]   Note: PRIMARY CARE PROVIDER Gara Kroner, MD [phone]

## 2015-11-03 ENCOUNTER — Telehealth: Payer: Self-pay | Admitting: Endocrinology

## 2015-11-03 ENCOUNTER — Encounter (HOSPITAL_COMMUNITY): Payer: Self-pay | Admitting: Emergency Medicine

## 2015-11-03 ENCOUNTER — Emergency Department (HOSPITAL_COMMUNITY)
Admission: EM | Admit: 2015-11-03 | Discharge: 2015-11-03 | Disposition: A | Discharge status: Home / Self Care | Payer: Medicare Other | Attending: Emergency Medicine | Admitting: Emergency Medicine

## 2015-11-03 ENCOUNTER — Telehealth: Payer: Self-pay | Admitting: Adult Health

## 2015-11-03 DIAGNOSIS — Z79899 Other long term (current) drug therapy: Secondary | ICD-10-CM | POA: Diagnosis not present

## 2015-11-03 DIAGNOSIS — Z7982 Long term (current) use of aspirin: Secondary | ICD-10-CM | POA: Diagnosis not present

## 2015-11-03 DIAGNOSIS — R21 Rash and other nonspecific skin eruption: Secondary | ICD-10-CM | POA: Insufficient documentation

## 2015-11-03 DIAGNOSIS — E119 Type 2 diabetes mellitus without complications: Secondary | ICD-10-CM | POA: Insufficient documentation

## 2015-11-03 DIAGNOSIS — Z853 Personal history of malignant neoplasm of breast: Secondary | ICD-10-CM | POA: Insufficient documentation

## 2015-11-03 LAB — CBC WITH DIFFERENTIAL/PLATELET
BASOS PCT: 0 %
Basophils Absolute: 0 10*3/uL (ref 0.0–0.1)
EOS ABS: 0.1 10*3/uL (ref 0.0–0.7)
Eosinophils Relative: 1 %
HCT: 40.6 % (ref 36.0–46.0)
HEMOGLOBIN: 14 g/dL (ref 12.0–15.0)
Lymphocytes Relative: 18 %
Lymphs Abs: 1.6 10*3/uL (ref 0.7–4.0)
MCH: 31.6 pg (ref 26.0–34.0)
MCHC: 34.5 g/dL (ref 30.0–36.0)
MCV: 91.6 fL (ref 78.0–100.0)
MONO ABS: 0.7 10*3/uL (ref 0.1–1.0)
MONOS PCT: 8 %
NEUTROS PCT: 73 %
Neutro Abs: 6.6 10*3/uL (ref 1.7–7.7)
PLATELETS: 363 10*3/uL (ref 150–400)
RBC: 4.43 MIL/uL (ref 3.87–5.11)
RDW: 15 % (ref 11.5–15.5)
WBC: 9.1 10*3/uL (ref 4.0–10.5)

## 2015-11-03 LAB — BASIC METABOLIC PANEL
Anion gap: 9 (ref 5–15)
BUN: 16 mg/dL (ref 6–20)
CALCIUM: 11 mg/dL — AB (ref 8.9–10.3)
CO2: 25 mmol/L (ref 22–32)
Chloride: 105 mmol/L (ref 101–111)
Creatinine, Ser: 0.93 mg/dL (ref 0.44–1.00)
GFR calc Af Amer: >60 mL/min (ref >60)
GLUCOSE: 103 mg/dL — AB (ref 65–99)
Potassium: 4.2 mmol/L (ref 3.5–5.1)
Sodium: 139 mmol/L (ref 135–145)

## 2015-11-03 MED ORDER — DEXAMETHASONE SODIUM PHOSPHATE 10 MG/ML IJ SOLN
10.0000 mg | Freq: Once | INTRAMUSCULAR | Status: AC
  Administered 2015-11-03: 10 mg via INTRAMUSCULAR
  Filled 2015-11-03: qty 1

## 2015-11-03 MED ORDER — DOXYCYCLINE HYCLATE 100 MG PO CAPS: 100.0000 mg | ORAL_CAPSULE | Freq: Two times a day (BID) | ORAL | 0 refills | Status: DC

## 2015-11-03 NOTE — Telephone Encounter (Signed)
I received a call from Amanda [name] to update me on her skin rash & itching.  She tells me that she stopped the Anastrozole last week after she was in the office, and the Atarax was helping the itching significantly.  Then, she began telling me that she has a new "boil" on the back of her right thigh and also has an area that is swollen on her right knee; both are extremely pruritic for her; these new places have come up since yesterday. She states, "I just don't know what to do" and expressed frustration.   She tells me that she did call her PCP, to make them aware, but she has not heard back from them.  I encouraged her to call them back and see if she could be seen today, as she is clearly having some type of skin reaction requiring attention.    I am now less certain that the anastrozole is the etiology of her rash, but it is certainly still possible given the long half-life elimination of anastrozole (~50 hours).  I recommended that she call her PCP back and see if she could be seen today, as she may need referral to dermatology for evaluation and treatment.  She voiced understanding and agreed with this plan.  Encouraged her to continue to hold the anastrozole until we better understand the etiology of her rash.  She knows to call me with any other questions or concerns.   Mike Craze, NP [place] [phone]

## 2015-11-03 NOTE — ED Notes (Signed)
Discharge instructions, follow up care, and rx x1 reviewed with patient. Patient verbalized understanding. 

## 2015-11-03 NOTE — ED Notes (Signed)
Patient refused rectal temperature. MD and PA aware.

## 2015-11-03 NOTE — Discharge Instructions (Signed)
You were given a dose of steroids today. You are also given a prescription for an antibiotic. Continue the Hydroxyzine for itching

## 2015-11-03 NOTE — ED Triage Notes (Signed)
Pt reports rash right knee and left thigh , new meds venlafaxine, Hx breast cancer , no chemo yet receives radiation in April 2017. Pt reports itching and pain in affected areas.

## 2015-11-03 NOTE — Telephone Encounter (Signed)
Since she has taken Invokana form March onwards this is unlikely.  She can stop the pitavastatin which is new but if she continues to have a rash she needs to see her PCP or dermatologist

## 2015-11-03 NOTE — Telephone Encounter (Signed)
Please see below and advise.

## 2015-11-03 NOTE — ED Provider Notes (Signed)
[facility] DEPT Provider Note   CSN: [id number] Arrival date & time: 11/03/15  1332  By signing my name below, I, Jeanell Sparrow, attest that this documentation has been prepared under the direction and in the presence of non-physician practitioner, Janetta Hora, PA-C. Electronically Signed: Jeanell Sparrow, Scribe. 11/03/2015. 4:04 PM.  History   Chief Complaint Chief Complaint  Patient presents with  . Rash   The history is provided by the patient. No language interpreter was used.   HPI Comments: Amanda [name] is a 70 y.o. female who presents to the Emergency Department complaining of a rash. PMH significant for hx of breast cancer and she is currently on chemo (taking anastrozole which she started on 10/08/15). She first started to develop a rash 3 weeks ago (around 9/8). It initially started as a small vesicle/bump on her hands, legs, torso. She reports associated itching and that they would burst and heal over. She has been taking Benadryl and using Hydrocortisone OTC with minimal relief. She was seen by her oncologist on 9/22 and the anastrozole was stopped to see if this was the cause. She was also put on Atarax at that time which she has been taking with minimal relief as well. Her original rash has improved however she presents today because of a new rash on the back of her left thigh and on the anterior right knee. It is red, itchy, swollen. Pt states she was seen 5 days ago for a rash on her right hand and left calf that has improved after medication. Denies any sick contacts or fever. She has not seen a dermatologist yet.   Past Medical History:  Diagnosis Date  . Arthritis   . Breast cancer ([place])   . Breast cancer of upper-outer quadrant of right female breast ([place]) 03/24/2015  . Carpal tunnel syndrome   . Diabetes mellitus without complication ([place])   . GERD (gastroesophageal reflux disease)    Pt takes OTC when needed.  Marland Kitchen Hot flashes     Patient Active Problem List   Diagnosis Date Noted  . Diabetes mellitus type II, non insulin dependent ([place]) 03/31/2015  . Breast cancer of upper-outer quadrant of right female breast ([place]) 03/24/2015  . Acute blood loss anemia 07/20/2013  . Osteoarthritis of right knee 07/18/2013  . Osteoarthritis of left knee 07/18/2013  . Hyperlipidemia 11/26/2012  . Osteoarthritis of both knees 11/26/2012  . COUGH 03/11/2007    Past Surgical History:  Procedure Laterality Date  . ABDOMINAL HYSTERECTOMY    . APPENDECTOMY    . COLONOSCOPY    . ESOPHAGOGASTRODUODENOSCOPY    . EYE SURGERY Bilateral    Cataracts removed  . JOINT REPLACEMENT    . RADIOACTIVE SEED GUIDED MASTECTOMY WITH AXILLARY SENTINEL LYMPH NODE BIOPSY Right 04/16/2015   Procedure: RADIOACTIVE SEED GUIDED PARTIAL MASTECTOMY WITH AXILLARY SENTINEL LYMPH NODE BIOPSY;  Surgeon: Alphonsa Overall, MD;  Location: [facility];  Service: General;  Laterality: Right;  . SHOULDER SURGERY Right   . TONSILLECTOMY    . TOTAL KNEE ARTHROPLASTY Right 07/18/2013   DR GRAVES  . TOTAL KNEE ARTHROPLASTY Right 07/18/2013   Procedure: TOTAL KNEE ARTHROPLASTY;  Surgeon: Alta Corning, MD;  Location: [facility];  Service: Orthopedics;  Laterality: Right;    OB History    No data available       Home Medications    Prior to Admission medications   Medication Sig Start Date End Date Taking? Authorizing Provider  anastrozole (ARIMIDEX) 1 MG tablet  Take 1 tablet (1 mg total) by mouth daily. 07/26/15   Chauncey Cruel, MD  aspirin 81 MG tablet Take 81 mg by mouth daily.    Historical Provider, MD  canagliflozin (INVOKANA) 100 MG TABS tablet 1 tablet before breakfast 05/04/15   [name], MD  Dulaglutide (TRULICITY) A999333 [id number] SOPN INJECT 0.75 MG UNDER THE SKIN ONCE A WEEK 07/08/15   [name], MD  fexofenadine (ALLEGRA) 180 MG tablet Take 180 mg by mouth daily.    Historical Provider, MD  glucose blood (ONE TOUCH ULTRA TEST) test strip USE TO TEST BLOOD SUGAR ONE TIME  DAILY AS INSTRUCTED Dx code E11.9 07/08/15   [name], MD  hydrOXYzine (ATARAX/VISTARIL) 25 MG tablet Take 1 tablet (25 mg total) by mouth 3 (three) times daily as needed for itching. 10/29/15   Holley Bouche, NP  montelukast (SINGULAIR) 10 MG tablet Take 10 mg by mouth at bedtime. Reported on 08/20/2015    Historical Provider, MD  Pitavastatin Calcium 2 MG TABS 2mg  10/20/15   [name], MD  ranitidine (ZANTAC) 150 MG tablet Take 150 mg by mouth 2 (two) times daily.    Historical Provider, MD  venlafaxine XR (EFFEXOR-XR) 37.5 MG 24 hr capsule Take 1 capsule (37.5 mg total) by mouth daily with breakfast. Patient not taking: Reported on 10/29/2015 07/26/15   Chauncey Cruel, MD    Family History No family history on file.  Social History Social History  Substance Use Topics  . Smoking status: Never Smoker  . Smokeless tobacco: Never Used  . Alcohol use Yes     Comment: socially, occ weekends     Allergies   Meperidine hcl and Statins   Review of Systems Review of Systems  Constitutional: Negative for fever.  Skin: Positive for rash.     Physical Exam Updated Vital Signs BP 160/85 (BP Location: Left Arm)   Pulse 109   Temp 99.1 F (37.3 C) (Oral)   Resp 16   Ht 5\' 2"  (1.575 m)   Wt 180 lb (81.6 kg)   SpO2 99%   BMI 32.92 kg/m   Physical Exam  Constitutional: She is oriented to person, place, and time. She appears well-developed and well-nourished. No distress.  HENT:  Head: Normocephalic and atraumatic.  Eyes: Conjunctivae are normal. Pupils are equal, round, and reactive to light. Right eye exhibits no discharge. Left eye exhibits no discharge. No scleral icterus.  Neck: Normal range of motion. Neck supple.  Cardiovascular: Normal rate and regular rhythm.   No murmur heard. Pulmonary/Chest: Effort normal and breath sounds normal. No respiratory distress.  Abdominal: Soft. She exhibits no distension. There is no tenderness.  Musculoskeletal: She exhibits no  edema.  Neurological: She is alert and oriented to person, place, and time.  Skin: Skin is warm and dry. Rash noted.  Small, red bump on the posterior left thigh and anterior right knee with surrounding erythema. No fluctuance or drainage.  Psychiatric: She has a normal mood and affect. Her behavior is normal.  Nursing note and vitals reviewed.    ED Treatments / Results  DIAGNOSTIC STUDIES: Oxygen Saturation is 99% on RA, normal by my interpretation.    COORDINATION OF CARE: 4:08 PM- Pt advised of plan for treatment and pt agrees.  Labs (all labs ordered are listed, but only abnormal results are displayed) Labs Reviewed - No data to display  EKG  EKG Interpretation None       Radiology No results found.  Procedures  Procedures (including critical care time)  Medications Ordered in ED Medications  dexamethasone (DECADRON) injection 10 mg (10 mg Intramuscular Given 11/03/15 1755)     Initial Impression / Assessment and Plan / ED Course  I have reviewed the triage vital signs and the nursing notes.  Pertinent labs & imaging results that were available during my care of the patient were reviewed by me and considered in my medical decision making (see chart for details).  Clinical Course   70 year old female presents with a rash of unclear etiology. Rash almost appears to be consistent with bug bites and less likely a drug rash. Shared visit with Dr. Regenia Skeeter. Basic labs obtained which are unremarkable. Will cover for allergic reaction and possible infection and encourage PCP follow up who can refer who to derm. Decadron given. Antibiotic rx given. Patient is NAD, non-toxic, with stable VS. Patient is informed of clinical course, understands medical decision making process, and agrees with plan. Opportunity for questions provided and all questions answered. Return precautions given.   Final Clinical Impressions(s) / ED Diagnoses   Final diagnoses:  Rash    New  Prescriptions Discharge Medication List as of 11/03/2015  5:59 PM    START taking these medications   Details  doxycycline (VIBRAMYCIN) 100 MG capsule Take 1 capsule (100 mg total) by mouth 2 (two) times daily., Starting Wed 11/03/2015, Print       I personally performed the services described in this documentation, which was scribed in my presence. The recorded information has been reviewed and is accurate.     Recardo Evangelist, PA-C 11/07/15 1711    Sherwood Gambler, MD 11/08/15 1739

## 2015-11-03 NOTE — Telephone Encounter (Signed)
Patient thinks the medication INVOKANA or the Pravastatin is braking her out it started out like ant bites to sores to boils, her body is swelling and itching really bad. Please advise

## 2015-11-03 NOTE — Telephone Encounter (Signed)
Patient is currently at [facility], she said her whole left side is very swollen and broken out. She's very upset and scared because she has no clue what's wrong with her.

## 2015-11-04 ENCOUNTER — Telehealth: Payer: Self-pay | Admitting: *Deleted

## 2015-11-04 NOTE — Telephone Encounter (Signed)
Called to follow-up with pt about the rash she presented with on last week. Pt informed me that she went to ED yesterday b/c rash had gotten worse and the itching as well. I asked pt did they suggest that she see a dermatologist, she say at the end of the visit they did, but did not refer her to anyone. I told pt we will put in a referral for her to be seen by a dermatologist. Pt told me she is taking the anastrozole. Message fwd to [employer].

## 2015-11-04 NOTE — Telephone Encounter (Signed)
Called pt to inform her of appt with Dr. Lennie Odor at [facility] concerning rash. Pt's appt is 11/05/15@ 11:00. Pt verbalized understanding and thanked [place] for being quick about getting her referred. I will continue to f/u. Message to be fwd to G.Dawson,NP.

## 2015-11-19 ENCOUNTER — Telehealth: Payer: Self-pay | Admitting: *Deleted

## 2015-11-19 NOTE — Telephone Encounter (Signed)
Called pt to f/u about rash and visit to the dermatologist. Pt was started on Prednisone to tapered dose 2 weeks ago, also started on Zantac and continued the Vistaril. As pt was tapering down from the Prednisone, skin eruptions started again . Pt went back to doctor and was diagnosed with having mites. The doctor visualized the tracks of mites under pt's skin and now pt is on the second round of Prednisone plus using several prescription creams for irritation  and itching. Pt said today has been a better day for her. I asked if she has a follow-up appt  with dermatologist. Pt said no but doctor said if she has any more problems to call her. Pt said the pet that had the mites was at a friend's house.

## 2015-11-29 ENCOUNTER — Ambulatory Visit (HOSPITAL_BASED_OUTPATIENT_CLINIC_OR_DEPARTMENT_OTHER): Payer: Medicare Other | Admitting: Oncology

## 2015-11-29 VITALS — BP 125/50 | HR 73 | Temp 98.3°F | Resp 18 | Ht 62.0 in | Wt 186.7 lb

## 2015-11-29 DIAGNOSIS — Z170 Estrogen receptor positive status [ER+]: Secondary | ICD-10-CM

## 2015-11-29 DIAGNOSIS — Z79811 Long term (current) use of aromatase inhibitors: Secondary | ICD-10-CM | POA: Diagnosis not present

## 2015-11-29 DIAGNOSIS — C50411 Malignant neoplasm of upper-outer quadrant of right female breast: Secondary | ICD-10-CM

## 2015-11-29 NOTE — Progress Notes (Signed)
[facility]  Telephone:([phone]) [phone] [phone]) [phone]     ID: Amanda [name] DOB: [birth year]  MR#: [id number]  ZJQ#:[id number]  Patient Care Team: Antony Contras, MD as PCP - General (Family Medicine) Sylvan Cheese, NP as Nurse Practitioner (Hematology and Oncology) Chauncey Cruel, MD as Consulting Physician (Oncology) Thea Silversmith, MD as Consulting Physician (Radiation Oncology) Alphonsa Overall, MD as Consulting Physician (General Surgery) [name], MD as Consulting Physician (Endocrinology) PCP: Gara Kroner, MD GYN: OTHER MD:  CHIEF COMPLAINT: estrogen receptor positive breast cancer  CURRENT TREATMENT: anastrozole   BREAST CANCER HISTORY:  from the original intake note:  Amanda [name] had routine screening mammography at [facility] 03/10/2015. The breast density was category B. There was a new cluster of nodules in the right breast at the 11:00 position measuring approximately 5 mm. Ultrasonography 03/15/2014 confirmed a 1.1 center meter oval mass in the right breast at the 10:00 position 6 cm from the nipple. There was a benign 0.8 cm simple cyst immediately adjacent to the mass. The right axilla was sonographically benign.  Biopsy of the right breast mass in question 03/22/2015 showed ([id number]) invasive ductal carcinoma, grade 1, estrogen receptor 95% positive, progesterone receptor 80% positive, both with strong staining intensity, with an MIB-1 of 15%, and no HER-2 amplification, the signals ratio being 1.58 and the number per cell 3.00.  Her subsequent history is as detailed below  INTERVAL HISTORY: Amanda [name] returns today for follow-up of her estrogen receptor positive breast cancer. She started anastrozole 10/08/2015 but after approximately 3 weeks she developed a rash. We asked her to stop the medication and come see [place]. In the interim though she saw a dermatologist who told her the rash was really due to mites. She was given prednisone, and permethrin.  The rash got better but now she is getting more bites and she thinks it may be the PET from next door. At any rate she is convinced that the rash has nothing to do with anastrozole and incidentally when she took it for those 3 weeks she had no significant problems with hot flashes vaginal dryness or other issues. She also was able to obtain it at a good price.  REVIEW OF SYSTEMS: A detailed review of systems today was otherwise entirely stable  PAST MEDICAL HISTORY: Past Medical History:  Diagnosis Date  . Arthritis   . Breast cancer ([place])   . Breast cancer of upper-outer quadrant of right female breast ([place]) 03/24/2015  . Carpal tunnel syndrome   . Diabetes mellitus without complication ([place])   . GERD (gastroesophageal reflux disease)    Pt takes OTC when needed.  Marland Kitchen Hot flashes     PAST SURGICAL HISTORY: Past Surgical History:  Procedure Laterality Date  . ABDOMINAL HYSTERECTOMY    . APPENDECTOMY    . COLONOSCOPY    . ESOPHAGOGASTRODUODENOSCOPY    . EYE SURGERY Bilateral    Cataracts removed  . JOINT REPLACEMENT    . RADIOACTIVE SEED GUIDED MASTECTOMY WITH AXILLARY SENTINEL LYMPH NODE BIOPSY Right 04/16/2015   Procedure: RADIOACTIVE SEED GUIDED PARTIAL MASTECTOMY WITH AXILLARY SENTINEL LYMPH NODE BIOPSY;  Surgeon: Alphonsa Overall, MD;  Location: [facility];  Service: General;  Laterality: Right;  . SHOULDER SURGERY Right   . TONSILLECTOMY    . TOTAL KNEE ARTHROPLASTY Right 07/18/2013   DR GRAVES  . TOTAL KNEE ARTHROPLASTY Right 07/18/2013   Procedure: TOTAL KNEE ARTHROPLASTY;  Surgeon: Alta Corning, MD;  Location: [facility];  Service: Orthopedics;  Laterality: Right;    FAMILY HISTORY No family history on file. The patient's father died in his 15s from reasons unclear to the patient. Her mother died from heart disease at the age of 30. The patient had one brother, 5 sisters. There is no history of breast or ovarian cancer in the family to her knowledge.  GYNECOLOGIC  HISTORY:  No LMP recorded. Patient has had a hysterectomy. Menarche age 43, first live birth age 79. She is GX P2. She underwent hysterectomy with bilateral salpingo-oophorectomy in her 66s. 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 She is divorced and lives alone, with no pets. Son Amanda [name] lives in [place] where he manages an auto zone. Daughter Amanda [name] lives in [place]. She works for [employer]. The patient has 4 grandchildren. She is a Psychologist, forensic.    ADVANCED DIRECTIVES: Not in place   HEALTH MAINTENANCE: Social History  Substance Use Topics  . Smoking status: Never Smoker  . Smokeless tobacco: Never Used  . Alcohol use Yes     Comment: socially, occ weekends     Colonoscopy: 2016/ [facility]  PAP: Status post hysterectomy  Bone density: 2016/ normal/ [facility]  Lipid panel:  Allergies  Allergen Reactions  . Meperidine Hcl     REACTION: rash  . Statins Other (See Comments)    Muscle pain. Pt able to take Crestor but not everyday.    Current Outpatient Prescriptions  Medication Sig Dispense Refill  . anastrozole (ARIMIDEX) 1 MG tablet Take 1 tablet (1 mg total) by mouth daily. 90 tablet 4  . aspirin 81 MG tablet Take 81 mg by mouth daily.    . canagliflozin (INVOKANA) 100 MG TABS tablet 1 tablet before breakfast 30 tablet 3  . doxycycline (VIBRAMYCIN) 100 MG capsule Take 1 capsule (100 mg total) by mouth 2 (two) times daily. 10 capsule 0  . Dulaglutide (TRULICITY) 4.09 WJ/1.9JY SOPN INJECT 0.75 MG UNDER THE SKIN ONCE A WEEK 6 mL 1  . fexofenadine (ALLEGRA) 180 MG tablet Take 180 mg by mouth daily.    Marland Kitchen glucose blood (ONE TOUCH ULTRA TEST) test strip USE TO TEST BLOOD SUGAR ONE TIME DAILY AS INSTRUCTED Dx code E11.9 100 each 1  . hydrOXYzine (ATARAX/VISTARIL) 25 MG tablet Take 1 tablet (25 mg total) by mouth 3 (three) times daily as needed for itching. 30 tablet 1  . montelukast  (SINGULAIR) 10 MG tablet Take 10 mg by mouth at bedtime. Reported on 08/20/2015    . Pitavastatin Calcium 2 MG TABS 10m 90 tablet 0  . ranitidine (ZANTAC) 150 MG tablet Take 150 mg by mouth 2 (two) times daily.    .Marland Kitchenvenlafaxine XR (EFFEXOR-XR) 37.5 MG 24 hr capsule Take 1 capsule (37.5 mg total) by mouth daily with breakfast. (Patient not taking: Reported on 10/29/2015) 30 capsule 4   No current facility-administered medications for this visit.     OBJECTIVE: Middle-aged African-American woman  Who appears well Vitals:   11/29/15 1346  BP: (!) 125/50  Pulse: 73  Resp: 18  Temp: 98.3 F (36.8 C)     Body mass index is 34.15 kg/m.    ECOG FS:1 - Symptomatic but completely ambulatory  Sclerae unicteric, pupils round and equal Oropharynx clear and moist-- no thrush or other lesions No cervical or supraclavicular adenopathy Lungs no rales or rhonchi Heart regular rate and rhythm Abd soft, nontender, positive bowel sounds MSK  no focal spinal tenderness, no upper extremity lymphedema Neuro: nonfocal, well oriented, appropriate affect Breasts: The right breast is status post lumpectomy followed by radiation. There is minimal hyperpigmentation but no evidence of local recurrence. The right axilla is benign. The left breast is unremarkable Skin: There are some lesions particularly in the left upper arm which are consistent with insect bites that have been excoriated   LAB RESULTS:  CMP     Component Value Date/Time   NA 139 11/03/2015 1701   NA 143 03/31/2015 0831   K 4.2 11/03/2015 1701   K 4.0 03/31/2015 0831   CL 105 11/03/2015 1701   CO2 25 11/03/2015 1701   CO2 27 03/31/2015 0831   GLUCOSE 103 (H) 11/03/2015 1701   GLUCOSE 149 (H) 03/31/2015 0831   BUN 16 11/03/2015 1701   BUN 17.6 03/31/2015 0831   CREATININE 0.93 11/03/2015 1701   CREATININE 0.9 03/31/2015 0831   CALCIUM 11.0 (H) 11/03/2015 1701   CALCIUM 10.2 03/31/2015 0831   PROT 7.0 08/16/2015 0906   PROT 7.8  03/31/2015 0831   ALBUMIN 3.9 08/16/2015 0906   ALBUMIN 3.9 03/31/2015 0831   AST 16 08/16/2015 0906   AST 20 03/31/2015 0831   ALT 12 08/16/2015 0906   ALT 17 03/31/2015 0831   ALKPHOS 56 08/16/2015 0906   ALKPHOS 55 03/31/2015 0831   BILITOT 0.4 08/16/2015 0906   BILITOT 0.53 03/31/2015 0831   GFRNONAA >60 11/03/2015 1701   GFRAA >60 11/03/2015 1701    INo results found for: SPEP, UPEP  Lab Results  Component Value Date   WBC 9.1 11/03/2015   NEUTROABS 6.6 11/03/2015   HGB 14.0 11/03/2015   HCT 40.6 11/03/2015   MCV 91.6 11/03/2015   PLT 363 11/03/2015      Chemistry      Component Value Date/Time   NA 139 11/03/2015 1701   NA 143 03/31/2015 0831   K 4.2 11/03/2015 1701   K 4.0 03/31/2015 0831   CL 105 11/03/2015 1701   CO2 25 11/03/2015 1701   CO2 27 03/31/2015 0831   BUN 16 11/03/2015 1701   BUN 17.6 03/31/2015 0831   CREATININE 0.93 11/03/2015 1701   CREATININE 0.9 03/31/2015 0831      Component Value Date/Time   CALCIUM 11.0 (H) 11/03/2015 1701   CALCIUM 10.2 03/31/2015 0831   ALKPHOS 56 08/16/2015 0906   ALKPHOS 55 03/31/2015 0831   AST 16 08/16/2015 0906   AST 20 03/31/2015 0831   ALT 12 08/16/2015 0906   ALT 17 03/31/2015 0831   BILITOT 0.4 08/16/2015 0906   BILITOT 0.53 03/31/2015 0831       No results found for: LABCA2  No components found for: LABCA125  No results for input(s): INR in the last 168 hours.  Urinalysis    Component Value Date/Time   COLORURINE YELLOW 06/29/2015 0000   APPEARANCEUR CLOUDY (A) 06/29/2015 0000   LABSPEC 1.031 (H) 06/29/2015 0000   PHURINE 5.0 06/29/2015 0000   GLUCOSEU >1000 (A) 06/29/2015 0000   GLUCOSEU NEGATIVE 04/29/2015 1010   HGBUR NEGATIVE 06/29/2015 0000   BILIRUBINUR NEGATIVE 06/29/2015 0000   KETONESUR NEGATIVE 06/29/2015 0000   PROTEINUR NEGATIVE 06/29/2015 0000   UROBILINOGEN 0.2 04/29/2015 1010   NITRITE NEGATIVE 06/29/2015 0000   LEUKOCYTESUR NEGATIVE 06/29/2015 0000      ELIGIBLE  FOR AVAILABLE RESEARCH PROTOCOL: no  STUDIES: No results found.  ASSESSMENT: 70 y.o. [facility] woman status post right breast upper outer quadrant biopsy  03/22/2015 for a clinical T1CN0, stage IA invasive ductal carcinoma, grade 1, estrogen and progesterone receptor positive, HER-2 not amplified, with an MIB-1 of 15%.  (1)  Status post right lumpectomy and sentinel lymph node sampling 04/16/2015 for a pT1c pN0, stage IA  Invasive ductal carcinoma, grade 1, repeat HER-2 again negative  (2) Oncotype DX  Score of 12 predicts a risk of outside the breast recurrence of 8% within 10 years if the patient's only systemic therapy is tamoxifen for 5 years. It also predicts no benefit from chemotherapy.  (3) adjuvant radiation completed 07/19/2015   (4) started anastrozole 10/08/2015, interrupted because of a rash, we started 11/29/2015  PLAN:  Amanda [name] is willing to give and anastrozole a second try and hopefully at this point she will tolerated without any significant side effects. Just in case I'm going to see her again in 3 months. If she is tolerating anastrozole well the plan will be to evaluate bone density issues and optimize osteoporosis prevention, while continuing anastrozole for a total of 5 years  She knows to call for any problems that may develop before her next visit here.  :Chauncey Cruel, MD   11/29/2015 1:52 PM Medical Oncology and Hematology [facility] [place], [place] [address] Tel. [phone]    Fax. [phone]

## 2015-12-06 ENCOUNTER — Other Ambulatory Visit: Payer: Self-pay | Admitting: Endocrinology

## 2015-12-06 ENCOUNTER — Telehealth: Payer: Self-pay | Admitting: Oncology

## 2015-12-06 NOTE — Telephone Encounter (Signed)
Spoke with patient re February appointments.  °

## 2016-01-19 ENCOUNTER — Other Ambulatory Visit: Payer: Self-pay | Admitting: Endocrinology

## 2016-03-21 ENCOUNTER — Ambulatory Visit: Payer: Medicare Other | Admitting: Oncology

## 2016-03-21 ENCOUNTER — Other Ambulatory Visit: Payer: Medicare Other

## 2016-03-21 ENCOUNTER — Encounter: Payer: Self-pay | Admitting: Oncology

## 2016-03-21 NOTE — Progress Notes (Signed)
No show

## 2016-04-06 ENCOUNTER — Telehealth: Payer: Self-pay

## 2016-04-06 ENCOUNTER — Other Ambulatory Visit: Payer: Self-pay | Admitting: Endocrinology

## 2016-04-06 NOTE — Telephone Encounter (Signed)
Pt called to r/s a no show appt of 2/13. In basket sent.

## 2016-04-07 NOTE — Telephone Encounter (Signed)
Patient stated pharmacy haven't received invokanna.  [id number], [place] [phone] (Phone) [phone] (Fax)

## 2016-04-10 ENCOUNTER — Other Ambulatory Visit (INDEPENDENT_AMBULATORY_CARE_PROVIDER_SITE_OTHER): Payer: Medicare Other

## 2016-04-10 DIAGNOSIS — E1165 Type 2 diabetes mellitus with hyperglycemia: Secondary | ICD-10-CM | POA: Diagnosis not present

## 2016-04-10 LAB — LIPID PANEL
CHOL/HDL RATIO: 3
Cholesterol: 191 mg/dL (ref 0–200)
HDL: 61.3 mg/dL (ref >39.00)
LDL Cholesterol: 112 mg/dL — ABNORMAL HIGH (ref 0–99)
NONHDL: 129.52
Triglycerides: 87 mg/dL (ref 0.0–149.0)
VLDL: 17.4 mg/dL (ref 0.0–40.0)

## 2016-04-10 LAB — COMPREHENSIVE METABOLIC PANEL
ALK PHOS: 61 U/L (ref 39–117)
ALT: 57 U/L — AB (ref 0–35)
AST: 32 U/L (ref 0–37)
Albumin: 4 g/dL (ref 3.5–5.2)
BILIRUBIN TOTAL: 0.4 mg/dL (ref 0.2–1.2)
BUN: 19 mg/dL (ref 6–23)
CALCIUM: 10 mg/dL (ref 8.4–10.5)
CO2: 30 mEq/L (ref 19–32)
CREATININE: 0.84 mg/dL (ref 0.40–1.20)
Chloride: 107 mEq/L (ref 96–112)
GFR: 86 mL/min (ref >60.00)
GLUCOSE: 146 mg/dL — AB (ref 70–99)
Potassium: 4.6 mEq/L (ref 3.5–5.1)
Sodium: 142 mEq/L (ref 135–145)
TOTAL PROTEIN: 6.8 g/dL (ref 6.0–8.3)

## 2016-04-10 LAB — MICROALBUMIN / CREATININE URINE RATIO
CREATININE, U: 53.7 mg/dL
MICROALB/CREAT RATIO: 1.3 mg/g (ref 0.0–30.0)
Microalb, Ur: <0.7 mg/dL (ref 0.0–1.9)

## 2016-04-10 LAB — HEMOGLOBIN A1C: Hgb A1c MFr Bld: 7.5 % — ABNORMAL HIGH (ref 4.6–6.5)

## 2016-04-14 ENCOUNTER — Other Ambulatory Visit: Payer: Self-pay

## 2016-04-14 ENCOUNTER — Ambulatory Visit (INDEPENDENT_AMBULATORY_CARE_PROVIDER_SITE_OTHER): Payer: Medicare Other | Admitting: Endocrinology

## 2016-04-14 ENCOUNTER — Encounter: Payer: Self-pay | Admitting: Endocrinology

## 2016-04-14 VITALS — BP 122/68 | HR 81 | Ht 62.0 in | Wt 183.0 lb

## 2016-04-14 DIAGNOSIS — E782 Mixed hyperlipidemia: Secondary | ICD-10-CM

## 2016-04-14 DIAGNOSIS — E1165 Type 2 diabetes mellitus with hyperglycemia: Secondary | ICD-10-CM | POA: Diagnosis not present

## 2016-04-14 MED ORDER — CANAGLIFLOZIN 300 MG PO TABS: 300.0000 mg | ORAL_TABLET | Freq: Every day | ORAL | 3 refills | Status: DC

## 2016-04-14 MED ORDER — PITAVASTATIN CALCIUM 4 MG PO TABS: ORAL_TABLET | ORAL | 3 refills | Status: DC

## 2016-04-14 NOTE — Progress Notes (Signed)
Amanda [name] is an 71 y.o. female.             Reason for Appointment: Diabetes follow-up   Initial diagnosis of diabetes:?  2007   History of Present Illness   Diagnosis: Type 2 DIABETES MELITUS     Previous history: She has had difficulty with blood sugar control initially because of limited tolerability with metformin and difficulty with weight loss. She did very well with starting Victoza which has helped her blood sugar control as well as some improvement in her obesity Her insurance company did not prefer Victoza and she was switched to Trulicity Subsequently her glipizide was stopped She was started on Invokana 100 mg daily in addition to her Trulicity 7.32 mg when her blood sugars were higher in the/17  Recent history:    She has not been seen in follow-up for several months Her A1c is now 7.5, previously 6.6   Current blood sugar patterns, management and problems identified:   She has checked a few blood sugars at home at lunchtime mostly and occasionally in the morning as below  She says she has had a round of antibiotics for an infection and also got steroids injection in her knee about 2 weeks ago  However does not have any documented high readings with these episodes  She has tried to do some walking intermittently but overall has gained some weight since last year  She says she has a little nausea with Invokana unless she eats right after taking this  Diet has been fair, not always consistent     Oral hypoglycemic drugs:   Invokana 202 mg daily   Trulicity 5.42 mg weekly      Side effects from medications: Diarrhea from metformin.  Nausea from 1.5 mg Trulicity         Monitors blood glucose:  less than Once a day.    Glucometer: One Touch ultra 2             Blood Glucose readings 100-140 at various times, only a couple readings in the evenings after evening meal  Mean values apply above for all meters except median for One Touch  PRE-MEAL Fasting Lunch  Dinner Bedtime Overall  Glucose range: 133, 136 139-161     Mean/median:        POST-MEAL PC Breakfast PC Lunch PC Dinner  Glucose range:     Mean/median:               Physical activity: exercise: walking on treadmill 3-4/7 days upto 60 min            Wt Readings from Last 3 Encounters:  04/14/16 183 lb (83 kg)  11/29/15 186 lb 11.2 oz (84.7 kg)  11/03/15 180 lb (81.6 kg)    LABS:  Lab Results  Component Value Date   HGBA1C 7.5 (H) 04/10/2016   HGBA1C 6.6 (H) 08/16/2015   HGBA1C 7.4 (H) 04/29/2015   Lab Results  Component Value Date   MICROALBUR <0.7 04/10/2016   LDLCALC 112 (H) 04/10/2016   CREATININE 0.84 04/10/2016     Lab on 04/10/2016  Component Date Value Ref Range Status  . Hgb A1c MFr Bld 04/10/2016 7.5* 4.6 - 6.5 % Final  . Sodium 04/10/2016 142  135 - 145 mEq/L Final  . Potassium 04/10/2016 4.6  3.5 - 5.1 mEq/L Final  . Chloride 04/10/2016 107  96 - 112 mEq/L Final  . CO2 04/10/2016 30  19 - 32 mEq/L Final  .  Glucose, Bld 04/10/2016 146* 70 - 99 mg/dL Final  . BUN 04/10/2016 19  6 - 23 mg/dL Final  . Creatinine, Ser 04/10/2016 0.84  0.40 - 1.20 mg/dL Final  . Total Bilirubin 04/10/2016 0.4  0.2 - 1.2 mg/dL Final  . Alkaline Phosphatase 04/10/2016 61  39 - 117 U/L Final  . AST 04/10/2016 32  0 - 37 U/L Final  . ALT 04/10/2016 57* 0 - 35 U/L Final  . Total Protein 04/10/2016 6.8  6.0 - 8.3 g/dL Final  . Albumin 04/10/2016 4.0  3.5 - 5.2 g/dL Final  . Calcium 04/10/2016 10.0  8.4 - 10.5 mg/dL Final  . GFR 04/10/2016 86.00  >60.00 mL/min Final  . Microalb, Ur 04/10/2016 <0.7  0.0 - 1.9 mg/dL Final  . Creatinine,U 04/10/2016 53.7  mg/dL Final  . Microalb Creat Ratio 04/10/2016 1.3  0.0 - 30.0 mg/g Final  . Cholesterol 04/10/2016 191  0 - 200 mg/dL Final  . Triglycerides 04/10/2016 87.0  0.0 - 149.0 mg/dL Final  . HDL 04/10/2016 61.30  >39.00 mg/dL Final  . VLDL 04/10/2016 17.4  0.0 - 40.0 mg/dL Final  . LDL Cholesterol 04/10/2016 112* 0 - 99 mg/dL  Final  . Total CHOL/HDL Ratio 04/10/2016 3   Final  . NonHDL 04/10/2016 129.52   Final    Allergies as of 04/14/2016      Reactions   Meperidine Hcl    REACTION: rash   Statins Other (See Comments)   Muscle pain. Pt able to take Crestor but not everyday.      Medication List       Accurate as of 04/14/16 10:26 AM. Always use your most recent med list.          anastrozole 1 MG tablet Commonly known as:  ARIMIDEX Take 1 tablet (1 mg total) by mouth daily.   aspirin 81 MG tablet Take 81 mg by mouth daily.   doxycycline 100 MG capsule Commonly known as:  VIBRAMYCIN Take 1 capsule (100 mg total) by mouth 2 (two) times daily.   fexofenadine 180 MG tablet Commonly known as:  ALLEGRA Take 180 mg by mouth daily.   glucose blood test strip Commonly known as:  ONE TOUCH ULTRA TEST USE TO TEST BLOOD SUGAR ONE TIME DAILY AS INSTRUCTED Dx code E11.9   hydrOXYzine 25 MG tablet Commonly known as:  ATARAX/VISTARIL Take 1 tablet (25 mg total) by mouth 3 (three) times daily as needed for itching.   INVOKANA 100 MG Tabs tablet Generic drug:  canagliflozin take 1 tablet by mouth once daily BEFORE BREAKFAST   montelukast 10 MG tablet Commonly known as:  SINGULAIR Take 10 mg by mouth at bedtime. Reported on 08/20/2015   Pitavastatin Calcium 2 MG Tabs 2mg    ranitidine 150 MG tablet Commonly known as:  ZANTAC Take 150 mg by mouth 2 (two) times daily.   TRULICITY 5.03 TW/6.5KC Sopn Generic drug:  Dulaglutide INJECT 0.75 MG UNDER THE SKIN ONCE A WEEK   venlafaxine XR 37.5 MG 24 hr capsule Commonly known as:  EFFEXOR-XR Take 1 capsule (37.5 mg total) by mouth daily with breakfast.       Allergies:  Allergies  Allergen Reactions  . Meperidine Hcl     REACTION: rash  . Statins Other (See Comments)    Muscle pain. Pt able to take Crestor but not everyday.    Past Medical History:  Diagnosis Date  . Arthritis   . Breast cancer ([place])   . Breast  cancer of upper-outer  quadrant of right female breast ([place]) 03/24/2015  . Carpal tunnel syndrome   . Diabetes mellitus without complication ([place])   . GERD (gastroesophageal reflux disease)    Pt takes OTC when needed.  Marland Kitchen Hot flashes     Past Surgical History:  Procedure Laterality Date  . ABDOMINAL HYSTERECTOMY    . APPENDECTOMY    . COLONOSCOPY    . ESOPHAGOGASTRODUODENOSCOPY    . EYE SURGERY Bilateral    Cataracts removed  . JOINT REPLACEMENT    . RADIOACTIVE SEED GUIDED MASTECTOMY WITH AXILLARY SENTINEL LYMPH NODE BIOPSY Right 04/16/2015   Procedure: RADIOACTIVE SEED GUIDED PARTIAL MASTECTOMY WITH AXILLARY SENTINEL LYMPH NODE BIOPSY;  Surgeon: Alphonsa Overall, MD;  Location: [facility];  Service: General;  Laterality: Right;  . SHOULDER SURGERY Right   . TONSILLECTOMY    . TOTAL KNEE ARTHROPLASTY Right 07/18/2013   DR GRAVES  . TOTAL KNEE ARTHROPLASTY Right 07/18/2013   Procedure: TOTAL KNEE ARTHROPLASTY;  Surgeon: Alta Corning, MD;  Location: [facility];  Service: Orthopedics;  Laterality: Right;    No family history on file.  Social History:  reports that she has never smoked. She has never used smokeless tobacco. She reports that she drinks alcohol. She reports that she does not use drugs.  Review of Systems:     Lipids:  She was switched from Crestor to Livalo because of her difficulty tolerating this because of joint pains. However she is tolerating Livalo 2 mg well and is taking this daily Her LDL is significantly better and baseline LDL has been as high as 200     Lab Results  Component Value Date   CHOL 191 04/10/2016   HDL 61.30 04/10/2016   LDLCALC 112 (H) 04/10/2016   LDLDIRECT 120.1 09/30/2013   TRIG 87.0 04/10/2016   CHOLHDL 3 04/10/2016    Foot exam done in 3/17, normal exam    Examination:     BP 122/68   Pulse 81   Ht 5\' 2"  (1.575 m)   Wt 183 lb (83 kg)   SpO2 98%   BMI 33.47 kg/m   Body mass index is 33.47 kg/m.    ASSESSMENT/ PLAN:  Diabetes  type 2  See history of present illness for detailed discussion of current diabetes management, blood sugar patterns and problems identified    A1c is higher at 7.5 now She has gained a little weight Most likely she is showing some progression of her diabetes and glucose is more difficult to control She usually tries to do fairly well with diet but has gained weight since last year and may not have exercise consistently in the last few months also  RECOMMENDATIONS: She will increase her Invokana, she can try taking 2 tablets together of the 100 and then go to the 300 mg prescription She can take this with food Advised her to call if blood sugars are started improving otherwise come back in 3 months for another A1c She needs to check more readings after meals especially at night  HYPERCHOLESTEROLEMIA:   She has recently better  control with Livalo, LDL now 112 and previously baseline LDL of around 200 She is able to tolerate this at 2 mg without any muscle aches She agrees to try 4 mg to see if he can get her LDL down below 100 She has minimal increase in ALT, however has had intercurrent illnesses and antibiotics also and will continue to follow  Blood pressure is  normal without medications now, likely to be controlled with Invokana   Patient Instructions  Check blood sugars on waking up    Also check blood sugars about 2 hours after a meal and do this after different meals by rotation  Recommended blood sugar levels on waking up is 90-130 and about 2 hours after meal is 130-160  Please bring your blood sugar monitor to each visit, thank you  Take 2 Invokana  With [place] 04/14/2016, 10:26 AM

## 2016-04-14 NOTE — Patient Instructions (Addendum)
Check blood sugars on waking up    Also check blood sugars about 2 hours after a meal and do this after different meals by rotation  Recommended blood sugar levels on waking up is 90-130 and about 2 hours after meal is 130-160  Please bring your blood sugar monitor to each visit, thank you  Take 2 Invokana  With Bfst

## 2016-05-03 ENCOUNTER — Encounter: Payer: Self-pay | Admitting: Oncology

## 2016-05-09 ENCOUNTER — Other Ambulatory Visit (HOSPITAL_BASED_OUTPATIENT_CLINIC_OR_DEPARTMENT_OTHER): Payer: Medicare Other

## 2016-05-09 ENCOUNTER — Ambulatory Visit (HOSPITAL_BASED_OUTPATIENT_CLINIC_OR_DEPARTMENT_OTHER): Payer: Medicare Other | Admitting: Oncology

## 2016-05-09 VITALS — BP 134/68 | HR 72 | Temp 98.0°F | Resp 18

## 2016-05-09 DIAGNOSIS — Z170 Estrogen receptor positive status [ER+]: Secondary | ICD-10-CM

## 2016-05-09 DIAGNOSIS — C50411 Malignant neoplasm of upper-outer quadrant of right female breast: Secondary | ICD-10-CM

## 2016-05-09 DIAGNOSIS — Z79811 Long term (current) use of aromatase inhibitors: Secondary | ICD-10-CM | POA: Diagnosis not present

## 2016-05-09 DIAGNOSIS — E119 Type 2 diabetes mellitus without complications: Secondary | ICD-10-CM

## 2016-05-09 LAB — COMPREHENSIVE METABOLIC PANEL
ALBUMIN: 3.6 g/dL (ref 3.5–5.0)
ALK PHOS: 78 U/L (ref 40–150)
ALT: 20 U/L (ref 0–55)
AST: 18 U/L (ref 5–34)
Anion Gap: 9 mEq/L (ref 3–11)
BUN: 21 mg/dL (ref 7.0–26.0)
CO2: 27 mEq/L (ref 22–29)
Calcium: 10.4 mg/dL (ref 8.4–10.4)
Chloride: 108 mEq/L (ref 98–109)
Creatinine: 1 mg/dL (ref 0.6–1.1)
EGFR: 64 mL/min/{1.73_m2} — ABNORMAL LOW (ref >90)
GLUCOSE: 124 mg/dL (ref 70–140)
POTASSIUM: 4.9 meq/L (ref 3.5–5.1)
SODIUM: 144 meq/L (ref 136–145)
TOTAL PROTEIN: 7.1 g/dL (ref 6.4–8.3)
Total Bilirubin: 0.61 mg/dL (ref 0.20–1.20)

## 2016-05-09 LAB — CBC WITH DIFFERENTIAL/PLATELET
BASO%: 0.3 % (ref 0.0–2.0)
Basophils Absolute: 0 10*3/uL (ref 0.0–0.1)
EOS%: 1.9 % (ref 0.0–7.0)
Eosinophils Absolute: 0.1 10*3/uL (ref 0.0–0.5)
HCT: 37.2 % (ref 34.8–46.6)
HEMOGLOBIN: 12.2 g/dL (ref 11.6–15.9)
LYMPH%: 36.6 % (ref 14.0–49.7)
MCH: 31.1 pg (ref 25.1–34.0)
MCHC: 32.8 g/dL (ref 31.5–36.0)
MCV: 94.9 fL (ref 79.5–101.0)
MONO#: 0.6 10*3/uL (ref 0.1–0.9)
MONO%: 9.9 % (ref 0.0–14.0)
NEUT%: 51.3 % (ref 38.4–76.8)
NEUTROS ABS: 3.2 10*3/uL (ref 1.5–6.5)
Platelets: 334 10*3/uL (ref 145–400)
RBC: 3.92 10*6/uL (ref 3.70–5.45)
RDW: 15.7 % — AB (ref 11.2–14.5)
WBC: 6.3 10*3/uL (ref 3.9–10.3)
lymph#: 2.3 10*3/uL (ref 0.9–3.3)

## 2016-05-09 MED ORDER — ANASTROZOLE 1 MG PO TABS: 1.0000 mg | ORAL_TABLET | Freq: Every day | ORAL | 4 refills | Status: DC

## 2016-05-09 NOTE — Progress Notes (Signed)
Amanda [name]  [phone] [phone]     ID: Amanda [name] DOB: [birth year]  MR#: [id number]  GYF#:[id number]  Patient Care Team: Antony Contras, MD as PCP - General (Family Medicine) Sylvan Cheese, NP as Nurse Practitioner (Hematology and Oncology) Chauncey Cruel, MD as Consulting Physician (Oncology) Thea Silversmith, MD (Inactive) as Consulting Physician (Radiation Oncology) Alphonsa Overall, MD as Consulting Physician (General Surgery) [name], MD as Consulting Physician (Endocrinology) PCP: Gara Kroner, MD GYN: OTHER MD:  CHIEF COMPLAINT: estrogen receptor positive breast cancer  CURRENT TREATMENT: anastrozole   BREAST CANCER HISTORY:  from the original intake note:  Amanda [name] had routine screening mammography at [name] 03/10/2015. The breast density was category B. There was a new cluster of nodules in the right breast at the 11:00 position measuring approximately 5 mm. Ultrasonography 03/15/2014 confirmed a 1.1 center meter oval mass in the right breast at the 10:00 position 6 cm from the nipple. There was a benign 0.8 cm simple cyst immediately adjacent to the mass. The right axilla was sonographically benign.  Biopsy of the right breast mass in question 03/22/2015 showed ([id number]) invasive ductal carcinoma, grade 1, estrogen receptor 95% positive, progesterone receptor 80% positive, both with strong staining intensity, with an MIB-1 of 15%, and no HER-2 amplification, the signals ratio being 1.58 and the number per cell 3.00.  Her subsequent history is as detailed below  INTERVAL HISTORY: Amanda [name] returns today for follow-up of her estrogen receptor positive breast cancer. She continues on anastrozole, now with good tolerance. She never developed the rash again that she experienced initially--that was probably an unrelated development.  She does have hot flashes but they are getting better. Vaginal dryness is present but not a  problem. It is not changed from baseline. Overall she feels she is doing "pretty good". With this medication and she is comfortable continuing it to a total of 5 years  REVIEW OF SYSTEMS: Amanda [name] has some soreness in her right breast when she sleeps on that side. Of course she has aches and pains here and there which are not more intense or persistent than before. Aside from these issues a detailed review of systems today was stable  PAST MEDICAL HISTORY: Past Medical History:  Diagnosis Date  . Arthritis   . Breast cancer ([place])   . Breast cancer of upper-outer quadrant of right female breast ([place]) 03/24/2015  . Carpal tunnel syndrome   . Diabetes mellitus without complication ([place])   . GERD (gastroesophageal reflux disease)    Pt takes OTC when needed.  Marland Kitchen Hot flashes     PAST SURGICAL HISTORY: Past Surgical History:  Procedure Laterality Date  . ABDOMINAL HYSTERECTOMY    . APPENDECTOMY    . COLONOSCOPY    . ESOPHAGOGASTRODUODENOSCOPY    . EYE SURGERY Bilateral    Cataracts removed  . JOINT REPLACEMENT    . RADIOACTIVE SEED GUIDED MASTECTOMY WITH AXILLARY SENTINEL LYMPH NODE BIOPSY Right 04/16/2015   Procedure: RADIOACTIVE SEED GUIDED PARTIAL MASTECTOMY WITH AXILLARY SENTINEL LYMPH NODE BIOPSY;  Surgeon: Alphonsa Overall, MD;  Location: [facility];  Service: General;  Laterality: Right;  . SHOULDER SURGERY Right   . TONSILLECTOMY    . TOTAL KNEE ARTHROPLASTY Right 07/18/2013   DR GRAVES  . TOTAL KNEE ARTHROPLASTY Right 07/18/2013   Procedure: TOTAL KNEE ARTHROPLASTY;  Surgeon: Alta Corning, MD;  Location: [facility];  Service: Orthopedics;  Laterality: Right;    FAMILY HISTORY No family history on  file. The patient's father died in his 58s from reasons unclear to the patient. Her mother died from heart disease at the age of 76. The patient had one brother, 5 sisters. There is no history of breast or ovarian cancer in the family to her knowledge.  GYNECOLOGIC HISTORY:  No  LMP recorded. Patient has had a hysterectomy. Menarche age 33, first live birth age 89. She is GX P2. She underwent hysterectomy with bilateral salpingo-oophorectomy in her 65s. 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 She is divorced and lives alone, with no pets. Son [name] lives in [place] where he manages an auto zone. Daughter [name] lives in [place]. She works for [employer]. The patient has 4 grandchildren. She is a Psychologist, forensic.    ADVANCED DIRECTIVES: Not in place   HEALTH MAINTENANCE: Social History  Substance Use Topics  . Smoking status: Never Smoker  . Smokeless tobacco: Never Used  . Alcohol use Yes     Comment: socially, occ weekends     Colonoscopy: 2016/ [facility]  PAP: Status post hysterectomy  Bone density: 2016/ normal/ [facility]  Lipid panel:  Allergies  Allergen Reactions  . Meperidine Hcl     REACTION: rash  . Statins Other (See Comments)    Muscle pain. Pt able to take Crestor but not everyday.    Current Outpatient Prescriptions  Medication Sig Dispense Refill  . anastrozole (ARIMIDEX) 1 MG tablet Take 1 tablet (1 mg total) by mouth daily. 90 tablet 4  . aspirin 81 MG tablet Take 81 mg by mouth daily.    . canagliflozin (INVOKANA) 300 MG TABS tablet Take 1 tablet (300 mg total) by mouth daily before breakfast. 30 tablet 3  . fexofenadine (ALLEGRA) 180 MG tablet Take 180 mg by mouth daily.    Marland Kitchen glucose blood (ONE TOUCH ULTRA TEST) test strip USE TO TEST BLOOD SUGAR ONE TIME DAILY AS INSTRUCTED Dx code E11.9 100 each 1  . hydrOXYzine (ATARAX/VISTARIL) 25 MG tablet Take 1 tablet (25 mg total) by mouth 3 (three) times daily as needed for itching. 30 tablet 1  . montelukast (SINGULAIR) 10 MG tablet Take 10 mg by mouth at bedtime. Reported on 08/20/2015    . Pitavastatin Calcium (LIVALO) 4 MG TABS Take one tablet daily 30 tablet 3  . ranitidine (ZANTAC) 150 MG  tablet Take 150 mg by mouth 2 (two) times daily.    . TRULICITY 0.98 JX/9.1YN SOPN INJECT 0.75 MG UNDER THE SKIN ONCE A WEEK 6 mL 1  . venlafaxine XR (EFFEXOR-XR) 37.5 MG 24 hr capsule Take 1 capsule (37.5 mg total) by mouth daily with breakfast. 30 capsule 4   No current facility-administered medications for this visit.     OBJECTIVE: Middle-aged African-American woman  In no acute distress Vitals:   05/09/16 1118  BP: 134/68  Pulse: 72  Resp: 18  Temp: 98 F (36.7 C)     There is no height or weight on file to calculate BMI.    ECOG FS:1 - Symptomatic but completely ambulatory  Sclerae unicteric, EOMs intact Oropharynx clear and moist No cervical or supraclavicular adenopathy Lungs no rales or rhonchi Heart regular rate and rhythm Abd soft, obese, nontender, positive bowel sounds MSK no focal spinal tenderness, no upper extremity lymphedema Neuro: nonfocal, well oriented, appropriate affect Breasts: The right breast is status post lumpectomy and radiation with no evidence of local recurrence.  The right axilla is benign. The left breast is unremarkable.    LAB RESULTS:  CMP     Component Value Date/Time   NA 144 05/09/2016 1108   K 4.9 05/09/2016 1108   CL 107 04/10/2016 1130   CO2 27 05/09/2016 1108   GLUCOSE 124 05/09/2016 1108   BUN 21.0 05/09/2016 1108   CREATININE 1.0 05/09/2016 1108   CALCIUM 10.4 05/09/2016 1108   PROT 7.1 05/09/2016 1108   ALBUMIN 3.6 05/09/2016 1108   AST 18 05/09/2016 1108   ALT 20 05/09/2016 1108   ALKPHOS 78 05/09/2016 1108   BILITOT 0.61 05/09/2016 1108   GFRNONAA >60 11/03/2015 1701   GFRAA >60 11/03/2015 1701    INo results found for: SPEP, UPEP  Lab Results  Component Value Date   WBC 6.3 05/09/2016   NEUTROABS 3.2 05/09/2016   HGB 12.2 05/09/2016   HCT 37.2 05/09/2016   MCV 94.9 05/09/2016   PLT 334 05/09/2016      Chemistry      Component Value Date/Time   NA 144 05/09/2016 1108   K 4.9 05/09/2016 1108   CL 107  04/10/2016 1130   CO2 27 05/09/2016 1108   BUN 21.0 05/09/2016 1108   CREATININE 1.0 05/09/2016 1108      Component Value Date/Time   CALCIUM 10.4 05/09/2016 1108   ALKPHOS 78 05/09/2016 1108   AST 18 05/09/2016 1108   ALT 20 05/09/2016 1108   BILITOT 0.61 05/09/2016 1108       No results found for: LABCA2  No components found for: LABCA125  No results for input(s): INR in the last 168 hours.  Urinalysis    Component Value Date/Time   COLORURINE YELLOW 06/29/2015 0000   APPEARANCEUR CLOUDY (A) 06/29/2015 0000   LABSPEC 1.031 (H) 06/29/2015 0000   PHURINE 5.0 06/29/2015 0000   GLUCOSEU >1000 (A) 06/29/2015 0000   GLUCOSEU NEGATIVE 04/29/2015 1010   HGBUR NEGATIVE 06/29/2015 0000   BILIRUBINUR NEGATIVE 06/29/2015 0000   KETONESUR NEGATIVE 06/29/2015 0000   PROTEINUR NEGATIVE 06/29/2015 0000   UROBILINOGEN 0.2 04/29/2015 1010   NITRITE NEGATIVE 06/29/2015 0000   LEUKOCYTESUR NEGATIVE 06/29/2015 0000      ELIGIBLE FOR AVAILABLE RESEARCH PROTOCOL: no  STUDIES: Mammography at [facility] 05/03/2016 from the breast density to be category B. There was no evidence of malignancy.  ASSESSMENT: 71 y.o. Amanda [name] woman status post right breast upper outer quadrant biopsy 03/22/2015 for a clinical T1CN0, stage IA invasive ductal carcinoma, grade 1, estrogen and progesterone receptor positive, HER-2 not amplified, with an MIB-1 of 15%.  (1)  Status post right lumpectomy and sentinel lymph node sampling 04/16/2015 for a pT1c pN0, stage IA  Invasive ductal carcinoma, grade 1, repeat HER-2 again negative  (2) Oncotype DX  Score of 12 predicts a risk of outside the breast recurrence of 8% within 10 years if the patient's only systemic therapy is tamoxifen for 5 years. It also predicts no benefit from chemotherapy.  (3) adjuvant radiation completed 07/19/2015   (4) started anastrozole 10/08/2015, interrupted because of a rash, restarted 11/29/2015  (a) DEXA scan 05/28/2014 was normal  with a T score of +0.7  PLAN: Amanda [name] is tolerating anastrozole remarkably well and the plan will be to continue that for a total of 5 years.  She is paying more for the anastrozole then most of my other patients. I suggest that she "shop it" and a for approximately $12 a month instead of $30 a month.  She had  a normal bone density at presentation. We can repeat that with her March mammogram next year.  At this point I feel comfortable seeing her on a yearly basis. She knows to call for any problems that may develop before her return.  :Chauncey Cruel, MD   05/09/2016 12:17 PM Medical Oncology and Hematology [facility] [place], [place] [address] Tel. [phone]    Fax. [phone]

## 2016-05-17 ENCOUNTER — Telehealth: Payer: Self-pay | Admitting: Oncology

## 2016-05-17 NOTE — Telephone Encounter (Signed)
Spoke with patient re 05/2017 appointments.

## 2016-07-10 ENCOUNTER — Other Ambulatory Visit (INDEPENDENT_AMBULATORY_CARE_PROVIDER_SITE_OTHER): Payer: Medicare Other

## 2016-07-10 ENCOUNTER — Other Ambulatory Visit: Payer: Self-pay | Admitting: Endocrinology

## 2016-07-10 DIAGNOSIS — E1165 Type 2 diabetes mellitus with hyperglycemia: Secondary | ICD-10-CM | POA: Diagnosis not present

## 2016-07-10 LAB — LIPID PANEL
CHOL/HDL RATIO: 3
CHOLESTEROL: 192 mg/dL (ref 0–200)
HDL: 63 mg/dL (ref >39.00)
LDL CALC: 110 mg/dL — AB (ref 0–99)
NonHDL: 129.11
Triglycerides: 98 mg/dL (ref 0.0–149.0)
VLDL: 19.6 mg/dL (ref 0.0–40.0)

## 2016-07-10 LAB — COMPREHENSIVE METABOLIC PANEL
ALK PHOS: 67 U/L (ref 39–117)
ALT: 17 U/L (ref 0–35)
AST: 19 U/L (ref 0–37)
Albumin: 4.2 g/dL (ref 3.5–5.2)
BILIRUBIN TOTAL: 0.5 mg/dL (ref 0.2–1.2)
BUN: 19 mg/dL (ref 6–23)
CALCIUM: 10.3 mg/dL (ref 8.4–10.5)
CO2: 27 meq/L (ref 19–32)
Chloride: 108 mEq/L (ref 96–112)
Creatinine, Ser: 0.88 mg/dL (ref 0.40–1.20)
GFR: 81.45 mL/min (ref >60.00)
GLUCOSE: 144 mg/dL — AB (ref 70–99)
POTASSIUM: 4.5 meq/L (ref 3.5–5.1)
Sodium: 142 mEq/L (ref 135–145)
Total Protein: 7 g/dL (ref 6.0–8.3)

## 2016-07-10 LAB — HEMOGLOBIN A1C: HEMOGLOBIN A1C: 6.7 % — AB (ref 4.6–6.5)

## 2016-07-13 ENCOUNTER — Telehealth: Payer: Self-pay | Admitting: Family Medicine

## 2016-07-13 NOTE — Telephone Encounter (Signed)
Left detailed vm and requested a call back if she had any further questions

## 2016-07-13 NOTE — Telephone Encounter (Signed)
Patient wants to know if it's ok to wait until Tuesday the 12th for [facility] to deliver her Trulicity, or if she should request a 30 day supply through [facility]?  She is out of Trulicity but she is feeling fine.  Please advise.  Thank you,  -LL

## 2016-07-13 NOTE — Telephone Encounter (Signed)
Ok to wait

## 2016-07-14 ENCOUNTER — Ambulatory Visit: Payer: Medicare Other | Admitting: Endocrinology

## 2016-07-16 ENCOUNTER — Other Ambulatory Visit: Payer: Self-pay | Admitting: Endocrinology

## 2016-07-17 ENCOUNTER — Ambulatory Visit (INDEPENDENT_AMBULATORY_CARE_PROVIDER_SITE_OTHER): Payer: Medicare Other | Admitting: Endocrinology

## 2016-07-17 ENCOUNTER — Encounter: Payer: Self-pay | Admitting: Endocrinology

## 2016-07-17 VITALS — BP 116/68 | HR 68 | Ht 62.0 in | Wt 187.2 lb

## 2016-07-17 DIAGNOSIS — E1165 Type 2 diabetes mellitus with hyperglycemia: Secondary | ICD-10-CM | POA: Diagnosis not present

## 2016-07-17 NOTE — Patient Instructions (Signed)
Check blood sugars on waking up  2-3/7  Also check blood sugars about 2 hours after a meal and do this after different meals by rotation  Recommended blood sugar levels on waking up is 90-130 and about 2 hours after meal is 130-160  Please bring your blood sugar monitor to each visit, thank you   Walk daily

## 2016-07-17 NOTE — Progress Notes (Signed)
Amanda [name] is an 71 y.o. female.             Reason for Appointment: Diabetes follow-up   Initial diagnosis of diabetes:?  2007   History of Present Illness   Diagnosis: Type 2 DIABETES MELITUS     Previous history: She has had difficulty with blood sugar control initially because of limited tolerability with metformin and difficulty with weight loss. She did very well with starting Victoza which has helped her blood sugar control as well as some improvement in her obesity Her insurance company did not prefer Victoza and she was switched to Trulicity Subsequently her glipizide was stopped She was started on Invokana 100 mg daily in addition to her Trulicity 4.26 mg when her blood sugars were higher in 2017  Recent history:    Her A1c is better at 6.7, previously was higher at 7.5   Current blood sugar patterns, management and problems identified:   On her last visit her Invokana was increased up to 300 mg daily  She has checked a few blood sugars at home but mostly fasting  Fasting readings are mildly increased overall  On her last visit she was having other medical issues and steroid injections possibly causing higher readings  She has not done any readings after supper  Lab glucose was 144 ?  Fasting  Diet has been fair, not always consistent and she thinks that she is not getting enough satiety from Trulicity, also may not watch higher fat foods at times     Oral hypoglycemic drugs:   Invokana 834 mg daily  Trulicity 1.96 mg weekly      Side effects from medications: Diarrhea from metformin.  Nausea from 1.5 mg Trulicity         Monitors blood glucose:  less than Once a day.    Glucometer: One Touch ultra 2              Blood Glucose readings FASTING 124-139 with median 126, lunchtime 103   Physical activity: exercise: walking on treadmill 2-3 days upto 60 min            Wt Readings from Last 3 Encounters:  07/17/16 187 lb 3.2 oz (84.9 kg)  04/14/16 183 lb  (83 kg)  11/29/15 186 lb 11.2 oz (84.7 kg)    LABS:  Lab Results  Component Value Date   HGBA1C 6.7 (H) 07/10/2016   HGBA1C 7.5 (H) 04/10/2016   HGBA1C 6.6 (H) 08/16/2015   Lab Results  Component Value Date   MICROALBUR <0.7 04/10/2016   LDLCALC 110 (H) 07/10/2016   CREATININE 0.88 07/10/2016     No visits with results within 1 Week(s) from this visit.  Latest known visit with results is:  Lab on 07/10/2016  Component Date Value Ref Range Status  . Hgb A1c MFr Bld 07/10/2016 6.7* 4.6 - 6.5 % Final   Glycemic Control Guidelines for People with Diabetes:Non Diabetic:  <6%Goal of Therapy: <7%Additional Action Suggested:  >8%   . Sodium 07/10/2016 142  135 - 145 mEq/L Final  . Potassium 07/10/2016 4.5  3.5 - 5.1 mEq/L Final  . Chloride 07/10/2016 108  96 - 112 mEq/L Final  . CO2 07/10/2016 27  19 - 32 mEq/L Final  . Glucose, Bld 07/10/2016 144* 70 - 99 mg/dL Final  . BUN 07/10/2016 19  6 - 23 mg/dL Final  . Creatinine, Ser 07/10/2016 0.88  0.40 - 1.20 mg/dL Final  . Total Bilirubin 07/10/2016 0.5  0.2 - 1.2 mg/dL Final  . Alkaline Phosphatase 07/10/2016 67  39 - 117 U/L Final  . AST 07/10/2016 19  0 - 37 U/L Final  . ALT 07/10/2016 17  0 - 35 U/L Final  . Total Protein 07/10/2016 7.0  6.0 - 8.3 g/dL Final  . Albumin 07/10/2016 4.2  3.5 - 5.2 g/dL Final  . Calcium 07/10/2016 10.3  8.4 - 10.5 mg/dL Final  . GFR 07/10/2016 81.45  >60.00 mL/min Final  . Cholesterol 07/10/2016 192  0 - 200 mg/dL Final   ATP III Classification       Desirable:  < 200 mg/dL               Borderline High:  200 - 239 mg/dL          High:  > = 240 mg/dL  . Triglycerides 07/10/2016 98.0  0.0 - 149.0 mg/dL Final   Normal:  <150 mg/dLBorderline High:  150 - 199 mg/dL  . HDL 07/10/2016 63.00  >39.00 mg/dL Final  . VLDL 07/10/2016 19.6  0.0 - 40.0 mg/dL Final  . LDL Cholesterol 07/10/2016 110* 0 - 99 mg/dL Final  . Total CHOL/HDL Ratio 07/10/2016 3   Final                  Men          Women1/2 Average  Risk     3.4          3.3Average Risk          5.0          4.42X Average Risk          9.6          7.13X Average Risk          15.0          11.0                      . NonHDL 07/10/2016 129.11   Final   NOTE:  Non-HDL goal should be 30 mg/dL higher than patient's LDL goal (i.e. LDL goal of < 70 mg/dL, would have non-HDL goal of < 100 mg/dL)    Allergies as of 07/17/2016      Reactions   Meperidine Hcl    REACTION: rash   Statins Other (See Comments)   Muscle pain. Pt able to take Crestor but not everyday.      Medication List       Accurate as of 07/17/16  9:33 PM. Always use your most recent med list.          anastrozole 1 MG tablet Commonly known as:  ARIMIDEX Take 1 tablet (1 mg total) by mouth daily.   aspirin 81 MG tablet Take 81 mg by mouth daily.   canagliflozin 300 MG Tabs tablet Commonly known as:  INVOKANA Take 1 tablet (300 mg total) by mouth daily before breakfast.   fexofenadine 180 MG tablet Commonly known as:  ALLEGRA Take 180 mg by mouth daily.   hydrOXYzine 25 MG tablet Commonly known as:  ATARAX/VISTARIL Take 1 tablet (25 mg total) by mouth 3 (three) times daily as needed for itching.   montelukast 10 MG tablet Commonly known as:  SINGULAIR Take 10 mg by mouth at bedtime. Reported on 08/20/2015   ONE TOUCH ULTRA TEST test strip Generic drug:  glucose blood USE TO TEST BLOOD SUGAR ONE TIME DAILY AS INSTRUCTED   Pitavastatin Calcium 4  MG Tabs Commonly known as:  LIVALO Take one tablet daily   ranitidine 150 MG tablet Commonly known as:  ZANTAC Take 150 mg by mouth 2 (two) times daily.   TRULICITY 8.93 TD/4.2AJ Sopn Generic drug:  Dulaglutide INJECT 0.75 MG UNDER THE SKIN ONCE A WEEK   venlafaxine XR 37.5 MG 24 hr capsule Commonly known as:  EFFEXOR-XR Take 1 capsule (37.5 mg total) by mouth daily with breakfast.   Vitamin D3 2000 units Tabs Take by mouth. Takes 3 daily       Allergies:  Allergies  Allergen Reactions  .  Meperidine Hcl     REACTION: rash  . Statins Other (See Comments)    Muscle pain. Pt able to take Crestor but not everyday.    Past Medical History:  Diagnosis Date  . Arthritis   . Breast cancer ([place])   . Breast cancer of upper-outer quadrant of right female breast ([place]) 03/24/2015  . Carpal tunnel syndrome   . Diabetes mellitus without complication ([place])   . GERD (gastroesophageal reflux disease)    Pt takes OTC when needed.  Marland Kitchen Hot flashes     Past Surgical History:  Procedure Laterality Date  . ABDOMINAL HYSTERECTOMY    . APPENDECTOMY    . COLONOSCOPY    . ESOPHAGOGASTRODUODENOSCOPY    . EYE SURGERY Bilateral    Cataracts removed  . JOINT REPLACEMENT    . RADIOACTIVE SEED GUIDED MASTECTOMY WITH AXILLARY SENTINEL LYMPH NODE BIOPSY Right 04/16/2015   Procedure: RADIOACTIVE SEED GUIDED PARTIAL MASTECTOMY WITH AXILLARY SENTINEL LYMPH NODE BIOPSY;  Surgeon: Alphonsa Overall, MD;  Location: [facility];  Service: General;  Laterality: Right;  . SHOULDER SURGERY Right   . TONSILLECTOMY    . TOTAL KNEE ARTHROPLASTY Right 07/18/2013   DR GRAVES  . TOTAL KNEE ARTHROPLASTY Right 07/18/2013   Procedure: TOTAL KNEE ARTHROPLASTY;  Surgeon: Alta Corning, MD;  Location: [facility];  Service: Orthopedics;  Laterality: Right;    No family history on file.  Social History:  reports that she has never smoked. She has never used smokeless tobacco. She reports that she drinks alcohol. She reports that she does not use drugs.  Review of Systems:  Lipids:  She was switched from Crestor to Livalo because of her difficulty tolerating this because of joint pains. Now taking Livalo 4 mg daily without any joint pains However her LDL has not improved with increasing the dose Previously baseline LDL has been as high as 200 She thinks she can do better on diet    Lab Results  Component Value Date   CHOL 192 07/10/2016   HDL 63.00 07/10/2016   LDLCALC 110 (H) 07/10/2016   LDLDIRECT 120.1  09/30/2013   TRIG 98.0 07/10/2016   CHOLHDL 3 07/10/2016    Foot exam done in 6/18, normal exam    Examination:     BP 116/68   Pulse 68   Ht 5\' 2"  (1.575 m)   Wt 187 lb 3.2 oz (84.9 kg)   SpO2 96%   BMI 34.24 kg/m   Body mass index is 34.24 kg/m.   Diabetic Foot Exam - Simple   Simple Foot Form Diabetic Foot exam was performed with the following findings:  Yes   Visual Inspection No deformities, no ulcerations, no other skin breakdown bilaterally:  Yes Sensation Testing Intact to touch and monofilament testing bilaterally:  Yes Pulse Check Posterior Tibialis and Dorsalis pulse intact bilaterally:  Yes Comments  Minimal pedal edema present  ASSESSMENT/ PLAN:  Diabetes type 2  See history of present illness for detailed discussion of current diabetes management, blood sugar patterns and problems identified  A1c is better with increasing her Invokana She has gained a little weight again She thinks she cannot control her portions as well and may not be making good choices even with taking Trulicity, however cannot tolerate the 1.5 mg dose  RECOMMENDATIONS: She was given a list of low fat food options to use to cut back on her calorie intake May want to consider Ozempic on her next visit if this is covered More readings after meals to help compliance with diet Consistent walking  HYPERCHOLESTEROLEMIA:    Even with 4 mg of Livalo her LDL is over 100 For now she will try to follow the low saturated fat diet given today    Patient Instructions  Check blood sugars on waking up  2-3/7  Also check blood sugars about 2 hours after a meal and do this after different meals by rotation  Recommended blood sugar levels on waking up is 90-130 and about 2 hours after meal is 130-160  Please bring your blood sugar monitor to each visit, thank you   Walk daily    Toshia Larkin 07/17/2016, 9:33 PM

## 2016-08-30 ENCOUNTER — Other Ambulatory Visit: Payer: Self-pay

## 2016-08-30 MED ORDER — GABAPENTIN 300 MG PO CAPS: 300.0000 mg | ORAL_CAPSULE | Freq: Every day | ORAL | 0 refills | Status: DC

## 2016-09-13 ENCOUNTER — Other Ambulatory Visit: Payer: Self-pay | Admitting: Endocrinology

## 2016-09-19 LAB — HM DIABETES EYE EXAM

## 2016-10-03 ENCOUNTER — Telehealth: Payer: Self-pay

## 2016-10-03 NOTE — Telephone Encounter (Signed)
Pt called that she has been taking 300 mg gabapentin qhs for the last 30 days. She is still having hot flashes and night sweats. She is not having any side effects to the gabapentin. What is the next course of action?

## 2016-10-04 NOTE — Telephone Encounter (Signed)
This RN returned call to patient to inquire further regarding her concerns.  Obtained VM - message left to return call to this RN.

## 2016-10-06 ENCOUNTER — Other Ambulatory Visit: Payer: Self-pay | Admitting: *Deleted

## 2016-10-06 MED ORDER — GABAPENTIN 300 MG PO CAPS: 600.0000 mg | ORAL_CAPSULE | Freq: Every day | ORAL | 0 refills | Status: DC

## 2016-10-06 MED ORDER — CLONIDINE HCL 0.1 MG/24HR TD PTWK: 0.1000 mg | MEDICATED_PATCH | TRANSDERMAL | 1 refills | Status: DC

## 2016-10-06 NOTE — Telephone Encounter (Signed)
This RN spoke with pt per her call stating hot flashes not controled well with gabapentin.  Amanda [name] stated concern of " since my hot flashes are so intense could it be cancer ?"  This RN informed pt - hot flashes are likely  secondary to the arimidex. ( pt no other symptoms ).  Discussed causes of hot flashes and how they can be managed medically - discussed use of gabapentin, catapres and anti depressant.  Per discussion- pt would like to increase the gabapentin dose to 600 mg nightly.  Catapres patches will be called in - and if affordable she will use if above dosing of gabapentin not beneficial.  Note pt is already instituting other interventions - cool room - fan for air movement as well as light cotton clothing for sleepwear.  Amanda [name] will call this office next week with up date

## 2016-10-17 ENCOUNTER — Telehealth: Payer: Self-pay | Admitting: Endocrinology

## 2016-10-17 ENCOUNTER — Other Ambulatory Visit: Payer: Self-pay

## 2016-10-17 MED ORDER — DULAGLUTIDE 0.75 MG/0.5ML ~~LOC~~ SOAJ: SUBCUTANEOUS | 1 refills | Status: DC

## 2016-10-17 NOTE — Telephone Encounter (Signed)
MEDICATION: TRULICITY 2.92 TG/9.0BO SOPN  PHARMACY:   [id number] - [place] - [address] [phone] (Phone) [phone] (Fax)     IS THIS A 90 DAY SUPPLY : N (patient wants 30 day supply)  IS PATIENT OUT OF MEDICATION: Y  IF NOT; HOW MUCH IS LEFT:   LAST APPOINTMENT DATE: 07/17/16  NEXT APPOINTMENT DATE: 11/16/16  OTHER COMMENTS:    **Let patient know to contact pharmacy at the end of the day to make sure medication is ready. **  ** Please notify patient to allow 48-72 hours to process**  **Encourage patient to contact the pharmacy for refills or they can request refills through [facility]**

## 2016-10-17 NOTE — Telephone Encounter (Signed)
Already called patient and left a voice message to let her know this has been sent to the pharmacy.

## 2016-10-17 NOTE — Telephone Encounter (Signed)
Called patient and left a voice message to let her know that I have sent in her Trulicity to the [facility] for her for a 30 day supply.

## 2016-10-17 NOTE — Telephone Encounter (Signed)
Patient called to ask if the nursing staff can call  [place], [place] [phone] (Phone) [phone] (Fax)   Concerning her  TRULICITY 7.34 LP/3.7TK SOPN. Call suddenly ended. Please advise.

## 2016-11-07 ENCOUNTER — Telehealth: Payer: Self-pay | Admitting: *Deleted

## 2016-11-07 NOTE — Telephone Encounter (Signed)
Message left by pt requesting a return call " I have a question about the gabapentin ".  Return call number given as [phone].  This RN returned call and obtained VM of pt.  Message left requesting pt to call if able to speak directly to the Triage nurse who can rely her concerns.

## 2016-11-13 ENCOUNTER — Other Ambulatory Visit: Payer: Medicare Other

## 2016-11-16 ENCOUNTER — Ambulatory Visit: Payer: Medicare Other | Admitting: Endocrinology

## 2016-11-22 ENCOUNTER — Telehealth: Payer: Self-pay | Admitting: Endocrinology

## 2016-11-22 NOTE — Telephone Encounter (Signed)
Called patient and let her know that I do not see that she received a flu shot from [place] for this season. I have put her on the nurse visit schedule on Tuesday, 11/28/2016 at 9:30am to receive her flu shot.

## 2016-11-22 NOTE — Telephone Encounter (Signed)
Patient wants to know if she has had the Flu Shot this season. Please call her at [phone] to advise.

## 2016-11-28 ENCOUNTER — Encounter: Payer: Self-pay | Admitting: Endocrinology

## 2016-11-28 ENCOUNTER — Ambulatory Visit (INDEPENDENT_AMBULATORY_CARE_PROVIDER_SITE_OTHER): Payer: Medicare Other

## 2016-11-28 DIAGNOSIS — Z23 Encounter for immunization: Secondary | ICD-10-CM | POA: Diagnosis not present

## 2016-12-21 ENCOUNTER — Other Ambulatory Visit (INDEPENDENT_AMBULATORY_CARE_PROVIDER_SITE_OTHER): Payer: Medicare Other

## 2016-12-21 DIAGNOSIS — E1165 Type 2 diabetes mellitus with hyperglycemia: Secondary | ICD-10-CM | POA: Diagnosis not present

## 2016-12-21 LAB — BASIC METABOLIC PANEL
BUN: 14 mg/dL (ref 6–23)
CALCIUM: 10.8 mg/dL — AB (ref 8.4–10.5)
CO2: 29 mEq/L (ref 19–32)
CREATININE: 0.74 mg/dL (ref 0.40–1.20)
Chloride: 105 mEq/L (ref 96–112)
GFR: 99.35 mL/min (ref >60.00)
GLUCOSE: 107 mg/dL — AB (ref 70–99)
Potassium: 4.5 mEq/L (ref 3.5–5.1)
Sodium: 140 mEq/L (ref 135–145)

## 2016-12-21 LAB — HEMOGLOBIN A1C: Hgb A1c MFr Bld: 6.5 % (ref 4.6–6.5)

## 2016-12-25 ENCOUNTER — Ambulatory Visit (INDEPENDENT_AMBULATORY_CARE_PROVIDER_SITE_OTHER): Payer: Medicare Other | Admitting: Endocrinology

## 2016-12-25 ENCOUNTER — Encounter: Payer: Self-pay | Admitting: Endocrinology

## 2016-12-25 VITALS — BP 132/72 | HR 73 | Ht 62.0 in | Wt 178.6 lb

## 2016-12-25 DIAGNOSIS — E1165 Type 2 diabetes mellitus with hyperglycemia: Secondary | ICD-10-CM | POA: Diagnosis not present

## 2016-12-25 DIAGNOSIS — E7800 Pure hypercholesterolemia, unspecified: Secondary | ICD-10-CM

## 2016-12-25 NOTE — Progress Notes (Signed)
Amanda [name] is an 71 y.o. female.             Reason for Appointment: Diabetes follow-up   Initial diagnosis of diabetes:?  2007   History of Present Illness   Diagnosis: Type 2 DIABETES MELITUS     Previous history: She has had difficulty with blood sugar control initially because of limited tolerability with metformin and difficulty with weight loss. She did very well with starting Victoza which has helped her blood sugar control as well as some improvement in her obesity Her insurance company did not prefer Victoza and she was switched to Trulicity Subsequently her glipizide was stopped She was started on Invokana 100 mg daily in addition to her Trulicity 4.31 mg when her blood sugars were higher in 2017  Recent history:    Her A1c is better at 6.5, previously was higher at 7.5   Current blood sugar patterns, management and problems identified:   She continues to be benefiting from her increased doses of Invega, now 300 mg and A1c is even better  However she is also appearing to be more committed to trying to watch her diet and portions and has lost 9 pounds since her last visit in June  As before she is checking her blood sugars very sporadically and only 5 times in the last month  Blood sugar range 107-138 and no readings after supper  She is still trying to walk on her treadmill at home 3 days a week for up to an hour  No side effects are minimal from Trulicity and , Invokana     Oral hypoglycemic drugs:   Invokana 540 mg daily  Trulicity 0.86 mg weekly      Side effects from medications: Diarrhea from metformin.  Nausea from 1.5 mg Trulicity         Monitors blood glucose:  less than Once a day.    Glucometer: One Touch ultra 2              Blood Glucose readings as above, average 121  Physical activity: exercise: walking on treadmill 3 days upto 60 min            Wt Readings from Last 3 Encounters:  12/25/16 178 lb 9.6 oz (81 kg)  07/17/16 187 lb 3.2 oz  (84.9 kg)  04/14/16 183 lb (83 kg)    LABS:  Lab Results  Component Value Date   HGBA1C 6.5 12/21/2016   HGBA1C 6.7 (H) 07/10/2016   HGBA1C 7.5 (H) 04/10/2016   Lab Results  Component Value Date   MICROALBUR <0.7 04/10/2016   LDLCALC 110 (H) 07/10/2016   CREATININE 0.74 12/21/2016     Lab on 12/21/2016  Component Date Value Ref Range Status  . Sodium 12/21/2016 140  135 - 145 mEq/L Final  . Potassium 12/21/2016 4.5  3.5 - 5.1 mEq/L Final  . Chloride 12/21/2016 105  96 - 112 mEq/L Final  . CO2 12/21/2016 29  19 - 32 mEq/L Final  . Glucose, Bld 12/21/2016 107* 70 - 99 mg/dL Final  . BUN 12/21/2016 14  6 - 23 mg/dL Final  . Creatinine, Ser 12/21/2016 0.74  0.40 - 1.20 mg/dL Final  . Calcium 12/21/2016 10.8* 8.4 - 10.5 mg/dL Final  . GFR 12/21/2016 99.35  >60.00 mL/min Final  . Hgb A1c MFr Bld 12/21/2016 6.5  4.6 - 6.5 % Final   Glycemic Control Guidelines for People with Diabetes:Non Diabetic:  <6%Goal of Therapy: <7%Additional Action Suggested:  >  8%     Allergies as of 12/25/2016      Reactions   Meperidine Hcl    REACTION: rash   Statins Other (See Comments)   Muscle pain. Pt able to take Crestor but not everyday.      Medication List        Accurate as of 12/25/16  4:08 PM. Always use your most recent med list.          anastrozole 1 MG tablet Commonly known as:  ARIMIDEX Take 1 tablet (1 mg total) by mouth daily.   aspirin 81 MG tablet Take 81 mg by mouth daily.   cloNIDine 0.1 mg/24hr patch Commonly known as:  CATAPRES-TTS-1 Place 1 patch (0.1 mg total) onto the skin once a week.   Dulaglutide 0.75 MG/0.5ML Sopn Commonly known as:  TRULICITY INJECT 1.61 MG UNDER THE SKIN ONCE A WEEK   fexofenadine 180 MG tablet Commonly known as:  ALLEGRA Take 180 mg by mouth daily.   gabapentin 300 MG capsule Commonly known as:  NEURONTIN Take 2 capsules (600 mg total) by mouth at bedtime.   INVOKANA 300 MG Tabs tablet Generic drug:  canagliflozin take 1  tablet by mouth once daily BEFORE REAKFAST   montelukast 10 MG tablet Commonly known as:  SINGULAIR Take 10 mg by mouth at bedtime. Reported on 08/20/2015   ONE TOUCH ULTRA TEST test strip Generic drug:  glucose blood USE TO TEST BLOOD SUGAR ONE TIME DAILY AS INSTRUCTED   Pitavastatin Calcium 4 MG Tabs Commonly known as:  LIVALO Take one tablet daily   ranitidine 150 MG tablet Commonly known as:  ZANTAC Take 150 mg by mouth 2 (two) times daily.   Vitamin D3 2000 units Tabs Take by mouth. Takes 3 daily       Allergies:  Allergies  Allergen Reactions  . Meperidine Hcl     REACTION: rash  . Statins Other (See Comments)    Muscle pain. Pt able to take Crestor but not everyday.    Past Medical History:  Diagnosis Date  . Arthritis   . Breast cancer ([place])   . Breast cancer of upper-outer quadrant of right female breast ([place]) 03/24/2015  . Carpal tunnel syndrome   . Diabetes mellitus without complication ([place])   . GERD (gastroesophageal reflux disease)    Pt takes OTC when needed.  Marland Kitchen Hot flashes     Past Surgical History:  Procedure Laterality Date  . ABDOMINAL HYSTERECTOMY    . APPENDECTOMY    . COLONOSCOPY    . ESOPHAGOGASTRODUODENOSCOPY    . EYE SURGERY Bilateral    Cataracts removed  . JOINT REPLACEMENT    . RADIOACTIVE SEED GUIDED PARTIAL MASTECTOMY WITH AXILLARY SENTINEL LYMPH NODE BIOPSY Right 04/16/2015   Performed by Alphonsa Overall, MD at [facility]  . SHOULDER SURGERY Right   . TONSILLECTOMY    . TOTAL KNEE ARTHROPLASTY Right 07/18/2013   DR GRAVES  . TOTAL KNEE ARTHROPLASTY Right 07/18/2013   Performed by Alta Corning, MD at [facility] OR    No family history on file.  Social History:  reports that  has never smoked. she has never used smokeless tobacco. She reports that she drinks alcohol. She reports that she does not use drugs.  Review of Systems:  Lipids:  She was switched from Crestor to Livalo because of her difficulty tolerating  this because of joint pains. Now taking Livalo 4 mg and she tries to take it every 2-3  days, she thinks it is tolerable but limited by occurrence of joint pains  Previously baseline LDL has been as high as 200 Recently has lost weight    Lab Results  Component Value Date   CHOL 192 07/10/2016   HDL 63.00 07/10/2016   LDLCALC 110 (H) 07/10/2016   LDLDIRECT 120.1 09/30/2013   TRIG 98.0 07/10/2016   CHOLHDL 3 07/10/2016    Foot exam done in 6/18, normal exam  Eye exam 10/18, no abnormalities found and she will have report faxed to [place]    Examination:     BP 132/72   Pulse 73   Ht 5\' 2"  (1.575 m)   Wt 178 lb 9.6 oz (81 kg)   SpO2 97%   BMI 32.67 kg/m   Body mass index is 32.67 kg/m.    Minimal pedal edema present  ASSESSMENT/ PLAN:  Diabetes type 2  See history of present illness for detailed discussion of current diabetes management, blood sugar patterns and problems identified  Her blood sugars are well controlled now with A1c 6.5 She is also doing well with improving her diet and losing weight, probably benefiting from continuing a higher dose of Invokana along with her Trulicity Previously had difficulty with satiety with even taking Trulicity  No changes recommended and encouraged her to continue her efforts that have been successful She can try to check more readings after evening meal also  HYPERCHOLESTEROLEMIA:    She has done well with weight loss and improved diet and will need to recheck her lipids on the next visit She can continue taking Livalo as much as tolerated    Patient Instructions  More sugars after dinner    Amanda [name] 12/25/2016, 4:08 PM

## 2016-12-25 NOTE — Patient Instructions (Signed)
More sugars after dinner

## 2017-01-02 ENCOUNTER — Other Ambulatory Visit: Payer: Self-pay

## 2017-01-02 MED ORDER — CLONIDINE HCL 0.1 MG/24HR TD PTWK: 0.1000 mg | MEDICATED_PATCH | TRANSDERMAL | 12 refills | Status: DC

## 2017-02-05 ENCOUNTER — Other Ambulatory Visit: Payer: Self-pay | Admitting: Endocrinology

## 2017-02-07 ENCOUNTER — Other Ambulatory Visit: Payer: Self-pay | Admitting: Endocrinology

## 2017-04-16 ENCOUNTER — Other Ambulatory Visit: Payer: Self-pay | Admitting: Orthopedic Surgery

## 2017-04-18 ENCOUNTER — Other Ambulatory Visit: Payer: Medicare Other

## 2017-04-24 ENCOUNTER — Ambulatory Visit: Payer: Medicare Other | Admitting: Endocrinology

## 2017-05-14 LAB — HM DIABETES EYE EXAM

## 2017-05-15 ENCOUNTER — Other Ambulatory Visit: Payer: Medicare Other

## 2017-05-15 ENCOUNTER — Ambulatory Visit: Payer: Medicare Other | Admitting: Oncology

## 2017-05-16 ENCOUNTER — Other Ambulatory Visit (INDEPENDENT_AMBULATORY_CARE_PROVIDER_SITE_OTHER): Payer: Medicare Other

## 2017-05-16 DIAGNOSIS — E1165 Type 2 diabetes mellitus with hyperglycemia: Secondary | ICD-10-CM

## 2017-05-16 LAB — COMPREHENSIVE METABOLIC PANEL
ALBUMIN: 3.9 g/dL (ref 3.5–5.2)
ALK PHOS: 69 U/L (ref 39–117)
ALT: 12 U/L (ref 0–35)
AST: 15 U/L (ref 0–37)
BUN: 15 mg/dL (ref 6–23)
CHLORIDE: 105 meq/L (ref 96–112)
CO2: 28 mEq/L (ref 19–32)
Calcium: 9.7 mg/dL (ref 8.4–10.5)
Creatinine, Ser: 0.78 mg/dL (ref 0.40–1.20)
GFR: 93.39 mL/min (ref >60.00)
Glucose, Bld: 114 mg/dL — ABNORMAL HIGH (ref 70–99)
POTASSIUM: 4.2 meq/L (ref 3.5–5.1)
SODIUM: 141 meq/L (ref 135–145)
TOTAL PROTEIN: 6.9 g/dL (ref 6.0–8.3)
Total Bilirubin: 0.6 mg/dL (ref 0.2–1.2)

## 2017-05-16 LAB — MICROALBUMIN / CREATININE URINE RATIO
Creatinine,U: 65.9 mg/dL
Microalb Creat Ratio: 1.1 mg/g (ref 0.0–30.0)

## 2017-05-16 LAB — LIPID PANEL
CHOL/HDL RATIO: 3
Cholesterol: 216 mg/dL — ABNORMAL HIGH (ref 0–200)
HDL: 66.6 mg/dL (ref >39.00)
LDL Cholesterol: 139 mg/dL — ABNORMAL HIGH (ref 0–99)
NONHDL: 149.53
Triglycerides: 53 mg/dL (ref 0.0–149.0)
VLDL: 10.6 mg/dL (ref 0.0–40.0)

## 2017-05-16 LAB — HEMOGLOBIN A1C: HEMOGLOBIN A1C: 6.8 % — AB (ref 4.6–6.5)

## 2017-05-17 ENCOUNTER — Encounter: Payer: Self-pay | Admitting: Oncology

## 2017-05-18 ENCOUNTER — Ambulatory Visit (INDEPENDENT_AMBULATORY_CARE_PROVIDER_SITE_OTHER): Payer: Medicare Other | Admitting: Endocrinology

## 2017-05-18 ENCOUNTER — Encounter: Payer: Self-pay | Admitting: Endocrinology

## 2017-05-18 VITALS — BP 124/68 | HR 70 | Ht 62.0 in | Wt 172.8 lb

## 2017-05-18 DIAGNOSIS — E1165 Type 2 diabetes mellitus with hyperglycemia: Secondary | ICD-10-CM

## 2017-05-18 MED ORDER — DULAGLUTIDE 0.75 MG/0.5ML ~~LOC~~ SOAJ: SUBCUTANEOUS | 1 refills | Status: DC

## 2017-05-18 NOTE — Progress Notes (Signed)
Amanda [name] is an 72 y.o. female.             Reason for Appointment: Diabetes follow-up   Initial diagnosis of diabetes:?  2007   History of Present Illness   Diagnosis: Type 2 DIABETES MELITUS     Previous history: She has had difficulty with blood sugar control initially because of limited tolerability with metformin and difficulty with weight loss. She did very well with starting Victoza which has helped her blood sugar control as well as some improvement in her obesity Her insurance company did not prefer Victoza and she was switched to Trulicity Subsequently her glipizide was stopped She was started on Invokana 100 mg daily in addition to her Trulicity 0.34 mg when her blood sugars were higher in 2017  Recent history:    Her A1c is fairly stable under 7% 3 times now, recently 6.8 compared to 6.5 before   Current blood sugar patterns, management and problems identified:   She was last seen in 11/18  Has lost some weight since her last visit and is fairly motivated to do so  She is usually trying to watch her portions, calories and fats  Is trying to exercise with walking regularly  Although she is not has continued to try exercising regularly with walking  No side effects from Trulicity, may have only mild transient nausea  Also no yeast infections or increased urination with Invokana  Not able to determine what her postprandial readings are since she has only checked a few morning readings     Oral hypoglycemic drugs:   Invokana 742 mg daily  Trulicity 5.95 mg weekly      Side effects from medications: Diarrhea from metformin.  Nausea from 1.5 mg Trulicity         Monitors blood glucose:  less than Once a day.    Glucometer: One Touch ultra 2              Blood Glucose readings symptoms in the morning with a range of 107-124, recently only 4 readings  Physical activity: exercise: walking on treadmill 3-4 days upto 60 min            Wt Readings from Last 3  Encounters:  05/18/17 172 lb 12.8 oz (78.4 kg)  12/25/16 178 lb 9.6 oz (81 kg)  07/17/16 187 lb 3.2 oz (84.9 kg)    LABS:  Lab Results  Component Value Date   HGBA1C 6.8 (H) 05/16/2017   HGBA1C 6.5 12/21/2016   HGBA1C 6.7 (H) 07/10/2016   Lab Results  Component Value Date   MICROALBUR <0.7 05/16/2017   LDLCALC 139 (H) 05/16/2017   CREATININE 0.78 05/16/2017     Lab on 05/16/2017  Component Date Value Ref Range Status  . Cholesterol 05/16/2017 216* 0 - 200 mg/dL Final   ATP III Classification       Desirable:  < 200 mg/dL               Borderline High:  200 - 239 mg/dL          High:  > = 240 mg/dL  . Triglycerides 05/16/2017 53.0  0.0 - 149.0 mg/dL Final   Normal:  <150 mg/dLBorderline High:  150 - 199 mg/dL  . HDL 05/16/2017 66.60  >39.00 mg/dL Final  . VLDL 05/16/2017 10.6  0.0 - 40.0 mg/dL Final  . LDL Cholesterol 05/16/2017 139* 0 - 99 mg/dL Final  . Total CHOL/HDL Ratio 05/16/2017 3  Final                  Men          Women1/2 Average Risk     3.4          3.3Average Risk          5.0          4.42X Average Risk          9.6          7.13X Average Risk          15.0          11.0                      . NonHDL 05/16/2017 149.53   Final   NOTE:  Non-HDL goal should be 30 mg/dL higher than patient's LDL goal (i.e. LDL goal of < 70 mg/dL, would have non-HDL goal of < 100 mg/dL)  . Microalb, Ur 05/16/2017 <0.7  0.0 - 1.9 mg/dL Final  . Creatinine,U 05/16/2017 65.9  mg/dL Final  . Microalb Creat Ratio 05/16/2017 1.1  0.0 - 30.0 mg/g Final  . Sodium 05/16/2017 141  135 - 145 mEq/L Final  . Potassium 05/16/2017 4.2  3.5 - 5.1 mEq/L Final  . Chloride 05/16/2017 105  96 - 112 mEq/L Final  . CO2 05/16/2017 28  19 - 32 mEq/L Final  . Glucose, Bld 05/16/2017 114* 70 - 99 mg/dL Final  . BUN 05/16/2017 15  6 - 23 mg/dL Final  . Creatinine, Ser 05/16/2017 0.78  0.40 - 1.20 mg/dL Final  . Total Bilirubin 05/16/2017 0.6  0.2 - 1.2 mg/dL Final  . Alkaline Phosphatase 05/16/2017 69   39 - 117 U/L Final  . AST 05/16/2017 15  0 - 37 U/L Final  . ALT 05/16/2017 12  0 - 35 U/L Final  . Total Protein 05/16/2017 6.9  6.0 - 8.3 g/dL Final  . Albumin 05/16/2017 3.9  3.5 - 5.2 g/dL Final  . Calcium 05/16/2017 9.7  8.4 - 10.5 mg/dL Final  . GFR 05/16/2017 93.39  >60.00 mL/min Final  . Hgb A1c MFr Bld 05/16/2017 6.8* 4.6 - 6.5 % Final   Glycemic Control Guidelines for People with Diabetes:Non Diabetic:  <6%Goal of Therapy: <7%Additional Action Suggested:  >8%     Allergies as of 05/18/2017      Reactions   Meperidine Hcl    REACTION: rash   Statins Other (See Comments)   Muscle pain. Pt able to take Crestor but not everyday.      Medication List        Accurate as of 05/18/17  9:32 AM. Always use your most recent med list.          anastrozole 1 MG tablet Commonly known as:  ARIMIDEX Take 1 tablet (1 mg total) by mouth daily.   aspirin 81 MG tablet Take 81 mg by mouth daily.   cloNIDine 0.1 mg/24hr patch Commonly known as:  CATAPRES - Dosed in mg/24 hr Place 1 patch (0.1 mg total) onto the skin once a week.   INVOKANA 300 MG Tabs tablet Generic drug:  canagliflozin take 1 tablet by mouth daily before the first meal of the day   olopatadine 0.1 % ophthalmic solution Commonly known as:  PATANOL INT 1 GTT LEY BID   ONE TOUCH ULTRA TEST test strip Generic drug:  glucose blood USE TO TEST BLOOD SUGAR ONE TIME DAILY  AS INSTRUCTED   Pitavastatin Calcium 4 MG Tabs Commonly known as:  LIVALO Take one tablet daily   TRULICITY 6.57 QI/6.9GE Sopn Generic drug:  Dulaglutide INJECT 0.75 MG UNDER THE SKIN ONCE A WEEK   Vitamin D3 2000 units Tabs Take by mouth. Takes 3 daily       Allergies:  Allergies  Allergen Reactions  . Meperidine Hcl     REACTION: rash  . Statins Other (See Comments)    Muscle pain. Pt able to take Crestor but not everyday.    Past Medical History:  Diagnosis Date  . Arthritis   . Breast cancer ([place])   . Breast cancer of  upper-outer quadrant of right female breast ([place]) 03/24/2015  . Carpal tunnel syndrome   . Diabetes mellitus without complication ([place])   . GERD (gastroesophageal reflux disease)    Pt takes OTC when needed.  Marland Kitchen Hot flashes     Past Surgical History:  Procedure Laterality Date  . ABDOMINAL HYSTERECTOMY    . APPENDECTOMY    . COLONOSCOPY    . ESOPHAGOGASTRODUODENOSCOPY    . EYE SURGERY Bilateral    Cataracts removed  . JOINT REPLACEMENT    . RADIOACTIVE SEED GUIDED PARTIAL MASTECTOMY WITH AXILLARY SENTINEL LYMPH NODE BIOPSY Right 04/16/2015   Procedure: RADIOACTIVE SEED GUIDED PARTIAL MASTECTOMY WITH AXILLARY SENTINEL LYMPH NODE BIOPSY;  Surgeon: Alphonsa Overall, MD;  Location: [facility];  Service: General;  Laterality: Right;  . SHOULDER SURGERY Right   . TONSILLECTOMY    . TOTAL KNEE ARTHROPLASTY Right 07/18/2013   DR GRAVES  . TOTAL KNEE ARTHROPLASTY Right 07/18/2013   Procedure: TOTAL KNEE ARTHROPLASTY;  Surgeon: Alta Corning, MD;  Location: [facility];  Service: Orthopedics;  Laterality: Right;    History reviewed. No pertinent family history.  Social History:  reports that she has never smoked. She has never used smokeless tobacco. She reports that she drinks alcohol. She reports that she does not use drugs.  Review of Systems:  Lipids:  She was switched from Crestor to Livalo because of her difficulty tolerating this because of joint pains . Now taking Livalo 4 mg but instead of taking it regularly every 2-3 days she takes it 3 or 4 days in a row and then leaves it off until her joint pains subside  Previously baseline LDL has been as high as 200 LDL is relatively higher than last year     Lab Results  Component Value Date   CHOL 216 (H) 05/16/2017   HDL 66.60 05/16/2017   LDLCALC 139 (H) 05/16/2017   LDLDIRECT 120.1 09/30/2013   TRIG 53.0 05/16/2017   CHOLHDL 3 05/16/2017    She takes Catapres patch mostly for hot flashes and not for hypertension  Foot  exam done in 6/18, normal exam  Eye exam 10/18, no abnormalities found and she will have report faxed to [place]    Examination:     BP 124/68 (BP Location: Left Arm, Patient Position: Sitting, Cuff Size: Normal)   Pulse 70   Ht 5\' 2"  (1.575 m)   Wt 172 lb 12.8 oz (78.4 kg)   SpO2 98%   BMI 31.61 kg/m   Body mass index is 31.61 kg/m.      ASSESSMENT/ PLAN:  Diabetes type 2  See history of present illness for detailed discussion of current diabetes management, blood sugar patterns and problems identified  Her blood sugars are well controlled consistently with A1c 6.8 although slightly higher than 0.5  before This is with Invokana and Trulicity Her weight is coming down also  She can try to check more readings after meals rather than just in the morning and she can do this at bedtime  HYPERCHOLESTEROLEMIA:    She has relatively higher LDL level This is probably from her taking only intermittent doses of Livalo Discussed that she should be on a fairly consistent regimen of taking Livalo 3 times a week on Monday/Wednesday and Friday which will allow her to keep it consistent  Will recheck on the next visit    There are no Patient Instructions on file for this visit.   [name] 05/18/2017, 9:32 AM

## 2017-05-18 NOTE — Patient Instructions (Addendum)
More sugars after meals   Take Livalo Monday, Wednesday and Friday

## 2017-05-30 ENCOUNTER — Other Ambulatory Visit: Payer: Self-pay | Admitting: Orthopedic Surgery

## 2017-05-30 ENCOUNTER — Other Ambulatory Visit: Payer: Self-pay | Admitting: *Deleted

## 2017-05-30 MED ORDER — ANASTROZOLE 1 MG PO TABS: 1.0000 mg | ORAL_TABLET | Freq: Every day | ORAL | 2 refills | Status: DC

## 2017-06-01 ENCOUNTER — Inpatient Hospital Stay: Admit: 2017-06-01 | Payer: Medicare Other | Admitting: Orthopedic Surgery

## 2017-06-01 SURGERY — ARTHROPLASTY, KNEE, TOTAL
Anesthesia: Spinal | Site: Knee | Laterality: Left

## 2017-06-04 ENCOUNTER — Other Ambulatory Visit: Payer: Self-pay | Admitting: Orthopedic Surgery

## 2017-06-04 LAB — HM DIABETES EYE EXAM

## 2017-06-06 NOTE — Pre-Procedure Instructions (Signed)
Amanda [name]  06/06/2017      [id number] - [place], [place] - [address] AT [address] [place] [address] Phone: [phone] Fax: [phone]  [id number] - [place], [place] - [address] AT [address] [place] [address] Phone: [phone] Fax: [phone]    Your procedure is scheduled on Fri., Jun 15, 2017  Report to [facility] Admitting Entrance "A" at 10:30AM  Call this number if you have problems the morning of surgery:  [phone]   Remember:  Do not eat food or drink liquids after midnight.  Take these medicines the morning of surgery with A SIP OF WATER: Anastrozole (ARIMIDEX),  CloNIDine Patch, and Olopatadine (PATANOL) eye drops  Follow your doctor's instruction regarding Aspirin.  7 days before surgery (5/3), stop taking all Aspirins, Vitamins, Fish oils, and Herbal medications. Also stop all NSAIDS i.e. Advil, Ibuprofen, Motrin, Aleve, Anaprox, Naproxen, BC and Goody Powders.  How to Manage Your Diabetes Before and After Surgery  Why is it important to control my blood sugar before and after surgery? . Improving blood sugar levels before and after surgery helps healing and can limit problems. . A way of improving blood sugar control is eating a healthy diet by: o  Eating less sugar and carbohydrates o  Increasing activity/exercise o  Talking with your doctor about reaching your blood sugar goals . High blood sugars (greater than 180 mg/dL) can raise your risk of infections and slow your recovery, so you will need to focus on controlling your diabetes during the weeks before surgery. . Make sure that the doctor who takes care of your diabetes knows about your planned surgery including the date and location.  How do I manage my blood sugar before surgery? . Check your blood sugar  at least 4 times a day, starting 2 days before surgery, to make sure that the level is not too high or low. o Check your blood sugar the morning of your surgery when you wake up and every 2 hours until you get to the Short Stay unit. . If your blood sugar is less than 70 mg/dL, you will need to treat for low blood sugar: o Do not take insulin. o Treat a low blood sugar (less than 70 mg/dL) with  cup of clear juice (cranberry or apple), 4 glucose tablets, OR glucose gel. Recheck blood sugar in 15 minutes after treatment (to make sure it is greater than 70 mg/dL). If your blood sugar is not greater than 70 mg/dL on recheck, call [phone] o  for further instructions. . Report your blood sugar to the short stay nurse when you get to Short Stay.  . If you are admitted to the hospital after surgery: o Your blood sugar will be checked by the staff and you will probably be given insulin after surgery (instead of oral diabetes medicines) to make sure you have good blood sugar levels. o The goal for blood sugar control after surgery is 80-180 mg/dL.  WHAT DO I DO ABOUT MY DIABETES MEDICATION?  Marland Kitchen Do not take INVOKANA the morning of surgery.  . The day of surgery, do not take Trulicity (dulaglutide).  . If your CBG is greater than 220 mg/dL, call us at [phone]   Do not wear jewelry, make-up or nail polish.  Do not wear lotions, powders,  perfumes,  or deodorant.  Do not shave 48 hours prior to surgery.    Do not bring valuables to the hospital.  [facility] is not responsible for any belongings or valuables.  Contacts, dentures or bridgework may not be worn into surgery.  Leave your suitcase in the car.  After surgery it may be brought to your room.  For patients admitted to the hospital, discharge time will be determined by your treatment team.  Patients discharged the day of surgery will not be allowed to drive home.   Special instructions:  [facility]- Preparing For  Surgery  Before surgery, you can play an important role. Because skin is not sterile, your skin needs to be as free of germs as possible. You can reduce the number of germs on your skin by washing with CHG (chlorahexidine gluconate) Soap before surgery.  CHG is an antiseptic cleaner which kills germs and bonds with the skin to continue killing germs even after washing.  Please do not use if you have an allergy to CHG or antibacterial soaps. If your skin becomes reddened/irritated stop using the CHG.  Do not shave (including legs and underarms) for at least 48 hours prior to first CHG shower. It is OK to shave your face.  Please follow these instructions carefully.   1. Shower the NIGHT BEFORE SURGERY and the MORNING OF SURGERY with CHG.   2. If you chose to wash your hair, wash your hair first as usual with your normal shampoo.  3. After you shampoo, rinse your hair and body thoroughly to remove the shampoo.  4. Use CHG as you would any other liquid soap. You can apply CHG directly to the skin and wash gently with a scrungie or a clean washcloth.   5. Apply the CHG Soap to your body ONLY FROM THE NECK DOWN.  Do not use on open wounds or open sores. Avoid contact with your eyes, ears, mouth and genitals (private parts). Wash Face and genitals (private parts)  with your normal soap.  6. Wash thoroughly, paying special attention to the area where your surgery will be performed.  7. Thoroughly rinse your body with warm water from the neck down.  8. DO NOT shower/wash with your normal soap after using and rinsing off the CHG Soap.  9. Pat yourself dry with a CLEAN TOWEL.  10. Wear CLEAN PAJAMAS to bed the night before surgery, wear comfortable clothes the morning of surgery  11. Place CLEAN SHEETS on your bed the night of your first shower and DO NOT SLEEP WITH PETS.  Day of Surgery: Do not apply any deodorants/lotions. Please wear clean clothes to the hospital/surgery center.    Please  read over the following fact sheets that you were given. Pain Booklet, Coughing and Deep Breathing, Total Joint Packet, MRSA Information and Surgical Site Infection Prevention

## 2017-06-07 ENCOUNTER — Encounter (HOSPITAL_COMMUNITY): Payer: Self-pay

## 2017-06-07 ENCOUNTER — Ambulatory Visit (HOSPITAL_COMMUNITY)
Admission: RE | Admit: 2017-06-07 | Discharge: 2017-06-07 | Disposition: A | Discharge status: Home / Self Care | Payer: Medicare Other | Source: Ambulatory Visit | Attending: Orthopedic Surgery | Admitting: Orthopedic Surgery

## 2017-06-07 ENCOUNTER — Other Ambulatory Visit: Payer: Self-pay

## 2017-06-07 ENCOUNTER — Encounter (HOSPITAL_COMMUNITY)
Admission: RE | Admit: 2017-06-07 | Discharge: 2017-06-07 | Disposition: A | Discharge status: Home / Self Care | Payer: Medicare Other | Source: Ambulatory Visit | Attending: Orthopedic Surgery | Admitting: Orthopedic Surgery

## 2017-06-07 DIAGNOSIS — Z01812 Encounter for preprocedural laboratory examination: Secondary | ICD-10-CM | POA: Insufficient documentation

## 2017-06-07 DIAGNOSIS — Z01810 Encounter for preprocedural cardiovascular examination: Secondary | ICD-10-CM | POA: Insufficient documentation

## 2017-06-07 DIAGNOSIS — K219 Gastro-esophageal reflux disease without esophagitis: Secondary | ICD-10-CM | POA: Diagnosis not present

## 2017-06-07 DIAGNOSIS — C50919 Malignant neoplasm of unspecified site of unspecified female breast: Secondary | ICD-10-CM | POA: Insufficient documentation

## 2017-06-07 DIAGNOSIS — E119 Type 2 diabetes mellitus without complications: Secondary | ICD-10-CM | POA: Diagnosis not present

## 2017-06-07 DIAGNOSIS — Z01818 Encounter for other preprocedural examination: Secondary | ICD-10-CM | POA: Diagnosis present

## 2017-06-07 LAB — URINALYSIS, ROUTINE W REFLEX MICROSCOPIC
BILIRUBIN URINE: NEGATIVE
HGB URINE DIPSTICK: NEGATIVE
KETONES UR: NEGATIVE mg/dL
LEUKOCYTES UA: NEGATIVE
Nitrite: NEGATIVE
PH: 5 (ref 5.0–8.0)
Protein, ur: NEGATIVE mg/dL
Specific Gravity, Urine: 1.015 (ref 1.005–1.030)

## 2017-06-07 LAB — CBC WITH DIFFERENTIAL/PLATELET
BASOS ABS: 0 10*3/uL (ref 0.0–0.1)
BASOS PCT: 1 %
EOS PCT: 4 %
Eosinophils Absolute: 0.2 10*3/uL (ref 0.0–0.7)
HEMATOCRIT: 39.3 % (ref 36.0–46.0)
Hemoglobin: 12.9 g/dL (ref 12.0–15.0)
Lymphocytes Relative: 53 %
Lymphs Abs: 2.8 10*3/uL (ref 0.7–4.0)
MCH: 31.2 pg (ref 26.0–34.0)
MCHC: 32.8 g/dL (ref 30.0–36.0)
MCV: 95.2 fL (ref 78.0–100.0)
MONO ABS: 0.4 10*3/uL (ref 0.1–1.0)
MONOS PCT: 8 %
NEUTROS ABS: 1.8 10*3/uL (ref 1.7–7.7)
Neutrophils Relative %: 34 %
PLATELETS: 366 10*3/uL (ref 150–400)
RBC: 4.13 MIL/uL (ref 3.87–5.11)
RDW: 15.6 % — AB (ref 11.5–15.5)
WBC: 5.3 10*3/uL (ref 4.0–10.5)

## 2017-06-07 LAB — SURGICAL PCR SCREEN
MRSA, PCR: NEGATIVE
Staphylococcus aureus: POSITIVE — AB

## 2017-06-07 LAB — BASIC METABOLIC PANEL
ANION GAP: 8 (ref 5–15)
BUN: 19 mg/dL (ref 6–20)
CO2: 26 mmol/L (ref 22–32)
Calcium: 9.9 mg/dL (ref 8.9–10.3)
Chloride: 105 mmol/L (ref 101–111)
Creatinine, Ser: 0.91 mg/dL (ref 0.44–1.00)
GFR calc Af Amer: >60 mL/min (ref >60)
GLUCOSE: 99 mg/dL (ref 65–99)
Potassium: 4.5 mmol/L (ref 3.5–5.1)
Sodium: 139 mmol/L (ref 135–145)

## 2017-06-07 LAB — TYPE AND SCREEN
ABO/RH(D): A POS
Antibody Screen: NEGATIVE

## 2017-06-07 LAB — GLUCOSE, CAPILLARY: Glucose-Capillary: 109 mg/dL — ABNORMAL HIGH (ref 65–99)

## 2017-06-07 LAB — APTT: APTT: 29 s (ref 24–36)

## 2017-06-07 LAB — PROTIME-INR
INR: 0.88
Prothrombin Time: 11.8 seconds (ref 11.4–15.2)

## 2017-06-07 NOTE — Progress Notes (Signed)
PCP -  Jenelle Mages Cardiologist - denies  Chest x-ray - 5/2 EKG - 5/2 Stress [place] -  ECHO - denies Cardiac Cath - denies  Sleep Study -  CPAP -   Fasting Blood Sugar - 97-109 Checks Blood Sugar ___2__ times a week   Aspirin Instructions: last dose 5/3  Anesthesia review: NO  Patient denies shortness of breath, fever, cough and chest pain at PAT appointment   Patient verbalized understanding of instructions that were given to them at the PAT appointment. Patient was also instructed that they will need to review over the PAT instructions again at home before surgery.

## 2017-06-11 NOTE — Progress Notes (Signed)
Patient called stating that pharmacy did not have prescription for mupirocin. Called and spoke to pharmacist at [facility] listed on chart. Patient recontacted and notified that prescription was called in.

## 2017-06-14 MED ORDER — TRANEXAMIC ACID 1000 MG/10ML IV SOLN
2000.0000 mg | INTRAVENOUS | Status: DC
  Filled 2017-06-14: qty 20

## 2017-06-14 MED ORDER — CEFAZOLIN SODIUM-DEXTROSE 2-4 GM/100ML-% IV SOLN: 2.0000 g | INTRAVENOUS | Status: DC

## 2017-06-14 MED ORDER — TRANEXAMIC ACID 1000 MG/10ML IV SOLN
2000.0000 mg | INTRAVENOUS | Status: AC
  Administered 2017-06-15: 2000 mg via TOPICAL
  Filled 2017-06-14: qty 20

## 2017-06-14 MED ORDER — LACTATED RINGERS IV SOLN
INTRAVENOUS | Status: DC
  Administered 2017-06-15 (×2): via INTRAVENOUS

## 2017-06-14 NOTE — Progress Notes (Signed)
Patient very upset over the fact that time and location has changed.  Allowed patient to verbalize and informed patient that all time changes are done by surgeon office and that hospital does not do time changes for surgery.

## 2017-06-14 NOTE — H&P (Addendum)
TOTAL KNEE ADMISSION H&P  Patient is being admitted for left total knee arthroplasty.  Subjective:  Chief Complaint:left knee pain.  HPI: Amanda [name], 72 y.o. female, has a history of pain and functional disability in the left knee due to arthritis and has failed non-surgical conservative treatments for greater than 12 weeks to includeNSAID's and/or analgesics, corticosteriod injections, viscosupplementation injections, use of assistive devices and weight reduction as appropriate.  Onset of symptoms was gradual, starting 4 years ago with gradually worsening course since that time. The patient noted no past surgery on the left knee(s).  Patient currently rates pain in the left knee(s) at 8 out of 10 with activity. Patient has night pain, worsening of pain with activity and weight bearing, pain that interferes with activities of daily living, pain with passive range of motion, crepitus and joint swelling.  Patient has evidence of subchondral cysts, subchondral sclerosis and joint space narrowing by imaging studies. This patient has had failure of all reasonable conservative care. There is no active infection.  Patient Active Problem List   Diagnosis Date Noted  . Diabetes mellitus type II, non insulin dependent ([place]) 03/31/2015  . Malignant neoplasm of upper-outer quadrant of right breast in female, estrogen receptor positive ([place]) 03/24/2015  . Acute blood loss anemia 07/20/2013  . Osteoarthritis of right knee 07/18/2013  . Osteoarthritis of left knee 07/18/2013  . Hyperlipidemia 11/26/2012  . Osteoarthritis of both knees 11/26/2012  . COUGH 03/11/2007   Past Medical History:  Diagnosis Date  . Arthritis   . Breast cancer ([place])   . Breast cancer of upper-outer quadrant of right female breast ([place]) 03/24/2015  . Carpal tunnel syndrome   . Diabetes mellitus without complication ([place])    type 2  . GERD (gastroesophageal reflux disease)    Pt takes OTC when needed.  Marland Kitchen Hot flashes      Past Surgical History:  Procedure Laterality Date  . ABDOMINAL HYSTERECTOMY    . APPENDECTOMY    . COLONOSCOPY    . ESOPHAGOGASTRODUODENOSCOPY    . EYE SURGERY Bilateral    Cataracts removed  . JOINT REPLACEMENT    . RADIOACTIVE SEED GUIDED PARTIAL MASTECTOMY WITH AXILLARY SENTINEL LYMPH NODE BIOPSY Right 04/16/2015   Procedure: RADIOACTIVE SEED GUIDED PARTIAL MASTECTOMY WITH AXILLARY SENTINEL LYMPH NODE BIOPSY;  Surgeon: Alphonsa Overall, MD;  Location: [facility];  Service: General;  Laterality: Right;  . SHOULDER SURGERY Right   . TONSILLECTOMY    . TOTAL KNEE ARTHROPLASTY Right 07/18/2013   DR Juliani Laduke  . TOTAL KNEE ARTHROPLASTY Right 07/18/2013   Procedure: TOTAL KNEE ARTHROPLASTY;  Surgeon: Alta Corning, MD;  Location: [facility];  Service: Orthopedics;  Laterality: Right;    Current Facility-Administered Medications  Medication Dose Route Frequency Provider Last Rate Last Dose  . [START ON 06/15/2017] lactated ringers infusion   Intravenous Continuous Dorna Leitz, MD      . Derrill Memo ON 06/15/2017] tranexamic acid (CYKLOKAPRON) 2,000 mg in sodium chloride 0.9 % 50 mL Topical Application  9,371 mg Topical To OR Minda Ditto, RPH       Current Outpatient Medications  Medication Sig Dispense Refill Last Dose  . anastrozole (ARIMIDEX) 1 MG tablet Take 1 tablet (1 mg total) by mouth daily. 30 tablet 2   . aspirin 81 MG tablet Take 81 mg by mouth daily.   Taking  . Cholecalciferol (VITAMIN D3) 2000 units TABS Take 6,000 Units by mouth daily.    Taking  . cloNIDine (CATAPRES -  DOSED IN MG/24 HR) 0.1 mg/24hr patch Place 1 patch (0.1 mg total) onto the skin once a week. 4 patch 12 Taking  . Dulaglutide (TRULICITY) 4.49 QP/5.9FM SOPN INJECT 0.75 MG UNDER THE SKIN ONCE A WEEK (Patient taking differently: Inject 0.75 mg into the skin once a week. ) 4 pen 1   . INVOKANA 300 MG TABS tablet take 1 tablet by mouth daily before the first meal of the day (Patient taking differently: Take 300 mg  by mouth daily before the first meal of the day) 30 tablet 3 Taking  . olopatadine (PATANOL) 0.1 % ophthalmic solution Instill 1 drop in left eye twice daily  11 Taking  . ONE TOUCH ULTRA TEST test strip USE TO TEST BLOOD SUGAR ONE TIME DAILY AS INSTRUCTED 100 each 1 Taking  . Pitavastatin Calcium (LIVALO) 4 MG TABS Take one tablet daily (Patient taking differently: Take 4 mg by mouth daily. ) 30 tablet 3 Taking   Allergies  Allergen Reactions  . Statins Other (See Comments)    Muscle pain. Pt able to take Crestor but not everyday.  . Meperidine Hcl Rash    Social History   Tobacco Use  . Smoking status: Never Smoker  . Smokeless tobacco: Never Used  Substance Use Topics  . Alcohol use: Yes    Comment: socially, occ weekends    No family history on file.   ROS ROS: I have reviewed the patient's review of systems thoroughly and there are no positive responses as relates to the HPI. Objective:  Physical Exam  Vital signs in last 24 hours:    Vitals:   06/15/17 1004 06/15/17 1005  BP:    Pulse: (!) 59 (!) 59  Resp: 13 19  Temp:    SpO2: 100% 100%    Well-developed well-nourished patient in no acute distress. Alert and oriented x3 HEENT:within normal limits Cardiac: Regular rate and rhythm Pulmonary: Lungs clear to auscultation Abdomen: Soft and nontender.  Normal active bowel sounds  Musculoskeletal: (l knee: painful rom limited rom trace effusion Labs: Recent Results (from the past 2160 hour(s))  Lipid panel     Status: Abnormal   Collection Time: 05/16/17  9:23 AM  Result Value Ref Range   Cholesterol 216 (H) 0 - 200 mg/dL    Comment: ATP III Classification       Desirable:  < 200 mg/dL               Borderline High:  200 - 239 mg/dL          High:  > = 240 mg/dL   Triglycerides 53.0 0.0 - 149.0 mg/dL    Comment: Normal:  <150 mg/dLBorderline High:  150 - 199 mg/dL   HDL 66.60 >39.00 mg/dL   VLDL 10.6 0.0 - 40.0 mg/dL   LDL Cholesterol 139 (H) 0 - 99 mg/dL    Total CHOL/HDL Ratio 3     Comment:                Men          Women1/2 Average Risk     3.4          3.3Average Risk          5.0          4.42X Average Risk          9.6          7.13X Average Risk  15.0          11.0                       NonHDL 149.53     Comment: NOTE:  Non-HDL goal should be 30 mg/dL higher than patient's LDL goal (i.e. LDL goal of < 70 mg/dL, would have non-HDL goal of < 100 mg/dL)  Microalbumin / creatinine urine ratio     Status: None   Collection Time: 05/16/17  9:23 AM  Result Value Ref Range   Microalb, Ur <0.7 0.0 - 1.9 mg/dL   Creatinine,U 65.9 mg/dL   Microalb Creat Ratio 1.1 0.0 - 30.0 mg/g  Comprehensive metabolic panel     Status: Abnormal   Collection Time: 05/16/17  9:23 AM  Result Value Ref Range   Sodium 141 135 - 145 mEq/L   Potassium 4.2 3.5 - 5.1 mEq/L   Chloride 105 96 - 112 mEq/L   CO2 28 19 - 32 mEq/L   Glucose, Bld 114 (H) 70 - 99 mg/dL   BUN 15 6 - 23 mg/dL   Creatinine, Ser 0.78 0.40 - 1.20 mg/dL   Total Bilirubin 0.6 0.2 - 1.2 mg/dL   Alkaline Phosphatase 69 39 - 117 U/L   AST 15 0 - 37 U/L   ALT 12 0 - 35 U/L   Total Protein 6.9 6.0 - 8.3 g/dL   Albumin 3.9 3.5 - 5.2 g/dL   Calcium 9.7 8.4 - 10.5 mg/dL   GFR 93.39 >60.00 mL/min  Hemoglobin A1c     Status: Abnormal   Collection Time: 05/16/17  9:23 AM  Result Value Ref Range   Hgb A1c MFr Bld 6.8 (H) 4.6 - 6.5 %    Comment: Glycemic Control Guidelines for People with Diabetes:Non Diabetic:  <6%Goal of Therapy: <7%Additional Action Suggested:  >8%   Glucose, capillary     Status: Abnormal   Collection Time: 06/07/17  8:07 AM  Result Value Ref Range   Glucose-Capillary 109 (H) 65 - 99 mg/dL  Type and screen Order type and screen if day of surgery is less than 15 days from draw of preadmission visit or order morning of surgery if day of surgery is greater than 6 days from preadmission visit.     Status: None   Collection Time: 06/07/17  8:38 AM  Result Value Ref Range    ABO/RH(D) A POS    Antibody Screen NEG    Sample Expiration 06/21/2017    Extend sample reason      NO TRANSFUSIONS OR PREGNANCY IN THE PAST 3 MONTHS Performed at [facility], [place] [address], [place], [place] [address]   Urinalysis, Routine w reflex microscopic     Status: Abnormal   Collection Time: 06/07/17  8:38 AM  Result Value Ref Range   Color, Urine STRAW (A) YELLOW   APPearance CLEAR CLEAR   Specific Gravity, Urine 1.015 1.005 - 1.030   pH 5.0 5.0 - 8.0   Glucose, UA >=500 (A) NEGATIVE mg/dL   Hgb urine dipstick NEGATIVE NEGATIVE   Bilirubin Urine NEGATIVE NEGATIVE   Ketones, ur NEGATIVE NEGATIVE mg/dL   Protein, ur NEGATIVE NEGATIVE mg/dL   Nitrite NEGATIVE NEGATIVE   Leukocytes, UA NEGATIVE NEGATIVE   RBC / HPF 0-5 0 - 5 RBC/hpf   WBC, UA 0-5 0 - 5 WBC/hpf   Bacteria, UA RARE (A) NONE SEEN   Squamous Epithelial / LPF 0-5 0 - 5  Comment: Please note change in reference range.   Mucus PRESENT     Comment: Performed at [facility], [place] [address] [place], [place] [address]  Surgical pcr screen     Status: Abnormal   Collection Time: 06/07/17  8:38 AM  Result Value Ref Range   MRSA, PCR NEGATIVE NEGATIVE   Staphylococcus aureus POSITIVE (A) NEGATIVE    Comment: (NOTE) The Xpert SA Assay (FDA approved for NASAL specimens in patients 84 years of age and older), is one component of a comprehensive surveillance program. It is not intended to diagnose infection nor to guide or monitor treatment. Performed at [facility], [place] [address], [place], [place] [address]   APTT     Status: None   Collection Time: 06/07/17  8:39 AM  Result Value Ref Range   aPTT 29 24 - 36 seconds    Comment: Performed at [facility] [address] [place], [place] [address]  Basic metabolic panel     Status: None   Collection Time: 06/07/17  8:39 AM  Result Value Ref Range   Sodium 139 135 - 145 mmol/L   Potassium 4.5 3.5 - 5.1 mmol/L   Chloride 105 101 -  111 mmol/L   CO2 26 22 - 32 mmol/L   Glucose, Bld 99 65 - 99 mg/dL   BUN 19 6 - 20 mg/dL   Creatinine, Ser 0.91 0.44 - 1.00 mg/dL   Calcium 9.9 8.9 - 10.3 mg/dL   GFR calc non Af Amer >60 >60 mL/min   GFR calc Af Amer >60 >60 mL/min    Comment: (NOTE) The eGFR has been calculated using the CKD EPI equation. This calculation has not been validated in all clinical situations. eGFR's persistently <60 mL/min signify possible Chronic Kidney Disease.    Anion gap 8 5 - 15    Comment: Performed at [facility] [address] [place], [place] [address]  CBC WITH DIFFERENTIAL     Status: Abnormal   Collection Time: 06/07/17  8:39 AM  Result Value Ref Range   WBC 5.3 4.0 - 10.5 K/uL   RBC 4.13 3.87 - 5.11 MIL/uL   Hemoglobin 12.9 12.0 - 15.0 g/dL   HCT 39.3 36.0 - 46.0 %   MCV 95.2 78.0 - 100.0 fL   MCH 31.2 26.0 - 34.0 pg   MCHC 32.8 30.0 - 36.0 g/dL   RDW 15.6 (H) 11.5 - 15.5 %   Platelets 366 150 - 400 K/uL   Neutrophils Relative % 34 %   Neutro Abs 1.8 1.7 - 7.7 K/uL   Lymphocytes Relative 53 %   Lymphs Abs 2.8 0.7 - 4.0 K/uL   Monocytes Relative 8 %   Monocytes Absolute 0.4 0.1 - 1.0 K/uL   Eosinophils Relative 4 %   Eosinophils Absolute 0.2 0.0 - 0.7 K/uL   Basophils Relative 1 %   Basophils Absolute 0.0 0.0 - 0.1 K/uL    Comment: Performed at [facility], [place] [address] [place], [place] [address]  Protime-INR     Status: None   Collection Time: 06/07/17  8:39 AM  Result Value Ref Range   Prothrombin Time 11.8 11.4 - 15.2 seconds   INR 0.88     Comment: Performed at [facility] [address], [place], [place] [address]    Estimated body mass index is 31.32 kg/m as calculated from the following:   Height as of 06/07/17: 5'  2.5" (1.588 m).   Weight as of 06/07/17: 78.9 kg (174 lb).   Imaging Review Plain radiographs demonstrate severe degenerative joint disease of the left knee(s). The overall alignment ismild varus. The bone quality appears to be  fair for age and reported activity level.   Preoperative templating of the joint replacement has been completed, documented, and submitted to the Operating Room personnel in order to optimize intra-operative equipment management.   Anticipated LOS equal to or greater than 2 midnights due to - Age 70 and older with one or more of the following:  - Obesity  - Expected need for hospital services (PT, OT, Nursing) required for safe  discharge  - Anticipated need for postoperative skilled nursing care or inpatient rehab  - Active co-morbidities: Anemia OR   - Unanticipated findings during/Post Surgery: None  - Patient is a high risk of re-admission due to: Barriers to post-acute care (logistical, no family support in home)     Assessment/Plan:  End stage arthritis, left knee   The patient history, physical examination, clinical judgment of the provider and imaging studies are consistent with end stage degenerative joint disease of the left knee(s) and total knee arthroplasty is deemed medically necessary. The treatment options including medical management, injection therapy arthroscopy and arthroplasty were discussed at length. The risks and benefits of total knee arthroplasty were presented and reviewed. The risks due to aseptic loosening, infection, stiffness, patella tracking problems, thromboembolic complications and other imponderables were discussed. The patient acknowledged the explanation, agreed to proceed with the plan and consent was signed. Patient is being admitted for inpatient treatment for surgery, pain control, PT, OT, prophylactic antibiotics, VTE prophylaxis, progressive ambulation and ADL's and discharge planning. The patient is planning to be discharged home with home health services  No change in H'@P'  overnight.

## 2017-06-15 ENCOUNTER — Inpatient Hospital Stay (HOSPITAL_COMMUNITY): Payer: Medicare Other | Admitting: Registered Nurse

## 2017-06-15 ENCOUNTER — Other Ambulatory Visit: Payer: Self-pay

## 2017-06-15 ENCOUNTER — Encounter (HOSPITAL_COMMUNITY): Payer: Self-pay | Admitting: *Deleted

## 2017-06-15 ENCOUNTER — Inpatient Hospital Stay (HOSPITAL_COMMUNITY)
Admission: RE | Admit: 2017-06-15 | Discharge: 2017-06-17 | DRG: 470 | Disposition: A | Discharge status: Home Health | Payer: Medicare Other | Source: Ambulatory Visit | Attending: Orthopedic Surgery | Admitting: Orthopedic Surgery

## 2017-06-15 ENCOUNTER — Encounter (HOSPITAL_COMMUNITY)
Admission: RE | Disposition: A | Discharge status: Home Health | Payer: Self-pay | Source: Ambulatory Visit | Attending: Orthopedic Surgery

## 2017-06-15 DIAGNOSIS — M1712 Unilateral primary osteoarthritis, left knee: Secondary | ICD-10-CM | POA: Diagnosis present

## 2017-06-15 DIAGNOSIS — Z79891 Long term (current) use of opiate analgesic: Secondary | ICD-10-CM

## 2017-06-15 DIAGNOSIS — Z90710 Acquired absence of both cervix and uterus: Secondary | ICD-10-CM | POA: Diagnosis not present

## 2017-06-15 DIAGNOSIS — Z170 Estrogen receptor positive status [ER+]: Secondary | ICD-10-CM

## 2017-06-15 DIAGNOSIS — E669 Obesity, unspecified: Secondary | ICD-10-CM | POA: Diagnosis present

## 2017-06-15 DIAGNOSIS — E785 Hyperlipidemia, unspecified: Secondary | ICD-10-CM | POA: Diagnosis present

## 2017-06-15 DIAGNOSIS — Z79811 Long term (current) use of aromatase inhibitors: Secondary | ICD-10-CM | POA: Diagnosis not present

## 2017-06-15 DIAGNOSIS — K219 Gastro-esophageal reflux disease without esophagitis: Secondary | ICD-10-CM | POA: Diagnosis present

## 2017-06-15 DIAGNOSIS — Z6831 Body mass index (BMI) 31.0-31.9, adult: Secondary | ICD-10-CM

## 2017-06-15 DIAGNOSIS — Z9842 Cataract extraction status, left eye: Secondary | ICD-10-CM

## 2017-06-15 DIAGNOSIS — Z9049 Acquired absence of other specified parts of digestive tract: Secondary | ICD-10-CM | POA: Diagnosis not present

## 2017-06-15 DIAGNOSIS — Z9011 Acquired absence of right breast and nipple: Secondary | ICD-10-CM | POA: Diagnosis not present

## 2017-06-15 DIAGNOSIS — E119 Type 2 diabetes mellitus without complications: Secondary | ICD-10-CM | POA: Diagnosis present

## 2017-06-15 DIAGNOSIS — Z9841 Cataract extraction status, right eye: Secondary | ICD-10-CM

## 2017-06-15 DIAGNOSIS — Z7982 Long term (current) use of aspirin: Secondary | ICD-10-CM | POA: Diagnosis not present

## 2017-06-15 DIAGNOSIS — C50411 Malignant neoplasm of upper-outer quadrant of right female breast: Secondary | ICD-10-CM | POA: Diagnosis present

## 2017-06-15 DIAGNOSIS — Z79899 Other long term (current) drug therapy: Secondary | ICD-10-CM

## 2017-06-15 DIAGNOSIS — Z96651 Presence of right artificial knee joint: Secondary | ICD-10-CM | POA: Diagnosis present

## 2017-06-15 HISTORY — PX: TOTAL KNEE ARTHROPLASTY: SHX125

## 2017-06-15 LAB — GLUCOSE, CAPILLARY
GLUCOSE-CAPILLARY: 145 mg/dL — AB (ref 65–99)
Glucose-Capillary: 94 mg/dL (ref 65–99)
Glucose-Capillary: 84 mg/dL (ref 65–99)
Glucose-Capillary: 156 mg/dL — ABNORMAL HIGH (ref 65–99)

## 2017-06-15 LAB — TYPE AND SCREEN
ABO/RH(D): A POS
ANTIBODY SCREEN: NEGATIVE

## 2017-06-15 LAB — ABO/RH: ABO/RH(D): A POS

## 2017-06-15 SURGERY — ARTHROPLASTY, KNEE, TOTAL
Anesthesia: Spinal | Site: Knee | Laterality: Left

## 2017-06-15 MED ORDER — EPHEDRINE 5 MG/ML INJ
INTRAVENOUS | Status: AC
  Filled 2017-06-15: qty 10

## 2017-06-15 MED ORDER — PHENYLEPHRINE 40 MCG/ML (10ML) SYRINGE FOR IV PUSH (FOR BLOOD PRESSURE SUPPORT)
PREFILLED_SYRINGE | INTRAVENOUS | Status: DC | PRN
  Administered 2017-06-15 (×3): 80 ug via INTRAVENOUS

## 2017-06-15 MED ORDER — ACETAMINOPHEN 325 MG PO TABS
325.0000 mg | ORAL_TABLET | Freq: Four times a day (QID) | ORAL | Status: DC | PRN
  Administered 2017-06-16 – 2017-06-17 (×2): 650 mg via ORAL
  Filled 2017-06-15: qty 2

## 2017-06-15 MED ORDER — PROPOFOL 500 MG/50ML IV EMUL
INTRAVENOUS | Status: DC | PRN
  Administered 2017-06-15: 75 ug/kg/min via INTRAVENOUS

## 2017-06-15 MED ORDER — PHENYLEPHRINE 40 MCG/ML (10ML) SYRINGE FOR IV PUSH (FOR BLOOD PRESSURE SUPPORT)
PREFILLED_SYRINGE | INTRAVENOUS | Status: AC
  Filled 2017-06-15: qty 10

## 2017-06-15 MED ORDER — LIDOCAINE 2% (20 MG/ML) 5 ML SYRINGE
INTRAMUSCULAR | Status: AC
  Filled 2017-06-15: qty 5

## 2017-06-15 MED ORDER — SODIUM CHLORIDE 0.9 % IV SOLN
INTRAVENOUS | Status: DC
  Administered 2017-06-15: 16:00:00 via INTRAVENOUS

## 2017-06-15 MED ORDER — MEPERIDINE HCL 50 MG/ML IJ SOLN: 6.2500 mg | INTRAMUSCULAR | Status: DC | PRN

## 2017-06-15 MED ORDER — CELECOXIB 200 MG PO CAPS
200.0000 mg | ORAL_CAPSULE | Freq: Two times a day (BID) | ORAL | Status: DC
  Administered 2017-06-15 – 2017-06-17 (×4): 200 mg via ORAL
  Filled 2017-06-15 (×5): qty 1

## 2017-06-15 MED ORDER — ONDANSETRON HCL 4 MG/2ML IJ SOLN: 4.0000 mg | Freq: Four times a day (QID) | INTRAMUSCULAR | Status: DC | PRN

## 2017-06-15 MED ORDER — HYDROMORPHONE HCL 1 MG/ML IJ SOLN
0.5000 mg | INTRAMUSCULAR | Status: DC | PRN
  Administered 2017-06-15 – 2017-06-16 (×3): 1 mg via INTRAVENOUS
  Filled 2017-06-15 (×3): qty 1

## 2017-06-15 MED ORDER — LIDOCAINE 2% (20 MG/ML) 5 ML SYRINGE
INTRAMUSCULAR | Status: DC | PRN
  Administered 2017-06-15: 40 mg via INTRAVENOUS

## 2017-06-15 MED ORDER — PROPOFOL 10 MG/ML IV BOLUS
INTRAVENOUS | Status: AC
  Filled 2017-06-15: qty 20

## 2017-06-15 MED ORDER — DEXAMETHASONE SODIUM PHOSPHATE 10 MG/ML IJ SOLN
INTRAMUSCULAR | Status: AC
  Filled 2017-06-15: qty 1

## 2017-06-15 MED ORDER — ONDANSETRON HCL 4 MG PO TABS: 4.0000 mg | ORAL_TABLET | Freq: Four times a day (QID) | ORAL | Status: DC | PRN

## 2017-06-15 MED ORDER — CANAGLIFLOZIN 100 MG PO TABS
300.0000 mg | ORAL_TABLET | Freq: Every day | ORAL | Status: DC
  Administered 2017-06-16 – 2017-06-17 (×2): 300 mg via ORAL
  Filled 2017-06-15 (×2): qty 3

## 2017-06-15 MED ORDER — ANASTROZOLE 1 MG PO TABS
1.0000 mg | ORAL_TABLET | Freq: Every day | ORAL | Status: DC
  Administered 2017-06-16 – 2017-06-17 (×2): 1 mg via ORAL
  Filled 2017-06-15 (×2): qty 1

## 2017-06-15 MED ORDER — BUPIVACAINE LIPOSOME 1.3 % IJ SUSP
INTRAMUSCULAR | Status: DC | PRN
  Administered 2017-06-15: 20 mL

## 2017-06-15 MED ORDER — TRANEXAMIC ACID 1000 MG/10ML IV SOLN
1000.0000 mg | Freq: Once | INTRAVENOUS | Status: AC
  Administered 2017-06-15: 1000 mg via INTRAVENOUS
  Filled 2017-06-15: qty 10

## 2017-06-15 MED ORDER — CEFAZOLIN SODIUM-DEXTROSE 2-4 GM/100ML-% IV SOLN
2.0000 g | INTRAVENOUS | Status: AC
  Administered 2017-06-15: 2 g via INTRAVENOUS
  Filled 2017-06-15: qty 100

## 2017-06-15 MED ORDER — ASPIRIN EC 325 MG PO TBEC: 325.0000 mg | DELAYED_RELEASE_TABLET | Freq: Two times a day (BID) | ORAL | 0 refills | Status: DC

## 2017-06-15 MED ORDER — HYDROMORPHONE HCL 1 MG/ML IJ SOLN: 0.2500 mg | INTRAMUSCULAR | Status: DC | PRN

## 2017-06-15 MED ORDER — PROPOFOL 10 MG/ML IV BOLUS
INTRAVENOUS | Status: AC
  Filled 2017-06-15: qty 60

## 2017-06-15 MED ORDER — BUPIVACAINE IN DEXTROSE 0.75-8.25 % IT SOLN
INTRATHECAL | Status: DC | PRN
  Administered 2017-06-15: 2 mL via INTRATHECAL

## 2017-06-15 MED ORDER — OXYCODONE-ACETAMINOPHEN 5-325 MG PO TABS: 1.0000 | ORAL_TABLET | Freq: Four times a day (QID) | ORAL | 0 refills | Status: DC | PRN

## 2017-06-15 MED ORDER — CHLORHEXIDINE GLUCONATE 4 % EX LIQD: 60.0000 mL | Freq: Once | CUTANEOUS | Status: DC

## 2017-06-15 MED ORDER — MIDAZOLAM HCL 2 MG/2ML IJ SOLN
1.0000 mg | INTRAMUSCULAR | Status: DC
  Administered 2017-06-15: 2 mg via INTRAVENOUS
  Filled 2017-06-15: qty 2

## 2017-06-15 MED ORDER — 0.9 % SODIUM CHLORIDE (POUR BTL) OPTIME
TOPICAL | Status: DC | PRN
  Administered 2017-06-15: 1000 mL

## 2017-06-15 MED ORDER — TRANEXAMIC ACID 1000 MG/10ML IV SOLN
1000.0000 mg | INTRAVENOUS | Status: AC
  Administered 2017-06-15: 1000 mg via INTRAVENOUS
  Filled 2017-06-15: qty 1100

## 2017-06-15 MED ORDER — ALUM & MAG HYDROXIDE-SIMETH 200-200-20 MG/5ML PO SUSP: 30.0000 mL | ORAL | Status: DC | PRN

## 2017-06-15 MED ORDER — FENTANYL CITRATE (PF) 100 MCG/2ML IJ SOLN
50.0000 ug | INTRAMUSCULAR | Status: DC
  Administered 2017-06-15: 50 ug via INTRAVENOUS
  Filled 2017-06-15: qty 2

## 2017-06-15 MED ORDER — BUPIVACAINE-EPINEPHRINE (PF) 0.5% -1:200000 IJ SOLN
INTRAMUSCULAR | Status: AC
  Filled 2017-06-15: qty 30

## 2017-06-15 MED ORDER — ASPIRIN EC 325 MG PO TBEC
325.0000 mg | DELAYED_RELEASE_TABLET | Freq: Two times a day (BID) | ORAL | Status: DC
  Administered 2017-06-15 – 2017-06-17 (×4): 325 mg via ORAL
  Filled 2017-06-15 (×4): qty 1

## 2017-06-15 MED ORDER — SODIUM CHLORIDE 0.9 % IR SOLN
Status: DC | PRN
  Administered 2017-06-15: 1000 mL

## 2017-06-15 MED ORDER — METHOCARBAMOL 1000 MG/10ML IJ SOLN
500.0000 mg | Freq: Four times a day (QID) | INTRAVENOUS | Status: DC | PRN
  Administered 2017-06-15: 500 mg via INTRAVENOUS
  Filled 2017-06-15: qty 550

## 2017-06-15 MED ORDER — ROPIVACAINE HCL 7.5 MG/ML IJ SOLN
INTRAMUSCULAR | Status: DC | PRN
  Administered 2017-06-15: 20 mL via PERINEURAL

## 2017-06-15 MED ORDER — GABAPENTIN 300 MG PO CAPS
300.0000 mg | ORAL_CAPSULE | Freq: Two times a day (BID) | ORAL | Status: DC
  Administered 2017-06-15 – 2017-06-17 (×4): 300 mg via ORAL
  Filled 2017-06-15 (×5): qty 1

## 2017-06-15 MED ORDER — ONDANSETRON HCL 4 MG/2ML IJ SOLN: 4.0000 mg | Freq: Once | INTRAMUSCULAR | Status: DC | PRN

## 2017-06-15 MED ORDER — POLYETHYLENE GLYCOL 3350 17 G PO PACK: 17.0000 g | PACK | Freq: Every day | ORAL | Status: DC | PRN

## 2017-06-15 MED ORDER — TIZANIDINE HCL 2 MG PO TABS: 2.0000 mg | ORAL_TABLET | Freq: Three times a day (TID) | ORAL | 0 refills | Status: DC | PRN

## 2017-06-15 MED ORDER — SODIUM CHLORIDE 0.9 % IJ SOLN
INTRAMUSCULAR | Status: AC
  Filled 2017-06-15: qty 50

## 2017-06-15 MED ORDER — OLOPATADINE HCL 0.1 % OP SOLN
1.0000 [drp] | Freq: Two times a day (BID) | OPHTHALMIC | Status: DC
  Administered 2017-06-15 – 2017-06-17 (×4): 1 [drp] via OPHTHALMIC
  Filled 2017-06-15: qty 5

## 2017-06-15 MED ORDER — OXYCODONE HCL 5 MG PO TABS
5.0000 mg | ORAL_TABLET | ORAL | Status: DC | PRN
  Administered 2017-06-15 – 2017-06-17 (×8): 10 mg via ORAL
  Filled 2017-06-15 (×8): qty 2

## 2017-06-15 MED ORDER — DEXAMETHASONE SODIUM PHOSPHATE 10 MG/ML IJ SOLN
10.0000 mg | Freq: Two times a day (BID) | INTRAMUSCULAR | Status: AC
  Administered 2017-06-15 – 2017-06-16 (×3): 10 mg via INTRAVENOUS
  Filled 2017-06-15 (×3): qty 1

## 2017-06-15 MED ORDER — DEXAMETHASONE SODIUM PHOSPHATE 10 MG/ML IJ SOLN
INTRAMUSCULAR | Status: DC | PRN
  Administered 2017-06-15: 10 mg via INTRAVENOUS

## 2017-06-15 MED ORDER — BISACODYL 5 MG PO TBEC: 5.0000 mg | DELAYED_RELEASE_TABLET | Freq: Every day | ORAL | Status: DC | PRN

## 2017-06-15 MED ORDER — METHOCARBAMOL 500 MG PO TABS
500.0000 mg | ORAL_TABLET | Freq: Four times a day (QID) | ORAL | Status: DC | PRN
  Administered 2017-06-15 – 2017-06-17 (×4): 500 mg via ORAL
  Filled 2017-06-15 (×4): qty 1

## 2017-06-15 MED ORDER — DOCUSATE SODIUM 100 MG PO CAPS
100.0000 mg | ORAL_CAPSULE | Freq: Two times a day (BID) | ORAL | Status: DC
  Administered 2017-06-15 – 2017-06-17 (×5): 100 mg via ORAL
  Filled 2017-06-15 (×5): qty 1

## 2017-06-15 MED ORDER — CEFAZOLIN SODIUM-DEXTROSE 2-4 GM/100ML-% IV SOLN
2.0000 g | Freq: Four times a day (QID) | INTRAVENOUS | Status: AC
  Administered 2017-06-15 (×2): 2 g via INTRAVENOUS
  Filled 2017-06-15 (×2): qty 100

## 2017-06-15 MED ORDER — ONDANSETRON HCL 4 MG/2ML IJ SOLN
INTRAMUSCULAR | Status: DC | PRN
  Administered 2017-06-15: 4 mg via INTRAVENOUS

## 2017-06-15 MED ORDER — BUPIVACAINE-EPINEPHRINE 0.5% -1:200000 IJ SOLN
INTRAMUSCULAR | Status: DC | PRN
  Administered 2017-06-15: 30 mL

## 2017-06-15 MED ORDER — DIPHENHYDRAMINE HCL 12.5 MG/5ML PO ELIX: 12.5000 mg | ORAL_SOLUTION | ORAL | Status: DC | PRN

## 2017-06-15 MED ORDER — SODIUM CHLORIDE 0.9 % IJ SOLN
INTRAMUSCULAR | Status: DC | PRN
  Administered 2017-06-15: 30 mL

## 2017-06-15 MED ORDER — BUPIVACAINE LIPOSOME 1.3 % IJ SUSP
20.0000 mL | Freq: Once | INTRAMUSCULAR | Status: DC
  Filled 2017-06-15: qty 20

## 2017-06-15 MED ORDER — MAGNESIUM CITRATE PO SOLN: 1.0000 | Freq: Once | ORAL | Status: DC | PRN

## 2017-06-15 MED ORDER — DOCUSATE SODIUM 100 MG PO CAPS: 100.0000 mg | ORAL_CAPSULE | Freq: Two times a day (BID) | ORAL | 0 refills | Status: DC

## 2017-06-15 MED ORDER — INSULIN ASPART 100 UNIT/ML ~~LOC~~ SOLN
0.0000 [IU] | Freq: Three times a day (TID) | SUBCUTANEOUS | Status: DC
  Administered 2017-06-15: 3 [IU] via SUBCUTANEOUS
  Administered 2017-06-16 (×2): 2 [IU] via SUBCUTANEOUS

## 2017-06-15 MED ORDER — EPHEDRINE SULFATE-NACL 50-0.9 MG/10ML-% IV SOSY
PREFILLED_SYRINGE | INTRAVENOUS | Status: DC | PRN
  Administered 2017-06-15: 10 mg via INTRAVENOUS

## 2017-06-15 MED ORDER — PROPOFOL 10 MG/ML IV BOLUS
INTRAVENOUS | Status: DC | PRN
  Administered 2017-06-15 (×5): 20 mg via INTRAVENOUS

## 2017-06-15 MED ORDER — ONDANSETRON HCL 4 MG/2ML IJ SOLN
INTRAMUSCULAR | Status: AC
  Filled 2017-06-15: qty 2

## 2017-06-15 SURGICAL SUPPLY — 50 items
APL SKNCLS STERI-STRIP NONHPOA (GAUZE/BANDAGES/DRESSINGS) ×1
BAG SPEC THK2 15X12 ZIP CLS (MISCELLANEOUS) ×1
BAG ZIPLOCK 12X15 (MISCELLANEOUS) ×3 IMPLANT
BANDAGE ACE 6X5 VEL STRL LF (GAUZE/BANDAGES/DRESSINGS) ×3 IMPLANT
BENZOIN TINCTURE PRP APPL 2/3 (GAUZE/BANDAGES/DRESSINGS) ×3 IMPLANT
BLADE SAGITTAL 25.0X1.19X90 (BLADE) ×2 IMPLANT
BLADE SAGITTAL 25.0X1.19X90MM (BLADE) ×1
BLADE SAW SGTL 11.0X1.19X90.0M (BLADE) ×3 IMPLANT
BOOTIES KNEE HIGH SLOAN (MISCELLANEOUS) ×3 IMPLANT
BOWL SMART MIX CTS (DISPOSABLE) ×3 IMPLANT
CAPT KNEE TOTAL 3 ATTUNE ×2 IMPLANT
CEMENT HV SMART SET (Cement) ×6 IMPLANT
CLOSURE WOUND 1/2 X4 (GAUZE/BANDAGES/DRESSINGS) ×1
CUFF TOURN SGL QUICK 34 (TOURNIQUET CUFF) ×3
CUFF TRNQT CYL 34X4X40X1 (TOURNIQUET CUFF) ×1 IMPLANT
DRAPE TOP 10253 STERILE (DRAPES) IMPLANT
DRAPE U-SHAPE 47X51 STRL (DRAPES) ×3 IMPLANT
DRSG AQUACEL AG ADV 3.5X10 (GAUZE/BANDAGES/DRESSINGS) ×3 IMPLANT
DURAPREP 26ML APPLICATOR (WOUND CARE) ×3 IMPLANT
ELECT REM PT RETURN 15FT ADLT (MISCELLANEOUS) ×3 IMPLANT
GLOVE BIOGEL PI IND STRL 8 (GLOVE) ×2 IMPLANT
GLOVE BIOGEL PI INDICATOR 8 (GLOVE) ×4
GLOVE ECLIPSE 7.5 STRL STRAW (GLOVE) ×6 IMPLANT
GOWN STRL REUS W/TWL XL LVL3 (GOWN DISPOSABLE) ×6 IMPLANT
HANDPIECE INTERPULSE COAX TIP (DISPOSABLE) ×3
HOLDER FOLEY CATH W/STRAP (MISCELLANEOUS) ×2 IMPLANT
HOOD PEEL AWAY FLYTE STAYCOOL (MISCELLANEOUS) ×9 IMPLANT
IMMOBILIZER KNEE 20 (SOFTGOODS) ×3
IMMOBILIZER KNEE 20 THIGH 36 (SOFTGOODS) ×1 IMPLANT
MANIFOLD NEPTUNE II (INSTRUMENTS) ×3 IMPLANT
NEEDLE HYPO 22GX1.5 SAFETY (NEEDLE) ×3 IMPLANT
NS IRRIG 1000ML POUR BTL (IV SOLUTION) ×1 IMPLANT
PACK ICE MAXI GEL EZY WRAP (MISCELLANEOUS) ×1 IMPLANT
PACK TOTAL KNEE CUSTOM (KITS) ×3 IMPLANT
PADDING CAST COTTON 6X4 STRL (CAST SUPPLIES) ×3 IMPLANT
POSITIONER SURGICAL ARM (MISCELLANEOUS) ×3 IMPLANT
SET HNDPC FAN SPRY TIP SCT (DISPOSABLE) ×1 IMPLANT
STAPLER VISISTAT 35W (STAPLE) IMPLANT
STRIP CLOSURE SKIN 1/2X4 (GAUZE/BANDAGES/DRESSINGS) ×1 IMPLANT
SUT MNCRL AB 3-0 PS2 18 (SUTURE) ×3 IMPLANT
SUT VIC AB 0 CT1 36 (SUTURE) ×3 IMPLANT
SUT VIC AB 1 CT1 36 (SUTURE) ×6 IMPLANT
SUT VIC AB 2-0 CT1 27 (SUTURE) ×6
SUT VIC AB 2-0 CT1 TAPERPNT 27 (SUTURE) ×2 IMPLANT
SYR CONTROL 10ML LL (SYRINGE) ×6 IMPLANT
TOWEL OR NON WOVEN STRL DISP B (DISPOSABLE) ×3 IMPLANT
TRAY FOLEY MTR SLVR 16FR STAT (SET/KITS/TRAYS/PACK) ×3 IMPLANT
WATER STERILE IRR 1000ML POUR (IV SOLUTION) ×6 IMPLANT
WRAP KNEE MAXI GEL POST OP (GAUZE/BANDAGES/DRESSINGS) ×2 IMPLANT
YANKAUER SUCT BULB TIP 10FT TU (MISCELLANEOUS) ×3 IMPLANT

## 2017-06-15 NOTE — Transfer of Care (Signed)
Immediate Anesthesia Transfer of Care Note  Patient: Amanda [name]  Procedure(s) Performed: LEFT TOTAL KNEE ARTHROPLASTY (Left Knee)  Patient Location: PACU  Anesthesia Type:Spinal  Level of Consciousness: awake, alert  and oriented  Airway & Oxygen Therapy: Patient Spontanous Breathing and Patient connected to face mask oxygen  Post-op Assessment: Report given to RN and Post -op Vital signs reviewed and stable  Post vital signs: Reviewed and stable  Last Vitals:  Vitals Value Taken Time  BP    Temp    Pulse 59 06/15/2017 12:58 PM  Resp 16 06/15/2017 12:58 PM  SpO2 100 % 06/15/2017 12:58 PM  Vitals shown include unvalidated device data.  Last Pain:  Vitals:   06/15/17 0953  TempSrc:   PainSc: 6       Patients Stated Pain Goal: 3 (03/88/82 8003)  Complications: No apparent anesthesia complications

## 2017-06-15 NOTE — Op Note (Signed)
NAME: Amanda [name], Amanda [name] MEDICAL RECORD ST:[id number] ACCOUNT [id number] DATE OF BIRTH:20-Dec-1945 FACILITY: [facility] LOCATION: [facility] PHYSICIAN:Remmington Teters L. Khaleesi Gruel, MD  OPERATIVE REPORT  DATE OF PROCEDURE:  06/15/2017  PREOPERATIVE DIAGNOSIS:   End-stage degenerative joint disease, left knee, with bone-on-bone change in the medial compartment predominantly.  POSTOPERATIVE DIAGNOSES:  End-stage degenerative joint disease, left knee, with bone-on-bone change in the medial compartment predominantly.   PROCEDURE  PERFORMED:  Left total knee replacement with an Attune system, size 5 femur, size 5 tibia, 5 mm bridging bearing, and a 38 mm all polyethylene patella.  SURGEON:  Dorna Leitz, MD  ASSISTANT:  Gary Fleet, PA-C   ANESTHESIA:  Spinal.  BRIEF HISTORY:  [name] is a 72 year old female with a long history of significant complaints of left knee pain.  She has been treated conservatively for a prolonged period of time.  She had a right total knee replacement that has done great for her,  and after failure of conservative care including activity modification, injection therapy and physical therapy, she was brought to the operating room for left total knee replacement.  The patient was having night pain, light activity pain, and x-ray  showed bone-on-bone change.  DESCRIPTION OF PROCEDURE:  The patient was brought to the operating room and after adequate anesthesia was obtained with a spinal anesthetic, the patient was placed supine on the operating table.  Left leg was prepped and draped in usual sterile fashion.   Following this, the leg was exsanguinated and blood pressure tourniquet inflated to 300 mmHg.  Following this, a midline incision was made.  The subcutaneous tissue dissected down to the level of the extensor mechanism and a medial parapatellar  arthrotomy was undertaken.  Following this, the medial and lateral meniscus were removed, retropatellar fat pad, synovium on the anterior  aspect of the femur, and the anterior and posterior cruciates.  At this point, an intramedullary pilot hole was  drilled in the femur and the femur was cut with a 4 degree valgus inclination with 9 mm of distal bone.  The femur sized to a 5.  Anterior and posterior cuts were made.  Chamfers and box.  Attention was then turned to the tibia, where it was exposed and  it is cut perpendicular to its long axis with a 3 degree posterior slope.  The tibia was then sized to a 5.  It was drilled and keeled and a 5 mm bridging bearing trial was placed.  Excellent range of motion and stability were achieved.  Attention was  turned to the patella.  It was cut down to a low of 14 mm and a 38 paddle was chosen and lugs were drilled, so this was placed at this point.  Once this was done, all the trial components were removed.  The knee was put through a range of motion prior to  that and had excellent stability and range of motion.  At this point, the knee was copiously and thoroughly lavaged with pulsatile lavage irrigation and suctioned dry.  Attention was turned back to the knee where the final components were cemented into place.  Size 5 tibia, size 5 femur, 5 mm bridging  bearing trial was placed and a 38 all poly patella was placed and held with a clamp.  All excess bone cement was removed.  The cement was allowed to completely hardened and once this had happened the tourniquet was let down and all bleeders controlled  with electrocautery.  Attention was turned back to the  knee where the trial 5 was removed and the final poly was placed after checking stability with the tourniquet down.  At this point, the wound was irrigated and suctioned dry.  The medial parapatellar  arthrotomy was closed with 1 Vicryl running, skin with 0 and 2-0 Vicryl and 3-0 Monocryl subcuticular.  Benzoin and Steri-Strips applied.  Sterile compressive dressing was applied.  The patient was taken to recovery and noted to be in satisfactory   condition.  Estimated blood loss for the procedure is minimal.  GN/NUANCE  D:06/15/2017 T:06/15/2017 JOB:[id number]

## 2017-06-15 NOTE — Anesthesia Preprocedure Evaluation (Addendum)
Anesthesia Evaluation  Patient identified by MRN, date of birth, ID band Patient awake    Reviewed: Allergy & Precautions, NPO status , Patient's Chart, lab work & pertinent test results  Airway Mallampati: I  TM Distance: >3 FB Neck ROM: Full    Dental   Pulmonary    Pulmonary exam normal        Cardiovascular Normal cardiovascular exam     Neuro/Psych    GI/Hepatic GERD  Medicated and Controlled,  Endo/Other  diabetes, Type 2, Oral Hypoglycemic Agents  Renal/GU      Musculoskeletal   Abdominal   Peds  Hematology   Anesthesia Other Findings   Reproductive/Obstetrics                             Anesthesia Physical Anesthesia Plan  ASA: III  Anesthesia Plan: Spinal   Post-op Pain Management:  Regional for Post-op pain   Induction: Intravenous  PONV Risk Score and Plan: 3 and Ondansetron and Midazolam  Airway Management Planned: Simple Face Mask  Additional Equipment:   Intra-op Plan:   Post-operative Plan:   Informed Consent: I have reviewed the patients History and Physical, chart, labs and discussed the procedure including the risks, benefits and alternatives for the proposed anesthesia with the patient or authorized representative who has indicated his/her understanding and acceptance.     Plan Discussed with: CRNA and Surgeon  Anesthesia Plan Comments:        Anesthesia Quick Evaluation

## 2017-06-15 NOTE — Anesthesia Postprocedure Evaluation (Signed)
Anesthesia Post Note  Patient: Amanda [name]  Procedure(s) Performed: LEFT TOTAL KNEE ARTHROPLASTY (Left Knee)     Patient location during evaluation: PACU Anesthesia Type: Spinal Level of consciousness: oriented and awake and alert Pain management: pain level controlled Vital Signs Assessment: post-procedure vital signs reviewed and stable Respiratory status: spontaneous breathing, respiratory function stable and patient connected to nasal cannula oxygen Cardiovascular status: blood pressure returned to baseline and stable Postop Assessment: no headache, no backache and no apparent nausea or vomiting Anesthetic complications: no    Last Vitals:  Vitals:   06/15/17 1445 06/15/17 1504  BP:  120/64  Pulse:  61  Resp:  17  Temp: 36.9 C 36.4 C  SpO2:  100%    Last Pain:  Vitals:   06/15/17 1513  TempSrc:   PainSc: 0-No pain                 Tafari Humiston DAVID

## 2017-06-15 NOTE — Anesthesia Procedure Notes (Signed)
Spinal  Patient location during procedure: OR Start time: 06/15/2017 11:04 AM End time: 06/15/2017 11:08 AM Staffing Anesthesiologist: Lillia Abed, MD Performed: anesthesiologist  Preanesthetic Checklist Completed: patient identified, surgical consent, pre-op evaluation, timeout performed, IV checked, risks and benefits discussed and monitors and equipment checked Spinal Block Patient position: sitting Prep: DuraPrep Patient monitoring: blood pressure, continuous pulse ox, cardiac monitor and heart rate Approach: right paramedian Location: L3-4 Injection technique: single-shot Needle Needle type: Pencan  Needle gauge: 24 G Needle length: 9 cm Needle insertion depth: 5 cm

## 2017-06-15 NOTE — Progress Notes (Signed)
AssistedDr. Ossey with left, ultrasound guided, adductor canal block. Side rails up, monitors on throughout procedure. See vital signs in flow sheet. Tolerated Procedure well.  

## 2017-06-15 NOTE — Brief Op Note (Signed)
06/15/2017  12:44 PM  PATIENT:  Amanda [name]  72 y.o. female  PRE-OPERATIVE DIAGNOSIS:  LEFT KNEE DEGENERATIVE JOINT DISEASE  POST-OPERATIVE DIAGNOSIS:  LEFT KNEE DEGENERATIVE JOINT DISEASE  PROCEDURE:  Procedure(s): LEFT TOTAL KNEE ARTHROPLASTY (Left)  SURGEON:  Surgeon(s) and Role:    Dorna Leitz, MD - Primary  PHYSICIAN ASSISTANT:   ASSISTANTS: bethune   ANESTHESIA:   spinal  EBL:  50 mL   BLOOD ADMINISTERED:none  DRAINS: none   LOCAL MEDICATIONS USED:  MARCAINE    and OTHER Exparel  SPECIMEN:  No Specimen  DISPOSITION OF SPECIMEN:  N/A  COUNTS:  YES  TOURNIQUET:   Total Tourniquet Time Documented: Thigh (Left) - 47 minutes Total: Thigh (Left) - 47 minutes   DICTATION: .Other Dictation: Dictation Number [id number]  PLAN OF CARE: Admit to inpatient   PATIENT DISPOSITION:  PACU - hemodynamically stable.   Delay start of Pharmacological VTE agent (>24hrs) due to surgical blood loss or risk of bleeding: no

## 2017-06-15 NOTE — Evaluation (Signed)
Physical Therapy Evaluation Patient Details Name: Amanda [name] MRN: [id number] DOB: 1945/11/12 Today's Date: 06/15/2017   History of Present Illness  Pt s/p L TKR and with hx of R TKR(2015), DM, and breast CA  Clinical Impression  Pt s/p L TKR and presents with decreased L LE strength/ROM and post op pain limiting functional mobility.  Pt should progress to dc home with family assist and HHPT follow up.    Follow Up Recommendations Home health PT;Follow surgeon's recommendation for DC plan and follow-up therapies    Equipment Recommendations  None recommended by PT    Recommendations for Other Services       Precautions / Restrictions Precautions Precautions: Fall;Knee Required Braces or Orthoses: Knee Immobilizer - Left Knee Immobilizer - Left: Discontinue once straight leg raise with < 10 degree lag Restrictions Weight Bearing Restrictions: No Other Position/Activity Restrictions: WBAT      Mobility  Bed Mobility Overal bed mobility: Needs Assistance Bed Mobility: Supine to Sit;Sit to Supine     Supine to sit: Min assist Sit to supine: Min assist   General bed mobility comments: min cues for sequence with min assist for L LE management  Transfers Overall transfer level: Needs assistance Equipment used: Rolling walker (2 wheeled) Transfers: Sit to/from Stand Sit to Stand: Min assist         General transfer comment: cues for LE  management and use of UEs to self assist  Ambulation/Gait Ambulation/Gait assistance: Min assist Ambulation Distance (Feet): 74 Feet Assistive device: Rolling walker (2 wheeled) Gait Pattern/deviations: Decreased step length - right;Decreased step length - left;Shuffle;Step-to pattern;Step-through pattern;Trunk flexed Gait velocity: decr   General Gait Details: cues for posture, position from RW and initial sequence  Stairs            Wheelchair Mobility    Modified Rankin (Stroke Patients Only)       Balance                                             Pertinent Vitals/Pain Pain Assessment: 0-10 Pain Score: 5  Pain Location: L knee Pain Descriptors / Indicators: Aching;Sore Pain Intervention(s): Limited activity within patient's tolerance;Monitored during session;Premedicated before session;Ice applied    Home Living Family/patient expects to be discharged to:: Private residence Living Arrangements: Alone Available Help at Discharge: Available 24 hours/day(DTR) Type of Home: House Home Access: Stairs to enter Entrance Stairs-Rails: Right;Left;Can reach both Entrance Stairs-Number of Steps: 3 Home Layout: One level Home Equipment: Walker - 2 wheels;Cane - single point;Crutches;Bedside commode      Prior Function Level of Independence: Independent               Hand Dominance        Extremity/Trunk Assessment   Upper Extremity Assessment Upper Extremity Assessment: Overall WFL for tasks assessed    Lower Extremity Assessment Lower Extremity Assessment: LLE deficits/detail    Cervical / Trunk Assessment Cervical / Trunk Assessment: Normal  Communication   Communication: No difficulties  Cognition Arousal/Alertness: Awake/alert Behavior During Therapy: WFL for tasks assessed/performed Overall Cognitive Status: Within Functional Limits for tasks assessed                                        General Comments  Exercises     Assessment/Plan    PT Assessment Patient needs continued PT services  PT Problem List Decreased strength;Decreased range of motion;Decreased activity tolerance;Decreased mobility;Decreased knowledge of use of DME;Pain       PT Treatment Interventions DME instruction;Gait training;Stair training;Functional mobility training;Therapeutic activities;Therapeutic exercise;Patient/family education    PT Goals (Current goals can be found in the Care Plan section)  Acute Rehab PT Goals Patient Stated Goal: Regain  IND PT Goal Formulation: With patient Time For Goal Achievement: 06/22/17 Potential to Achieve Goals: Good    Frequency 7X/week   Barriers to discharge        Co-evaluation               AM-PAC PT "6 Clicks" Daily Activity  Outcome Measure Difficulty turning over in bed (including adjusting bedclothes, sheets and blankets)?: A Lot Difficulty moving from lying on back to sitting on the side of the bed? : A Lot Difficulty sitting down on and standing up from a chair with arms (e.g., wheelchair, bedside commode, etc,.)?: A Lot Help needed moving to and from a bed to chair (including a wheelchair)?: A Little Help needed walking in hospital room?: A Little Help needed climbing 3-5 steps with a railing? : A Little 6 Click Score: 15    End of Session Equipment Utilized During Treatment: Gait belt;Left knee immobilizer Activity Tolerance: Patient tolerated treatment well Patient left: in bed;with call bell/phone within reach Nurse Communication: Mobility status PT Visit Diagnosis: Difficulty in walking, not elsewhere classified (R26.2)    Time: 4665-9935 PT Time Calculation (min) (ACUTE ONLY): 17 min   Charges:   PT Evaluation $PT Eval Low Complexity: 1 Low     PT G Codes:        Pg [phone]   Chyla Schlender 06/15/2017, 5:43 PM

## 2017-06-15 NOTE — Anesthesia Procedure Notes (Signed)
Date/Time: 06/15/2017 11:10 AM Performed by: Talbot Grumbling, CRNA Oxygen Delivery Method: Simple face mask

## 2017-06-15 NOTE — Anesthesia Procedure Notes (Signed)
Anesthesia Regional Block: Adductor canal block   Pre-Anesthetic Checklist: ,, timeout performed, Correct Patient, Correct Site, Correct Laterality, Correct Procedure, Correct Position, site marked, Risks and benefits discussed,  Surgical consent,  Pre-op evaluation,  At surgeon's request and post-op pain management  Laterality: Left  Prep: chloraprep       Needles:  Injection technique: Single-shot  Needle Type: Echogenic Stimulator Needle     Needle Length: 9cm  Needle Gauge: 21     Additional Needles:   Narrative:  Start time: 06/15/2017 9:45 AM End time: 06/15/2017 9:55 AM Injection made incrementally with aspirations every 5 mL.  Performed by: Personally  Anesthesiologist: Lillia Abed, MD  Additional Notes: Monitors applied. Patient sedated. Sterile prep and drape,hand hygiene and sterile gloves were used. Relevant anatomy identified.Needle position confirmed.Local anesthetic injected incrementally after negative aspiration. Local anesthetic spread visualized around nerve(s). Vascular puncture avoided. No complications. Image printed for medical record.The patient tolerated the procedure well.    Lillia Abed MD

## 2017-06-16 LAB — GLUCOSE, CAPILLARY
GLUCOSE-CAPILLARY: 123 mg/dL — AB (ref 65–99)
Glucose-Capillary: 105 mg/dL — ABNORMAL HIGH (ref 65–99)
Glucose-Capillary: 153 mg/dL — ABNORMAL HIGH (ref 65–99)
Glucose-Capillary: 153 mg/dL — ABNORMAL HIGH (ref 65–99)

## 2017-06-16 LAB — BASIC METABOLIC PANEL
Anion gap: 11 (ref 5–15)
BUN: 15 mg/dL (ref 6–20)
CO2: 21 mmol/L — ABNORMAL LOW (ref 22–32)
CREATININE: 0.87 mg/dL (ref 0.44–1.00)
Calcium: 9.4 mg/dL (ref 8.9–10.3)
Chloride: 106 mmol/L (ref 101–111)
GFR calc Af Amer: >60 mL/min (ref >60)
Glucose, Bld: 159 mg/dL — ABNORMAL HIGH (ref 65–99)
Potassium: 4.5 mmol/L (ref 3.5–5.1)
SODIUM: 138 mmol/L (ref 135–145)

## 2017-06-16 LAB — CBC
HCT: 37.5 % (ref 36.0–46.0)
Hemoglobin: 12.2 g/dL (ref 12.0–15.0)
MCH: 31 pg (ref 26.0–34.0)
MCHC: 32.5 g/dL (ref 30.0–36.0)
MCV: 95.2 fL (ref 78.0–100.0)
PLATELETS: 312 10*3/uL (ref 150–400)
RBC: 3.94 MIL/uL (ref 3.87–5.11)
RDW: 15.5 % (ref 11.5–15.5)
WBC: 11.3 10*3/uL — ABNORMAL HIGH (ref 4.0–10.5)

## 2017-06-16 NOTE — Progress Notes (Signed)
Physical Therapy Treatment Patient Details Name: Amanda [name] MRN: [id number] DOB: 1945-05-09 Today's Date: 06/16/2017    History of Present Illness Pt s/p L TKR and with hx of R TKR(2015), DM, and breast CA    PT Comments    Pt motivated and progressing well with mobility.  Pt hopeful for dc home tomorrow morning.  Follow Up Recommendations  Home health PT;Follow surgeon's recommendation for DC plan and follow-up therapies     Equipment Recommendations  None recommended by PT    Recommendations for Other Services       Precautions / Restrictions Precautions Precautions: Fall;Knee Required Braces or Orthoses: Knee Immobilizer - Left Knee Immobilizer - Left: Discontinue once straight leg raise with < 10 degree lag Restrictions Weight Bearing Restrictions: No Other Position/Activity Restrictions: WBAT    Mobility  Bed Mobility Overal bed mobility: Needs Assistance Bed Mobility: Supine to Sit;Sit to Supine     Supine to sit: Min guard Sit to supine: Supervision   General bed mobility comments: min cues for sequence with min guard for L LE management  Transfers Overall transfer level: Needs assistance Equipment used: Rolling walker (2 wheeled) Transfers: Sit to/from Stand Sit to Stand: Min guard;Supervision         General transfer comment: cues for LE  management and use of UEs to self assist  Ambulation/Gait Ambulation/Gait assistance: Min guard Ambulation Distance (Feet): 174 Feet Assistive device: Rolling walker (2 wheeled) Gait Pattern/deviations: Decreased step length - right;Decreased step length - left;Shuffle;Step-to pattern;Step-through pattern;Trunk flexed Gait velocity: decr   General Gait Details: cues for posture, position from RW and initial sequence   Stairs             Wheelchair Mobility    Modified Rankin (Stroke Patients Only)       Balance                                            Cognition  Arousal/Alertness: Awake/alert Behavior During Therapy: WFL for tasks assessed/performed Overall Cognitive Status: Within Functional Limits for tasks assessed                                        Exercises Total Joint Exercises Ankle Circles/Pumps: AROM;Both;15 reps;Supine Quad Sets: AROM;Both;10 reps;Supine Heel Slides: AAROM;Left;15 reps;Supine Straight Leg Raises: Left;Supine;AAROM;AROM;15 reps Goniometric ROM: AAROM L knee -10 - 50    General Comments        Pertinent Vitals/Pain Pain Assessment: 0-10 Pain Score: 6  Pain Location: L knee Pain Descriptors / Indicators: Aching;Sore Pain Intervention(s): Limited activity within patient's tolerance;Monitored during session;Patient requesting pain meds-RN notified;Ice applied    Home Living                      Prior Function            PT Goals (current goals can now be found in the care plan section) Acute Rehab PT Goals Patient Stated Goal: Regain IND PT Goal Formulation: With patient Time For Goal Achievement: 06/22/17 Potential to Achieve Goals: Good Progress towards PT goals: Progressing toward goals    Frequency    7X/week      PT Plan Current plan remains appropriate    Co-evaluation  AM-PAC PT "6 Clicks" Daily Activity  Outcome Measure  Difficulty turning over in bed (including adjusting bedclothes, sheets and blankets)?: A Little Difficulty moving from lying on back to sitting on the side of the bed? : A Little Difficulty sitting down on and standing up from a chair with arms (e.g., wheelchair, bedside commode, etc,.)?: A Little Help needed moving to and from a bed to chair (including a wheelchair)?: A Little Help needed walking in hospital room?: A Little Help needed climbing 3-5 steps with a railing? : A Little 6 Click Score: 18    End of Session Equipment Utilized During Treatment: Gait belt Activity Tolerance: Patient tolerated treatment  well Patient left: in bed;with call bell/phone within reach Nurse Communication: Mobility status PT Visit Diagnosis: Difficulty in walking, not elsewhere classified (R26.2)     Time: 5361-4431 PT Time Calculation (min) (ACUTE ONLY): 23 min  Charges:  $Gait Training: 8-22 mins $Therapeutic Exercise: 8-22 mins                    G Codes:       Pg [phone]    Lorian Yaun 06/16/2017, 3:40 PM

## 2017-06-16 NOTE — Progress Notes (Signed)
Subjective: 1 Day Post-Op Procedure(s) (LRB): LEFT TOTAL KNEE ARTHROPLASTY (Left) Patient reports pain as moderate.    Objective: Vital signs in last 24 hours: Temp:  [97.5 F (36.4 C)-98.7 F (37.1 C)] 98.4 F (36.9 C) (05/11 0518) Pulse Rate:  [57-79] 66 (05/11 0518) Resp:  [9-19] 17 (05/11 0518) BP: (103-163)/(47-76) 132/64 (05/11 0518) SpO2:  [94 %-100 %] 96 % (05/11 0518)  Intake/Output from previous day: 05/10 0701 - 05/11 0700 In: 3572.7 [P.O.:570; I.V.:2692.7; IV Piggyback:310] Out: 3600 [Urine:3550; Blood:50] Intake/Output this shift: No intake/output data recorded.  Recent Labs    06/16/17 0520  HGB 12.2   Recent Labs    06/16/17 0520  WBC 11.3*  RBC 3.94  HCT 37.5  PLT 312   Recent Labs    06/16/17 0520  NA 138  K 4.5  CL 106  CO2 21*  BUN 15  CREATININE 0.87  GLUCOSE 159*  CALCIUM 9.4   No results for input(s): LABPT, INR in the last 72 hours.  Neurologically intact ABD soft Neurovascular intact Sensation intact distally No cellulitis present Compartment soft  Anticipated LOS equal to or greater than 2 midnights due to - Age 41 and older with one or more of the following:  - Obesity  - Expected need for hospital services (PT, OT, Nursing) required for safe  discharge  - Anticipated need for postoperative skilled nursing care or inpatient rehab  - Active co-morbidities: Diabetes, Coronary Artery Disease and need for better home support OR   - Unanticipated findings during/Post Surgery: None  - Patient is a high risk of re-admission due to: Barriers to post-acute care (logistical, no family support in home)   Assessment/Plan: 1 Day Post-Op Procedure(s) (LRB): LEFT TOTAL KNEE ARTHROPLASTY (Left) Advance diet Up with therapy Plan for discharge tomorrow after additional PT today and tomorrow AM    Alta Corning 06/16/2017, 8:28 AM

## 2017-06-16 NOTE — Progress Notes (Signed)
Physical Therapy Treatment Patient Details Name: Amanda [name] MRN: [id number] DOB: August 15, 1945 Today's Date: 06/16/2017    History of Present Illness Pt s/p L TKR and with hx of R TKR(2015), DM, and breast CA    PT Comments    Pt motivated and progressing well with mobility.  Pt hopeful for dc home in am.   Follow Up Recommendations  Home health PT;Follow surgeon's recommendation for DC plan and follow-up therapies     Equipment Recommendations  None recommended by PT    Recommendations for Other Services       Precautions / Restrictions Precautions Precautions: Fall;Knee Required Braces or Orthoses: Knee Immobilizer - Left Knee Immobilizer - Left: Discontinue once straight leg raise with < 10 degree lag(Pt performed IND SLR this am) Restrictions Weight Bearing Restrictions: No Other Position/Activity Restrictions: WBAT    Mobility  Bed Mobility Overal bed mobility: Needs Assistance Bed Mobility: Supine to Sit     Supine to sit: Min guard     General bed mobility comments: min cues for sequence with min guard for L LE management  Transfers Overall transfer level: Needs assistance Equipment used: Rolling walker (2 wheeled) Transfers: Sit to/from Stand Sit to Stand: Min guard         General transfer comment: cues for LE  management and use of UEs to self assist  Ambulation/Gait Ambulation/Gait assistance: Min assist;Min guard Ambulation Distance (Feet): 120 Feet Assistive device: Rolling walker (2 wheeled) Gait Pattern/deviations: Decreased step length - right;Decreased step length - left;Shuffle;Step-to pattern;Step-through pattern;Trunk flexed Gait velocity: decr   General Gait Details: cues for posture, position from RW and initial sequence   Stairs             Wheelchair Mobility    Modified Rankin (Stroke Patients Only)       Balance                                            Cognition Arousal/Alertness:  Awake/alert Behavior During Therapy: WFL for tasks assessed/performed Overall Cognitive Status: Within Functional Limits for tasks assessed                                        Exercises Total Joint Exercises Ankle Circles/Pumps: AROM;Both;15 reps;Supine Quad Sets: AROM;Both;10 reps;Supine Heel Slides: AAROM;Left;15 reps;Supine Straight Leg Raises: Left;10 reps;Supine;AAROM;AROM Goniometric ROM: AAROM L knee -10 - 50    General Comments        Pertinent Vitals/Pain Pain Assessment: 0-10 Pain Score: 6  Pain Location: L knee Pain Descriptors / Indicators: Aching;Sore Pain Intervention(s): Limited activity within patient's tolerance;Monitored during session;Premedicated before session;Ice applied    Home Living                      Prior Function            PT Goals (current goals can now be found in the care plan section) Acute Rehab PT Goals Patient Stated Goal: Regain IND PT Goal Formulation: With patient Time For Goal Achievement: 06/22/17 Potential to Achieve Goals: Good Progress towards PT goals: Progressing toward goals    Frequency    7X/week      PT Plan Current plan remains appropriate    Co-evaluation  AM-PAC PT "6 Clicks" Daily Activity  Outcome Measure  Difficulty turning over in bed (including adjusting bedclothes, sheets and blankets)?: A Lot Difficulty moving from lying on back to sitting on the side of the bed? : A Lot Difficulty sitting down on and standing up from a chair with arms (e.g., wheelchair, bedside commode, etc,.)?: A Lot Help needed moving to and from a bed to chair (including a wheelchair)?: A Little Help needed walking in hospital room?: A Little Help needed climbing 3-5 steps with a railing? : A Little 6 Click Score: 15    End of Session Equipment Utilized During Treatment: Gait belt;Left knee immobilizer Activity Tolerance: Patient tolerated treatment well Patient left: in  chair;with call bell/phone within reach;with family/visitor present Nurse Communication: Mobility status PT Visit Diagnosis: Difficulty in walking, not elsewhere classified (R26.2)     Time: 2500-3704 PT Time Calculation (min) (ACUTE ONLY): 30 min  Charges:  $Gait Training: 8-22 mins $Therapeutic Exercise: 8-22 mins                    G Codes:       Pg [phone]    Fatimata Talsma 06/16/2017, 12:35 PM

## 2017-06-17 LAB — CBC
HCT: 33.8 % — ABNORMAL LOW (ref 36.0–46.0)
Hemoglobin: 11 g/dL — ABNORMAL LOW (ref 12.0–15.0)
MCH: 31.2 pg (ref 26.0–34.0)
MCHC: 32.5 g/dL (ref 30.0–36.0)
MCV: 95.8 fL (ref 78.0–100.0)
PLATELETS: 271 10*3/uL (ref 150–400)
RBC: 3.53 MIL/uL — ABNORMAL LOW (ref 3.87–5.11)
RDW: 15.6 % — AB (ref 11.5–15.5)
WBC: 10.7 10*3/uL — ABNORMAL HIGH (ref 4.0–10.5)

## 2017-06-17 LAB — GLUCOSE, CAPILLARY: Glucose-Capillary: 113 mg/dL — ABNORMAL HIGH (ref 65–99)

## 2017-06-17 NOTE — Progress Notes (Signed)
Subjective: 2 Days Post-Op Procedure(s) (LRB): LEFT TOTAL KNEE ARTHROPLASTY (Left) Patient reports pain as moderate.  Taking by mouth and voiding okay.  Good progress with physical therapy.  Objective: Vital signs in last 24 hours: Temp:  [98.1 F (36.7 C)-98.7 F (37.1 C)] 98.2 F (36.8 C) (05/12 0547) Pulse Rate:  [68-77] 77 (05/12 0547) Resp:  [16-18] 16 (05/12 0547) BP: (120-152)/(53-74) 147/74 (05/12 0547) SpO2:  [92 %-98 %] 96 % (05/12 0547)  Intake/Output from previous day: 05/11 0701 - 05/12 0700 In: 880 [P.O.:480; I.V.:400] Out: -  Intake/Output this shift: No intake/output data recorded.  Recent Labs    06/16/17 0520 06/17/17 0505  HGB 12.2 11.0*   Recent Labs    06/16/17 0520 06/17/17 0505  WBC 11.3* 10.7*  RBC 3.94 3.53*  HCT 37.5 33.8*  PLT 312 271   Recent Labs    06/16/17 0520  NA 138  K 4.5  CL 106  CO2 21*  BUN 15  CREATININE 0.87  GLUCOSE 159*  CALCIUM 9.4   No results for input(s): LABPT, INR in the last 72 hours. Left knee exam: Neurovascular intact Sensation intact distally Intact pulses distally Dorsiflexion/Plantar flexion intact Incision: dressing C/D/I Compartment soft  Anticipated LOS equal to or greater than 2 midnights due to - Age 72 and older with one or more of the following:  - Obesity  - Expected need for hospital services (PT, OT, Nursing) required for safe  discharge  - Anticipated need for postoperative skilled nursing care or inpatient rehab  - Active co-morbidities: Chronic pain requiring opiods OR   - Unanticipated findings during/Post Surgery: None  - Patient is a high risk of re-admission due to: Barriers to post-acute care (logistical, no family support in home)   Assessment/Plan: 2 Days Post-Op Procedure(s) (LRB): LEFT TOTAL KNEE ARTHROPLASTY (Left) Plan: Weight-bear as tolerated on left lower extremity. Aspirin 325 mg twice daily for DVT prophylaxis x1 month. Up with therapy Discharge home with  home health Follow-up with Dr. Berenice Primas in 2 weeks.    Erlene Senters 06/17/2017, 10:27 AM

## 2017-06-17 NOTE — Care Management Note (Signed)
Case Management Note  Patient Details  Name: TANJI STORRS MRN: [id number] Date of Birth: [birth year]  Subjective/Objective:    Pt presents for TKR.              Action/Plan: Discussed plan with HH with pt.  Pt states she was set [facility] with [facility].  Jermaine with [facility] notified of patient d/c today.   Expected Discharge Date:  06/17/17               Expected Discharge Plan:  [place]  In-House Referral:  NA  Discharge planning Services  CM Consult  Post Acute Care Choice:  Home Health Choice offered to:  Patient  DME Arranged:   n/a DME Agency:   n/a  HH Arranged:  PT [place]:  [facility](pt states set up with [facility] preoperatively)  Status of Service:  Completed, signed off  If discussed at [place] of Stay Meetings, dates discussed:    Additional Comments:  Claudie Leach, RN 06/17/2017, 11:30 AM

## 2017-06-17 NOTE — Progress Notes (Signed)
Discharged from floor via w/c for transport home by car. Belongings & family with pt. No changes in assessment. [employer]  

## 2017-06-17 NOTE — Discharge Instructions (Signed)
INSTRUCTIONS AFTER JOINT REPLACEMENT   o Remove items at home which could result in a fall. This includes throw rugs or furniture in walking pathways o ICE to the affected joint every three hours while awake for 30 minutes at a time, for at least the first 3-5 days, and then as needed for pain and swelling.  Continue to use ice for pain and swelling. You may notice swelling that will progress down to the foot and ankle.  This is normal after surgery.  Elevate your leg when you are not up walking on it.   o Continue to use the breathing machine you got in the hospital (incentive spirometer) which will help keep your temperature down.  It is common for your temperature to cycle up and down following surgery, especially at night when you are not up moving around and exerting yourself.  The breathing machine keeps your lungs expanded and your temperature down.   DIET:  As you were doing prior to hospitalization, we recommend a well-balanced diet.  Home Health PT set up with [facility] / WOUND CARE / SHOWERING  Keep the surgical dressing until follow up.  The dressing is water proof, so you can shower without any extra covering.  IF THE DRESSING FALLS OFF or the wound gets wet inside, change the dressing with sterile gauze.  Please use good hand washing techniques before changing the dressing.  Do not use any lotions or creams on the incision until instructed by your surgeon.    ACTIVITY  o Increase activity slowly as tolerated, but follow the weight bearing instructions below.   o No driving for 6 weeks or until further direction given by your physician.  You cannot drive while taking narcotics.  o No lifting or carrying greater than 10 lbs. until further directed by your surgeon. o Avoid periods of inactivity such as sitting longer than an hour when not asleep. This helps prevent blood clots.  o You may return to work once you are authorized by your doctor.     WEIGHT BEARING     Weight bearing as tolerated with assist device (walker, cane, etc) as directed, use it as long as suggested by your surgeon or therapist, typically at least 4-6 weeks.   EXERCISES  Results after joint replacement surgery are often greatly improved when you follow the exercise, range of motion and muscle strengthening exercises prescribed by your doctor. Safety measures are also important to protect the joint from further injury. Any time any of these exercises cause you to have increased pain or swelling, decrease what you are doing until you are comfortable again and then slowly increase them. If you have problems or questions, call your caregiver or physical therapist for advice.   Rehabilitation is important following a joint replacement. After just a few days of immobilization, the muscles of the leg can become weakened and shrink (atrophy).  These exercises are designed to build up the tone and strength of the thigh and leg muscles and to improve motion. Often times heat used for twenty to thirty minutes before working out will loosen up your tissues and help with improving the range of motion but do not use heat for the first two weeks following surgery (sometimes heat can increase post-operative swelling).   These exercises can be done on a training (exercise) mat, on the floor, on a table or on a bed. Use whatever works the best and is most comfortable for you.  Use music or television while you are exercising so that the exercises are a pleasant break in your day. This will make your life better with the exercises acting as a break in your routine that you can look forward to.   Perform all exercises about fifteen times, three times per day or as directed.  You should exercise both the operative leg and the other leg as well.  Exercises include:    Quad Sets - Tighten up the muscle on the front of the thigh (Quad) and hold for 5-10 seconds.    Straight Leg Raises - With your knee  straight (if you were given a brace, keep it on), lift the leg to 60 degrees, hold for 3 seconds, and slowly lower the leg.  Perform this exercise against resistance later as your leg gets stronger.   Leg Slides: Lying on your back, slowly slide your foot toward your buttocks, bending your knee up off the floor (only go as far as is comfortable). Then slowly slide your foot back down until your leg is flat on the floor again.   Angel Wings: Lying on your back spread your legs to the side as far apart as you can without causing discomfort.   Hamstring Strength:  Lying on your back, push your heel against the floor with your leg straight by tightening up the muscles of your buttocks.  Repeat, but this time bend your knee to a comfortable angle, and push your heel against the floor.  You may put a pillow under the heel to make it more comfortable if necessary.   A rehabilitation program following joint replacement surgery can speed recovery and prevent re-injury in the future due to weakened muscles. Contact your doctor or a physical therapist for more information on knee rehabilitation.    CONSTIPATION  Constipation is defined medically as fewer than three stools per week and severe constipation as less than one stool per week.  Even if you have a regular bowel pattern at home, your normal regimen is likely to be disrupted due to multiple reasons following surgery.  Combination of anesthesia, postoperative narcotics, change in appetite and fluid intake all can affect your bowels.   YOU MUST use at least one of the following options; they are listed in order of increasing strength to get the job done.  They are all available over the counter, and you may need to use some, POSSIBLY even all of these options:    Drink plenty of fluids (prune juice may be helpful) and high fiber foods Colace 100 mg by mouth twice a day  Senokot for constipation as directed and as needed Dulcolax (bisacodyl), take with  full glass of water  Miralax (polyethylene glycol) once or twice a day as needed.  If you have tried all these things and are unable to have a bowel movement in the first 3-4 days after surgery call either your surgeon or your primary doctor.    If you experience loose stools or diarrhea, hold the medications until you stool forms back up.  If your symptoms do not get better within 1 week or if they get worse, check with your doctor.  If you experience "the worst abdominal pain ever" or develop nausea or vomiting, please contact the office immediately for further recommendations for treatment.   ITCHING:  If you experience itching with your medications, try taking only a single pain pill, or even half a pain pill at a time.  You can also  use Benadryl over the counter for itching or also to help with sleep.   TED HOSE STOCKINGS:  Use stockings on both legs until for at least 2 weeks or as directed by physician office. They may be removed at night for sleeping.  MEDICATIONS:  See your medication summary on the After Visit Summary that nursing will review with you.  You may have some home medications which will be placed on hold until you complete the course of blood thinner medication.  It is important for you to complete the blood thinner medication as prescribed.  PRECAUTIONS:  If you experience chest pain or shortness of breath - call 911 immediately for transfer to the hospital emergency department.   If you develop a fever greater that 101 F, purulent drainage from wound, increased redness or drainage from wound, foul odor from the wound/dressing, or calf pain - CONTACT YOUR SURGEON.                                                   FOLLOW-UP APPOINTMENTS:  If you do not already have a post-op appointment, please call the office for an appointment to be seen by your surgeon.  Guidelines for how soon to be seen are listed in your After Visit Summary, but are typically between 1-4 weeks after  surgery.  OTHER INSTRUCTIONS:   Knee Replacement:  Do not place pillow under knee, focus on keeping the knee straight while resting. CPM instructions: 0-90 degrees, 2 hours in the morning, 2 hours in the afternoon, and 2 hours in the evening. Place foam block, curve side up under heel at all times except when in CPM or when walking.  DO NOT modify, tear, cut, or change the foam block in any way.  MAKE SURE YOU:   Understand these instructions.   Get help right away if you are not doing well or get worse.    Thank you for letting us be a part of your medical care team.  It is a privilege we respect greatly.  We hope these instructions will help you stay on track for a fast and full recovery!

## 2017-06-17 NOTE — Evaluation (Signed)
Physical Therapy Evaluation Patient Details Name: Amanda [name] MRN: [id number] DOB: 1945/11/30 Today's Date: 06/17/2017   History of Present Illness  Pt s/p L TKR and with hx of R TKR(2015), DM, and breast CA  Clinical Impression  Pt progressing well with mobility and eager for dc home.  Reviewed stairs and home therex with written instructions provided.    Follow Up Recommendations Home health PT;Follow surgeon's recommendation for DC plan and follow-up therapies    Equipment Recommendations  None recommended by PT    Recommendations for Other Services       Precautions / Restrictions Precautions Precautions: Fall;Knee Restrictions Weight Bearing Restrictions: No Other Position/Activity Restrictions: WBAT      Mobility  Bed Mobility Overal bed mobility: Needs Assistance Bed Mobility: Supine to Sit;Sit to Supine     Supine to sit: Mod assist Sit to supine: Supervision      Transfers Overall transfer level: Needs assistance Equipment used: Rolling walker (2 wheeled) Transfers: Sit to/from Stand Sit to Stand: Supervision         General transfer comment: pt self cues for LE  management and use of UEs to self assist  Ambulation/Gait Ambulation/Gait assistance: Supervision Ambulation Distance (Feet): 150 Feet Assistive device: Rolling walker (2 wheeled) Gait Pattern/deviations: Decreased step length - right;Decreased step length - left;Shuffle;Step-to pattern;Step-through pattern;Trunk flexed Gait velocity: decr   General Gait Details: min cues for posture, position from RW and initial sequence  Stairs Stairs: Yes Stairs assistance: Min guard Stair Management: One rail Right;Step to pattern;Forwards;With cane Number of Stairs: 5 General stair comments: cues for sequence and foot/cane placement  Wheelchair Mobility    Modified Rankin (Stroke Patients Only)       Balance                                             Pertinent  Vitals/Pain Pain Assessment: 0-10 Pain Score: 6  Pain Location: L knee Pain Descriptors / Indicators: Aching;Sore Pain Intervention(s): Limited activity within patient's tolerance;Monitored during session;Premedicated before session;Ice applied    Home Living                        Prior Function                 Hand Dominance        Extremity/Trunk Assessment                Communication      Cognition Arousal/Alertness: Awake/alert Behavior During Therapy: WFL for tasks assessed/performed Overall Cognitive Status: Within Functional Limits for tasks assessed                                        General Comments      Exercises Total Joint Exercises Ankle Circles/Pumps: AROM;Both;15 reps;Supine Quad Sets: AROM;Both;Supine;15 reps Heel Slides: AAROM;Left;Supine;20 reps Straight Leg Raises: Left;Supine;AAROM;AROM;20 reps Long Arc Quad: AAROM;Left;10 reps;Seated Goniometric ROM: AAROM L knee -8 - 70   Assessment/Plan    PT Assessment    PT Problem List         PT Treatment Interventions      PT Goals (Current goals can be found in the Care Plan section)  Acute Rehab PT Goals Patient Stated Goal: Regain IND PT  Goal Formulation: With patient Time For Goal Achievement: 06/22/17 Potential to Achieve Goals: Good    Frequency 7X/week   Barriers to discharge        Co-evaluation               AM-PAC PT "6 Clicks" Daily Activity  Outcome Measure Difficulty turning over in bed (including adjusting bedclothes, sheets and blankets)?: A Little Difficulty moving from lying on back to sitting on the side of the bed? : A Little Difficulty sitting down on and standing up from a chair with arms (e.g., wheelchair, bedside commode, etc,.)?: A Little Help needed moving to and from a bed to chair (including a wheelchair)?: None Help needed walking in hospital room?: None Help needed climbing 3-5 steps with a railing? : A  Little 6 Click Score: 20    End of Session Equipment Utilized During Treatment: Gait belt Activity Tolerance: Patient tolerated treatment well Patient left: in bed;with call bell/phone within reach Nurse Communication: Mobility status PT Visit Diagnosis: Difficulty in walking, not elsewhere classified (R26.2)    Time: 0827-0902 PT Time Calculation (min) (ACUTE ONLY): 35 min   Charges:     PT Treatments $Gait Training: 8-22 mins $Therapeutic Exercise: 8-22 mins   PT G Codes:        Pg [phone]   Meghana Tullo 06/17/2017, 10:39 AM

## 2017-06-17 NOTE — Discharge Summary (Signed)
Patient ID: Amanda [name] MRN: [id number] DOB/AGE: January 10, 1946 72 y.o.  Admit date: 06/15/2017 Discharge date: 06/17/2017  Admission Diagnoses:  Principal Problem:   Primary osteoarthritis of left knee   Discharge Diagnoses:  Same  Past Medical History:  Diagnosis Date  . Arthritis   . Breast cancer ([place])   . Breast cancer of upper-outer quadrant of right female breast ([place]) 03/24/2015  . Carpal tunnel syndrome   . Diabetes mellitus without complication ([place])    type 2  . GERD (gastroesophageal reflux disease)    Pt takes OTC when needed.  Marland Kitchen Hot flashes     Surgeries: Procedure(s): LEFT TOTAL KNEE ARTHROPLASTY on 06/15/2017   Consultants:   Discharged Condition: Improved  Hospital Course: Amanda [name] is an 72 y.o. female who was admitted 06/15/2017 for operative treatment ofPrimary osteoarthritis of left knee. Patient has severe unremitting pain that affects sleep, daily activities, and work/hobbies. After pre-op clearance the patient was taken to the operating room on 06/15/2017 and underwent  Procedure(s): LEFT TOTAL KNEE ARTHROPLASTY.    Patient was given perioperative antibiotics:  Anti-infectives (From admission, onward)   Start     Dose/Rate Route Frequency Ordered Stop   06/15/17 1800  ceFAZolin (ANCEF) IVPB 2g/100 mL premix     2 g 200 mL/hr over 30 Minutes Intravenous Every 6 hours 06/15/17 1457 06/15/17 2353   06/15/17 1130  ceFAZolin (ANCEF) IVPB 2g/100 mL premix  Status:  Discontinued     2 g 200 mL/hr over 30 Minutes Intravenous To ShortStay Surgical 06/14/17 1116 06/14/17 1322   06/15/17 0815  ceFAZolin (ANCEF) IVPB 2g/100 mL premix     2 g 200 mL/hr over 30 Minutes Intravenous On call to O.R. 06/15/17 0800 06/15/17 1120       Patient was given sequential compression devices, early ambulation, and chemoprophylaxis to prevent DVT.  Patient benefited maximally from hospital stay and there were no complications.    Recent vital signs:  Patient Vitals  for the past 24 hrs:  BP Temp Temp src Pulse Resp SpO2  06/17/17 0547 (!) 147/74 98.2 F (36.8 C) Oral 77 16 96 %  06/16/17 2201 (!) 152/73 98.1 F (36.7 C) Oral 72 16 92 %  06/16/17 1347 (!) 120/53 98.7 F (37.1 C) Oral 68 18 98 %     Recent laboratory studies:  Recent Labs    06/16/17 0520 06/17/17 0505  WBC 11.3* 10.7*  HGB 12.2 11.0*  HCT 37.5 33.8*  PLT 312 271  NA 138  --   K 4.5  --   CL 106  --   CO2 21*  --   BUN 15  --   CREATININE 0.87  --   GLUCOSE 159*  --   CALCIUM 9.4  --      Discharge Medications:   Allergies as of 06/17/2017      Reactions   Statins Other (See Comments)   Muscle pain. Pt able to take Crestor but not everyday.   Meperidine Hcl Rash      Medication List    STOP taking these medications   aspirin 81 MG tablet Replaced by:  aspirin EC 325 MG tablet     TAKE these medications   anastrozole 1 MG tablet Commonly known as:  ARIMIDEX Take 1 tablet (1 mg total) by mouth daily.   aspirin EC 325 MG tablet Take 1 tablet (325 mg total) by mouth 2 (two) times daily after a meal. Take x 1 month post op  to decrease risk of blood clots. Replaces:  aspirin 81 MG tablet   cloNIDine 0.1 mg/24hr patch Commonly known as:  CATAPRES - Dosed in mg/24 hr Place 1 patch (0.1 mg total) onto the skin once a week.   docusate sodium 100 MG capsule Commonly known as:  COLACE Take 1 capsule (100 mg total) by mouth 2 (two) times daily.   Dulaglutide 0.75 MG/0.5ML Sopn Commonly known as:  TRULICITY INJECT 1.61 MG UNDER THE SKIN ONCE A WEEK What changed:    how much to take  how to take this  when to take this  additional instructions   INVOKANA 300 MG Tabs tablet Generic drug:  canagliflozin take 1 tablet by mouth daily before the first meal of the day What changed:  See the new instructions.   olopatadine 0.1 % ophthalmic solution Commonly known as:  PATANOL Instill 1 drop in left eye twice daily   ONE TOUCH ULTRA TEST test  strip Generic drug:  glucose blood USE TO TEST BLOOD SUGAR ONE TIME DAILY AS INSTRUCTED   oxyCODONE-acetaminophen 5-325 MG tablet Commonly known as:  PERCOCET/ROXICET Take 1-2 tablets by mouth every 6 (six) hours as needed for severe pain.   Pitavastatin Calcium 4 MG Tabs Commonly known as:  LIVALO Take one tablet daily What changed:    how much to take  how to take this  when to take this  additional instructions   tiZANidine 2 MG tablet Commonly known as:  ZANAFLEX Take 1 tablet (2 mg total) by mouth every 8 (eight) hours as needed for muscle spasms.   Vitamin D3 2000 units Tabs Take 6,000 Units by mouth daily.            Discharge [name] Instructions  (From admission, onward)        Start     Ordered   06/17/17 0000  Weight bearing as tolerated    Question Answer Comment  Laterality left   Extremity Lower      06/17/17 1030      Diagnostic Studies: Dg Chest 2 View  Result Date: 06/07/2017 CLINICAL DATA:  Preop for left total knee arthroplasty. EXAM: CHEST - 2 VIEW COMPARISON:  Radiographs of July 10, 2013. FINDINGS: The heart size and mediastinal contours are within normal limits. Both lungs are clear. The visualized skeletal structures are unremarkable. IMPRESSION: No active cardiopulmonary disease. Electronically Signed   By: Marijo Conception, M.D.   On: 06/07/2017 10:08    Disposition: Discharge disposition: 01-Home or Self [name]       Discharge Instructions    CPM   Complete by:  As directed    Continuous passive motion machine (CPM):      Use the CPM from 0 to 60 for 8 hours per day.      You may increase by 5-10 per day.  You may break it up into 2 or 3 sessions per day.      Use CPM for 1-2 weeks or until you are told to stop.   Call MD / Call 911   Complete by:  As directed    If you experience chest pain or shortness of breath, CALL 911 and be transported to the hospital emergency room.  If you develope a fever above 101 F, pus (white  drainage) or increased drainage or redness at the wound, or calf pain, call your surgeon's office.   Constipation Prevention   Complete by:  As directed    Drink plenty of fluids.  Prune juice may be helpful.  You may use a stool softener, such as Colace (over the counter) 100 mg twice a day.  Use MiraLax (over the counter) for constipation as needed.   Diet Carb Modified   Complete by:  As directed    Do not put a pillow under the knee. Place it under the heel.   Complete by:  As directed    Face-to-face encounter (required for Medicare/Medicaid patients)   Complete by:  As directed    I Erlene Senters certify that this patient is under my [name] and that I, or a nurse practitioner or physician's assistant working with me, had a face-to-face encounter that meets the physician face-to-face encounter requirements with this patient on 06/17/2017. The encounter with the patient was in whole, or in part for the following medical condition(s) which is the primary reason for home health [name] (List medical condition): Status post left total knee arthroplasty   The encounter with the patient was in whole, or in part, for the following medical condition, which is the primary reason for home health [name]:  Status post left total knee arthroplasty.   I certify that, based on my findings, the following services are medically necessary home health services:  Physical therapy   Reason for Medically Necessary Home Health Services:   Therapy- Personnel officer, Public librarian Therapy- Therapeutic Exercises to Increase Strength and Endurance     My clinical findings support the need for the above services:   Unable to leave home safely without assistance and/or assistive device Pain interferes with ambulation/mobility     Further, I certify that my clinical findings support that this patient is homebound due to:   Ambulates short distances less than 300 feet Pain interferes with ambulation/mobility      Home Health   Complete by:  As directed    To provide the following [name]/treatments:  PT   Increase activity slowly as tolerated   Complete by:  As directed    Weight bearing as tolerated   Complete by:  As directed    Laterality:  left   Extremity:  Lower      Follow-up Information    Dorna Leitz, MD. Schedule an appointment as soon as possible for a visit in 2 weeks.   Specialty:  Orthopedic Surgery Contact information: [place] [address] [phone]            Signed: Erlene Senters 06/17/2017, 10:32 AM

## 2017-06-22 ENCOUNTER — Telehealth: Payer: Self-pay | Admitting: Endocrinology

## 2017-06-22 ENCOUNTER — Other Ambulatory Visit: Payer: Self-pay | Admitting: Endocrinology

## 2017-06-22 NOTE — Telephone Encounter (Signed)
Patient ask the nurse to call her. She did not want to tell me, she want to talk to the nurse. Please advise

## 2017-07-16 NOTE — Progress Notes (Signed)
[place]  [phone] [phone]     ID: Amanda [name] DOB: May 17, 1945  MR#: [id number]  MEQ#:[id number]  Patient Care Team: Antony Contras, MD as PCP - General (Family Medicine) Sylvan Cheese, NP as Nurse Practitioner (Hematology and Oncology) Clorene Nerio, Virgie Dad, MD as Consulting Physician (Oncology) Thea Silversmith, MD as Consulting Physician (Radiation Oncology) Alphonsa Overall, MD as Consulting Physician (General Surgery) [name], MD as Consulting Physician (Endocrinology) GYN: OTHER MD:  CHIEF COMPLAINT: estrogen receptor positive breast cancer  CURRENT TREATMENT: anastrozole   BREAST CANCER HISTORY:  from the original intake note:  Amanda [name] had routine screening mammography at [facility] 03/10/2015. The breast density was category B. There was a new cluster of nodules in the right breast at the 11:00 position measuring approximately 5 mm. Ultrasonography 03/15/2014 confirmed a 1.1 center meter oval mass in the right breast at the 10:00 position 6 cm from the nipple. There was a benign 0.8 cm simple cyst immediately adjacent to the mass. The right axilla was sonographically benign.  Biopsy of the right breast mass in question 03/22/2015 showed ([id number]) invasive ductal carcinoma, grade 1, estrogen receptor 95% positive, progesterone receptor 80% positive, both with strong staining intensity, with an MIB-1 of 15%, and no HER-2 amplification, the signals ratio being 1.58 and the number per cell 3.00.  Her subsequent history is as detailed below  INTERVAL HISTORY: Amanda [name] returns today for a follow-up of her estrogen receptor positive breast cancer.   The patient continues on anastrozole, which she tolerates well. She does have hot flashes but the Clonidone patch has been helping. She denies vaginal dryness. She is able to obtain at a reasonable price.    REVIEW OF SYSTEMS: Amanda [name] is doing well. She had her left knee replaced by Dr.  Berenice Primas. She has been going to physical therapy and has been doing well. The patient denies unusual headaches, visual changes, nausea, vomiting, or dizziness. There has been no unusual cough, phlegm production, or pleurisy. This been no change in bowel or bladder habits. The patient denies unexplained fatigue or unexplained weight loss, bleeding, rash, or fever. A detailed review of systems was otherwise noncontributory.    PAST MEDICAL HISTORY: Past Medical History:  Diagnosis Date  . Arthritis   . Breast cancer ([place])   . Breast cancer of upper-outer quadrant of right female breast ([place]) 03/24/2015  . Carpal tunnel syndrome   . Diabetes mellitus without complication ([place])    type 2  . GERD (gastroesophageal reflux disease)    Pt takes OTC when needed.  Marland Kitchen Hot flashes     PAST SURGICAL HISTORY: Past Surgical History:  Procedure Laterality Date  . ABDOMINAL HYSTERECTOMY    . APPENDECTOMY    . COLONOSCOPY    . ESOPHAGOGASTRODUODENOSCOPY    . EYE SURGERY Bilateral    Cataracts removed  . JOINT REPLACEMENT    . RADIOACTIVE SEED GUIDED PARTIAL MASTECTOMY WITH AXILLARY SENTINEL LYMPH NODE BIOPSY Right 04/16/2015   Procedure: RADIOACTIVE SEED GUIDED PARTIAL MASTECTOMY WITH AXILLARY SENTINEL LYMPH NODE BIOPSY;  Surgeon: Alphonsa Overall, MD;  Location: [facility];  Service: General;  Laterality: Right;  . SHOULDER SURGERY Right   . TONSILLECTOMY    . TOTAL KNEE ARTHROPLASTY Right 07/18/2013   DR GRAVES  . TOTAL KNEE ARTHROPLASTY Right 07/18/2013   Procedure: TOTAL KNEE ARTHROPLASTY;  Surgeon: Alta Corning, MD;  Location: [facility];  Service: Orthopedics;  Laterality: Right;  . TOTAL KNEE ARTHROPLASTY Left 06/15/2017  Procedure: LEFT TOTAL KNEE ARTHROPLASTY;  Surgeon: Dorna Leitz, MD;  Location: [facility];  Service: Orthopedics;  Laterality: Left;    FAMILY HISTORY No family history on file. The patient's father died in his 30s from reasons unclear to the patient. Her mother died  from heart disease at the age of 52. The patient had one brother, 5 sisters. There is no history of breast or ovarian cancer in the family to her knowledge.  GYNECOLOGIC HISTORY:  No LMP recorded. Patient has had a hysterectomy. Menarche age 18, first live birth age 62. She is GX P2. She underwent hysterectomy with bilateral salpingo-oophorectomy in her 30s. 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 She is divorced and lives alone, with no pets. Son Amanda [name] lives in [place] where he manages an auto zone. Daughter Amanda [name] lives in [place]. She works for [employer]. The patient has 4 grandchildren. She is a Psychologist, forensic.    ADVANCED DIRECTIVES: Not in place   HEALTH MAINTENANCE: Social History   Tobacco Use  . Smoking status: Never Smoker  . Smokeless tobacco: Never Used  Substance Use Topics  . Alcohol use: Yes    Comment: socially, occ weekends  . Drug use: No     Col[facility]oscopy: 2016[facility]  PAP: Status post hysterectomy  Bone density: 2016/ normal/ [facility]  Lipid panel:  Allergies  Allergen Reactions  . Statins Other (See Comments)    Muscle pain. Pt able to take Crestor but not everyday.  . Meperidine Hcl Rash    Current Outpatient Medications  Medication Sig Dispense Refill  . anastrozole (ARIMIDEX) 1 MG tablet Take 1 tablet (1 mg total) by mouth daily. 30 tablet 2  . aspirin EC 325 MG tablet Take 1 tablet (325 mg total) by mouth 2 (two) times daily after a meal. Take x 1 month post op to decrease risk of blood clots. 60 tablet 0  . canagliflozin (INVOKANA) 300 MG TABS tablet Take 300 mg by mouth daily before the first meal of the day 30 tablet 0  . Cholecalciferol (VITAMIN D3) 2000 units TABS Take 6,000 Units by mouth daily.     . cloNIDine (CATAPRES - DOSED IN MG/24 HR) 0.1 mg/24hr patch Place 1 patch (0.1 mg total) onto the skin once a week. 4 patch 12  . docusate  sodium (COLACE) 100 MG capsule Take 1 capsule (100 mg total) by mouth 2 (two) times daily. 30 capsule 0  . Dulaglutide (TRULICITY) 1.96 QI/2.9NL SOPN INJECT 0.75 MG UNDER THE SKIN ONCE A WEEK (Patient taking differently: Inject 0.75 mg into the skin once a week. ) 4 pen 1  . olopatadine (PATANOL) 0.1 % ophthalmic solution Instill 1 drop in left eye twice daily  11  . ONE TOUCH ULTRA TEST test strip USE TO TEST BLOOD SUGAR ONE TIME DAILY AS INSTRUCTED 100 each 1  . oxyCODONE-acetaminophen (PERCOCET/ROXICET) 5-325 MG tablet Take 1-2 tablets by mouth every 6 (six) hours as needed for severe pain. 60 tablet 0  . Pitavastatin Calcium (LIVALO) 4 MG TABS Take one tablet daily (Patient taking differently: Take 4 mg by mouth daily. ) 30 tablet 3  . tiZANidine (ZANAFLEX) 2 MG tablet Take 1 tablet (2 mg total) by mouth every 8 (eight) hours as needed for muscle spasms. 50 tablet 0   No current facility-administered medications for this visit.     OBJECTIVE: Middle-aged African-American woman who appears  well Vitals:   07/17/17 0858  BP: 121/79  Pulse: 73  Resp: 18  Temp: 98 F (36.7 C)  SpO2: 100%     Body mass index is 30.44 kg/m.    ECOG FS:1 - Symptomatic but completely ambulatory  Sclerae unicteric, pupils round and equal No cervical or supraclavicular adenopathy Lungs no rales or rhonchi Heart regular rate and rhythm Abd soft, nontender, positive bowel sounds MSK no focal spinal tenderness, no upper extremity lymphedema, left knee wrapped Neuro: nonfocal, well oriented, appropriate affect Breasts: The right breast is status post lumpectomy and radiation.  There is the expected skin thickening, but no evidence of local recurrence.  The left breast is benign.  Both axillae are benign.  LAB RESULTS:  CMP     Component Value Date/Time   NA 140 07/17/2017 0821   NA 144 05/09/2016 1108   K 4.6 07/17/2017 0821   K 4.9 05/09/2016 1108   CL 103 07/17/2017 0821   CO2 28 07/17/2017 0821    CO2 27 05/09/2016 1108   GLUCOSE 101 07/17/2017 0821   GLUCOSE 124 05/09/2016 1108   BUN 24 07/17/2017 0821   BUN 21.0 05/09/2016 1108   CREATININE 0.93 07/17/2017 0821   CREATININE 1.0 05/09/2016 1108   CALCIUM 10.7 (H) 07/17/2017 0821   CALCIUM 10.4 05/09/2016 1108   PROT 7.6 07/17/2017 0821   PROT 7.1 05/09/2016 1108   ALBUMIN 3.9 07/17/2017 0821   ALBUMIN 3.6 05/09/2016 1108   AST 13 07/17/2017 0821   AST 18 05/09/2016 1108   ALT 11 07/17/2017 0821   ALT 20 05/09/2016 1108   ALKPHOS 80 07/17/2017 0821   ALKPHOS 78 05/09/2016 1108   BILITOT 0.3 07/17/2017 0821   BILITOT 0.61 05/09/2016 1108   GFRNONAA 60 (L) 07/17/2017 0821   GFRAA >60 07/17/2017 0821    INo results found for: SPEP, UPEP  Lab Results  Component Value Date   WBC 6.0 07/17/2017   NEUTROABS 2.7 07/17/2017   HGB 12.8 07/17/2017   HCT 40.0 07/17/2017   MCV 96.9 07/17/2017   PLT 376 07/17/2017      Chemistry      Component Value Date/Time   NA 140 07/17/2017 0821   NA 144 05/09/2016 1108   K 4.6 07/17/2017 0821   K 4.9 05/09/2016 1108   CL 103 07/17/2017 0821   CO2 28 07/17/2017 0821   CO2 27 05/09/2016 1108   BUN 24 07/17/2017 0821   BUN 21.0 05/09/2016 1108   CREATININE 0.93 07/17/2017 0821   CREATININE 1.0 05/09/2016 1108      Component Value Date/Time   CALCIUM 10.7 (H) 07/17/2017 0821   CALCIUM 10.4 05/09/2016 1108   ALKPHOS 80 07/17/2017 0821   ALKPHOS 78 05/09/2016 1108   AST 13 07/17/2017 0821   AST 18 05/09/2016 1108   ALT 11 07/17/2017 0821   ALT 20 05/09/2016 1108   BILITOT 0.3 07/17/2017 0821   BILITOT 0.61 05/09/2016 1108       No results found for: LABCA2  No components found for: LABCA125  No results for input(s): INR in the last 168 hours.  Urinalysis    Component Value Date/Time   COLORURINE STRAW (A) 06/07/2017 0838   APPEARANCEUR CLEAR 06/07/2017 0838   LABSPEC 1.015 06/07/2017 0838   PHURINE 5.0 06/07/2017 0838   GLUCOSEU >=500 (A) 06/07/2017 0838    GLUCOSEU NEGATIVE 04/29/2015 1010   HGBUR NEGATIVE 06/07/2017 0838   BILIRUBINUR NEGATIVE 06/07/2017 0838   [place] 06/07/2017  [place] 06/07/2017 0838   UROBILINOGEN 0.2 04/29/2015 1010   NITRITE NEGATIVE 06/07/2017 0838   LEUKOCYTESUR NEGATIVE 06/07/2017 0838      ELIGIBLE FOR AVAILABLE RESEARCH PROTOCOL: no  STUDIES: Repeat bone density pending this year  ASSESSMENT: 72 y.o. [place] woman status post right breast upper outer quadrant biopsy 03/22/2015 for a clinical T1CN0, stage IA invasive ductal carcinoma, grade 1, estrogen and progesterone receptor positive, HER-2 not amplified, with an MIB-1 of 15%.  (1)  Status post right lumpectomy and sentinel lymph node sampling 04/16/2015 for a pT1c pN0, stage IA  Invasive ductal carcinoma, grade 1, repeat HER-2 again negative  (2) Oncotype DX  Score of 12 predicts a risk of outside the breast recurrence of 8% within 10 years if the patient's only systemic therapy is tamoxifen for 5 years. It also predicts no benefit from chemotherapy.  (3) adjuvant radiation completed 07/19/2015   (4) started anastrozole 10/08/2015, interrupted because of a rash, restarted 11/29/2015  (a) DEXA scan 05/28/2014 was normal with a T score of +0.7  PLAN: Amanda [name] is now 2 years out from definitive surgery for her breast cancer with no evidence of disease recurrence.  This is very favorable.  She is tolerating anastrozole well and the plan will be to continue that a total of 5 years.  She was supposed to have had a bone density prior to today's visit but because of her knee surgery this was postponed.  She will get it done sometime this summer.  Depending on results we will consider switching to tamoxifen or adding a bisphosphonate or simply continuing as we are doing  She will return to see me in 1 year.  She knows to call for any problems that may develop before that visit.  Amanda [name], Virgie Dad, MD  07/17/17 9:21 AM Medical  Oncology and Hematology [facility] [address] [place], [place] [address] Tel. [phone]    Fax. [phone]   I, Margit Banda am acting as a scribe for Chauncey Cruel, MD.   I, Lurline Del MD, have reviewed the above documentation for accuracy and completeness, and I agree with the above.

## 2017-07-17 ENCOUNTER — Inpatient Hospital Stay: Payer: Medicare Other | Attending: Oncology

## 2017-07-17 ENCOUNTER — Inpatient Hospital Stay (HOSPITAL_BASED_OUTPATIENT_CLINIC_OR_DEPARTMENT_OTHER): Payer: Medicare Other | Admitting: Oncology

## 2017-07-17 VITALS — BP 121/79 | HR 73 | Temp 98.0°F | Resp 18 | Ht 62.5 in | Wt 169.1 lb

## 2017-07-17 DIAGNOSIS — N951 Menopausal and female climacteric states: Secondary | ICD-10-CM | POA: Diagnosis not present

## 2017-07-17 DIAGNOSIS — C50412 Malignant neoplasm of upper-outer quadrant of left female breast: Secondary | ICD-10-CM | POA: Insufficient documentation

## 2017-07-17 DIAGNOSIS — Z79899 Other long term (current) drug therapy: Secondary | ICD-10-CM | POA: Diagnosis not present

## 2017-07-17 DIAGNOSIS — Z170 Estrogen receptor positive status [ER+]: Secondary | ICD-10-CM | POA: Diagnosis not present

## 2017-07-17 DIAGNOSIS — C50411 Malignant neoplasm of upper-outer quadrant of right female breast: Secondary | ICD-10-CM

## 2017-07-17 DIAGNOSIS — Z923 Personal history of irradiation: Secondary | ICD-10-CM | POA: Insufficient documentation

## 2017-07-17 DIAGNOSIS — E119 Type 2 diabetes mellitus without complications: Secondary | ICD-10-CM

## 2017-07-17 LAB — CBC WITH DIFFERENTIAL/PLATELET
Basophils Absolute: 0 10*3/uL (ref 0.0–0.1)
Basophils Relative: 1 %
EOS PCT: 4 %
Eosinophils Absolute: 0.2 10*3/uL (ref 0.0–0.5)
HEMATOCRIT: 40 % (ref 34.8–46.6)
Hemoglobin: 12.8 g/dL (ref 11.6–15.9)
LYMPHS ABS: 2.6 10*3/uL (ref 0.9–3.3)
LYMPHS PCT: 43 %
MCH: 31 pg (ref 25.1–34.0)
MCHC: 32 g/dL (ref 31.5–36.0)
MCV: 96.9 fL (ref 79.5–101.0)
MONO ABS: 0.5 10*3/uL (ref 0.1–0.9)
Monocytes Relative: 8 %
Neutro Abs: 2.7 10*3/uL (ref 1.5–6.5)
Neutrophils Relative %: 44 %
PLATELETS: 376 10*3/uL (ref 145–400)
RBC: 4.13 MIL/uL (ref 3.70–5.45)
RDW: 14.4 % (ref 11.2–14.5)
WBC: 6 10*3/uL (ref 3.9–10.3)

## 2017-07-17 LAB — COMPREHENSIVE METABOLIC PANEL
ALT: 11 U/L (ref 0–55)
AST: 13 U/L (ref 5–34)
Albumin: 3.9 g/dL (ref 3.5–5.0)
Alkaline Phosphatase: 80 U/L (ref 40–150)
Anion gap: 9 (ref 3–11)
BILIRUBIN TOTAL: 0.3 mg/dL (ref 0.2–1.2)
BUN: 24 mg/dL (ref 7–26)
CALCIUM: 10.7 mg/dL — AB (ref 8.4–10.4)
CHLORIDE: 103 mmol/L (ref 98–109)
CO2: 28 mmol/L (ref 22–29)
CREATININE: 0.93 mg/dL (ref 0.60–1.10)
GFR, EST NON AFRICAN AMERICAN: 60 mL/min — AB (ref >60)
Glucose, Bld: 101 mg/dL (ref 70–140)
Potassium: 4.6 mmol/L (ref 3.5–5.1)
Sodium: 140 mmol/L (ref 136–145)
TOTAL PROTEIN: 7.6 g/dL (ref 6.4–8.3)

## 2017-07-25 ENCOUNTER — Other Ambulatory Visit: Payer: Self-pay | Admitting: Endocrinology

## 2017-08-28 ENCOUNTER — Other Ambulatory Visit: Payer: Self-pay | Admitting: Endocrinology

## 2017-08-28 ENCOUNTER — Other Ambulatory Visit: Payer: Self-pay

## 2017-08-28 MED ORDER — CANAGLIFLOZIN 300 MG PO TABS: ORAL_TABLET | ORAL | 0 refills | Status: DC

## 2017-09-14 ENCOUNTER — Other Ambulatory Visit (INDEPENDENT_AMBULATORY_CARE_PROVIDER_SITE_OTHER): Payer: Medicare Other

## 2017-09-14 DIAGNOSIS — E1165 Type 2 diabetes mellitus with hyperglycemia: Secondary | ICD-10-CM

## 2017-09-14 LAB — HEMOGLOBIN A1C: Hgb A1c MFr Bld: 6.4 % (ref 4.6–6.5)

## 2017-09-14 LAB — BASIC METABOLIC PANEL
BUN: 23 mg/dL (ref 6–23)
CHLORIDE: 103 meq/L (ref 96–112)
CO2: 27 meq/L (ref 19–32)
Calcium: 10.5 mg/dL (ref 8.4–10.5)
Creatinine, Ser: 1.06 mg/dL (ref 0.40–1.20)
GFR: 65.49 mL/min (ref >60.00)
Glucose, Bld: 115 mg/dL — ABNORMAL HIGH (ref 70–99)
Potassium: 5 mEq/L (ref 3.5–5.1)
SODIUM: 138 meq/L (ref 135–145)

## 2017-09-17 ENCOUNTER — Encounter: Payer: Self-pay | Admitting: Endocrinology

## 2017-09-17 ENCOUNTER — Ambulatory Visit (INDEPENDENT_AMBULATORY_CARE_PROVIDER_SITE_OTHER): Payer: Medicare Other | Admitting: Endocrinology

## 2017-09-17 VITALS — BP 140/70 | HR 69 | Ht 62.5 in | Wt 170.6 lb

## 2017-09-17 DIAGNOSIS — E119 Type 2 diabetes mellitus without complications: Secondary | ICD-10-CM | POA: Diagnosis not present

## 2017-09-17 DIAGNOSIS — E7800 Pure hypercholesterolemia, unspecified: Secondary | ICD-10-CM | POA: Diagnosis not present

## 2017-09-17 MED ORDER — PITAVASTATIN CALCIUM 4 MG PO TABS: ORAL_TABLET | ORAL | 3 refills | Status: DC

## 2017-09-17 NOTE — Progress Notes (Signed)
Amanda [name] is an 72 y.o. female.             Reason for Appointment: Diabetes follow-up   Initial diagnosis of diabetes:?  2007   History of Present Illness   Diagnosis: Type 2 DIABETES MELITUS     Previous history: She has had difficulty with blood sugar control initially because of limited tolerability with metformin and difficulty with weight loss. She did very well with starting Victoza which has helped her blood sugar control as well as some improvement in her obesity Her insurance company did not prefer Victoza and she was switched to Trulicity Subsequently her glipizide was stopped She was started on Invokana 100 mg daily in addition to her Trulicity 6.96 mg when her blood sugars were higher in 2017  Recent history:    Her A1c is improved at 6.4 compared to 6.8 previously    Current blood sugar patterns, management and problems identified:   She was last seen in 05/2017  Has lost a couple of pounds weight since her last visit and did have knee surgery in May  However she is checking her blood sugars very sporadically and has only 5 readings in the last month  Her blood sugars are excellent however and range from 87 up to 117  She does not check her sugars after her main meal in the evening  She does think that she is watching her portions at meals  No side effects from Trulicity again, may have only mild transient nausea at times  Lab glucose was 115     hypoglycemic drugs:   Invokana 295 mg daily  Trulicity 2.84 mg weekly      Side effects from medications: Diarrhea from metformin.  Nausea from 1.5 mg Trulicity         Monitors blood glucose:  less than Once a day.    Glucometer: One Touch ultra 2  Readings as above, median 103  Physical activity: exercise: walking on treadmill 3-4 days upto 30 min            Wt Readings from Last 3 Encounters:  09/17/17 170 lb 9.6 oz (77.4 kg)  07/17/17 169 lb 1.6 oz (76.7 kg)  06/15/17 174 lb (78.9 kg)     LABS:  Lab Results  Component Value Date   HGBA1C 6.4 09/14/2017   HGBA1C 6.8 (H) 05/16/2017   HGBA1C 6.5 12/21/2016   Lab Results  Component Value Date   MICROALBUR <0.7 05/16/2017   LDLCALC 139 (H) 05/16/2017   CREATININE 1.06 09/14/2017     Lab on 09/14/2017  Component Date Value Ref Range Status  . Sodium 09/14/2017 138  135 - 145 mEq/L Final  . Potassium 09/14/2017 5.0  3.5 - 5.1 mEq/L Final  . Chloride 09/14/2017 103  96 - 112 mEq/L Final  . CO2 09/14/2017 27  19 - 32 mEq/L Final  . Glucose, Bld 09/14/2017 115* 70 - 99 mg/dL Final  . BUN 09/14/2017 23  6 - 23 mg/dL Final  . Creatinine, Ser 09/14/2017 1.06  0.40 - 1.20 mg/dL Final  . Calcium 09/14/2017 10.5  8.4 - 10.5 mg/dL Final  . GFR 09/14/2017 65.49  >60.00 mL/min Final  . Hgb A1c MFr Bld 09/14/2017 6.4  4.6 - 6.5 % Final   Glycemic Control Guidelines for People with Diabetes:Non Diabetic:  <6%Goal of Therapy: <7%Additional Action Suggested:  >8%     Allergies as of 09/17/2017      Reactions   Statins Other (  See Comments)   Muscle pain. Pt able to take Crestor but not everyday.   Meperidine Hcl Rash      Medication List        Accurate as of 09/17/17  9:22 AM. Always use your most recent med list.          anastrozole 1 MG tablet Commonly known as:  ARIMIDEX Take 1 tablet (1 mg total) by mouth daily.   aspirin EC 325 MG tablet Take 1 tablet (325 mg total) by mouth 2 (two) times daily after a meal. Take x 1 month post op to decrease risk of blood clots.   canagliflozin 300 MG Tabs tablet Commonly known as:  INVOKANA TAKE 1 TABLET BY MOUTH DAILY BEFORE THE FIRST MEAL OF THE DAY   cloNIDine 0.1 mg/24hr patch Commonly known as:  CATAPRES - Dosed in mg/24 hr Place 1 patch (0.1 mg total) onto the skin once a week.   Dulaglutide 0.75 MG/0.5ML Sopn INJECT 0.75 MG UNDER THE SKIN ONCE A WEEK   olopatadine 0.1 % ophthalmic solution Commonly known as:  PATANOL Instill 1 drop in left eye twice  daily   ONE TOUCH ULTRA TEST test strip Generic drug:  glucose blood USE TO TEST BLOOD SUGAR ONE TIME DAILY AS INSTRUCTED   Pitavastatin Calcium 4 MG Tabs Take one tablet daily   Vitamin D3 2000 units Tabs Take 6,000 Units by mouth daily.       Allergies:  Allergies  Allergen Reactions  . Statins Other (See Comments)    Muscle pain. Pt able to take Crestor but not everyday.  . Meperidine Hcl Rash    Past Medical History:  Diagnosis Date  . Arthritis   . Breast cancer ([place])   . Breast cancer of upper-outer quadrant of right female breast ([place]) 03/24/2015  . Carpal tunnel syndrome   . Diabetes mellitus without complication ([place])    type 2  . GERD (gastroesophageal reflux disease)    Pt takes OTC when needed.  Marland Kitchen Hot flashes     Past Surgical History:  Procedure Laterality Date  . ABDOMINAL HYSTERECTOMY    . APPENDECTOMY    . COLONOSCOPY    . ESOPHAGOGASTRODUODENOSCOPY    . EYE SURGERY Bilateral    Cataracts removed  . JOINT REPLACEMENT    . RADIOACTIVE SEED GUIDED PARTIAL MASTECTOMY WITH AXILLARY SENTINEL LYMPH NODE BIOPSY Right 04/16/2015   Procedure: RADIOACTIVE SEED GUIDED PARTIAL MASTECTOMY WITH AXILLARY SENTINEL LYMPH NODE BIOPSY;  Surgeon: Alphonsa Overall, MD;  Location: [facility];  Service: General;  Laterality: Right;  . SHOULDER SURGERY Right   . TONSILLECTOMY    . TOTAL KNEE ARTHROPLASTY Right 07/18/2013   DR GRAVES  . TOTAL KNEE ARTHROPLASTY Right 07/18/2013   Procedure: TOTAL KNEE ARTHROPLASTY;  Surgeon: Alta Corning, MD;  Location: [facility] [facility];  Service: Orthopedics;  Laterality: Right;  . TOTAL KNEE ARTHROPLASTY Left 06/15/2017   Procedure: LEFT TOTAL KNEE ARTHROPLASTY;  Surgeon: Dorna Leitz, MD;  Location: [facility];  Service: Orthopedics;  Laterality: Left;    History reviewed. No pertinent family history.  Social History:  reports that she has never smoked. She has never used smokeless tobacco. She reports that she drinks alcohol. She reports  that she does not use drugs.  Review of Systems:  Lipids:  She was switched from Crestor to Livalo because of her difficulty tolerating this because of joint pains . Now taking Livalo 4 mg, about 4 days a week since the last  couple of months, previously taking it irregularly  Previously baseline LDL has been as high as 200 LDL is relatively higher in 4/19     Lab Results  Component Value Date   CHOL 216 (H) 05/16/2017   HDL 66.60 05/16/2017   LDLCALC 139 (H) 05/16/2017   LDLDIRECT 120.1 09/30/2013   TRIG 53.0 05/16/2017   CHOLHDL 3 05/16/2017    She takes Catapres patch prescribed by her oncologist mostly for hot flashes and not for hypertension  Foot exam done in 8/19, normal exam  Eye exam 10/18, no abnormalities found and she will have report faxed to [place]    Examination:     BP 140/70 (BP Location: Left Arm, Patient Position: Sitting, Cuff Size: Normal)   Pulse 69   Ht 5' 2.5" (1.588 m)   Wt 170 lb 9.6 oz (77.4 kg)   SpO2 97%   BMI 30.71 kg/m   Body mass index is 30.71 kg/m.      ASSESSMENT/ PLAN:  Diabetes type 2  See history of present illness for discussion of current diabetes management, blood sugar patterns and problems identified  Her blood sugars are well controlled on her regimen of Invokana and Trulicity 5.18 mg weekly Blood sugars at home are mostly near normal but she is checking only a few readings in the mornings  Her A1c is excellent at 6.4  She has started walking back since her knee surgery and is still able to keep her weight down, recent BMI under 31 She will stay on the same regimen but reminded her to check her sugars after evening meal periodically Follow-up in 4 months  HYPERCHOLESTEROLEMIA:  Baseline LDL 200 She is her Livalo more consistently now Will recheck lipids on the next visit    There are no Patient Instructions on file for this visit.   [name] 09/17/2017, 9:22 AM

## 2017-09-17 NOTE — Patient Instructions (Signed)
Check blood sugars on waking up  2/7  Also check blood sugars about 2 hours after a meal and do this after different meals by rotation  Recommended blood sugar levels on waking up is 90-130 and about 2 hours after meal is 130-160  Please bring your blood sugar monitor to each visit, thank you  

## 2017-09-24 ENCOUNTER — Telehealth: Payer: Self-pay | Admitting: Endocrinology

## 2017-09-24 NOTE — Telephone Encounter (Signed)
Dulaglutide (TRULICITY) 1.44 HQ/0.1MD SOPN   Patient came into the office today with a [facility] from [facility] They need Dr Dwyane Dee to [facility] a [facility] before 10/07/2017   [facility] [place], [place]

## 2017-09-25 ENCOUNTER — Other Ambulatory Visit: Payer: Self-pay

## 2017-09-25 MED ORDER — DULAGLUTIDE 0.75 MG/0.5ML ~~LOC~~ SOAJ: SUBCUTANEOUS | 1 refills | Status: DC

## 2017-09-25 NOTE — Telephone Encounter (Signed)
I have sent to [facility] for patient.

## 2017-11-23 ENCOUNTER — Other Ambulatory Visit: Payer: Self-pay

## 2017-11-23 ENCOUNTER — Telehealth: Payer: Self-pay | Admitting: Family Medicine

## 2017-11-23 MED ORDER — PITAVASTATIN CALCIUM 4 MG PO TABS: ORAL_TABLET | ORAL | 3 refills | Status: DC

## 2017-11-23 NOTE — Telephone Encounter (Signed)
Best number [phone] Pt called checking on her refill  livalo She stated [facility] has sent refill request  Pt has 1 more left

## 2017-11-23 NOTE — Telephone Encounter (Signed)
This has been sent

## 2017-11-28 ENCOUNTER — Telehealth: Payer: Self-pay

## 2017-11-28 NOTE — Telephone Encounter (Signed)
Patient calling today to get refill for livalo- [facility] stated they will not fill until MD calls because the insurance wants her to try something cheaper -patient stated contact number for [facility] is [phone]

## 2017-11-28 NOTE — Telephone Encounter (Signed)
LMTCB to let her know the update

## 2017-11-28 NOTE — Telephone Encounter (Signed)
Spoke with [place] with [facility] she states there was a PA that was placed yesterday but was denied.  Sent to PA line [phone] spoke with Elmyra Ricks, transferred to the [facility] line within [facility] Nira Conn - sending out denial information now it was denied due to information needing to be completed whether the patient has already taken other statin meds. Full list of these meds are being sent out with in the denial.

## 2017-11-28 NOTE — Telephone Encounter (Signed)
Let the pt know we are awaiting the appeal paperwork and will let her know if the appeal is needed and/or approved

## 2017-12-04 ENCOUNTER — Telehealth: Payer: Self-pay

## 2017-12-04 ENCOUNTER — Other Ambulatory Visit: Payer: Self-pay

## 2017-12-04 MED ORDER — PITAVASTATIN CALCIUM 4 MG PO TABS: ORAL_TABLET | ORAL | 0 refills | Status: DC

## 2017-12-04 NOTE — Telephone Encounter (Signed)
Patient called today to discuss status of Livalo prescription- I informed her that appeal was started and patient stated [facility] told her today that appeal was approved- I ordered 30 day supply to go to preferred pharmacy and instructed patient to call pharmacy before picking it up just to make sure since we have not received approval yet- patient agreed

## 2017-12-04 NOTE — Telephone Encounter (Signed)
LVM--paper work still waiting for appeal/approval and will keep you update.

## 2017-12-06 ENCOUNTER — Telehealth: Payer: Self-pay | Admitting: Endocrinology

## 2017-12-06 ENCOUNTER — Other Ambulatory Visit: Payer: Self-pay

## 2017-12-06 MED ORDER — DULAGLUTIDE 0.75 MG/0.5ML ~~LOC~~ SOAJ: SUBCUTANEOUS | 3 refills | Status: DC

## 2017-12-06 NOTE — Telephone Encounter (Signed)
error 

## 2017-12-24 ENCOUNTER — Other Ambulatory Visit: Payer: Self-pay | Admitting: Oncology

## 2018-01-14 ENCOUNTER — Other Ambulatory Visit: Payer: Self-pay

## 2018-01-14 ENCOUNTER — Telehealth: Payer: Self-pay | Admitting: Endocrinology

## 2018-01-14 ENCOUNTER — Other Ambulatory Visit: Payer: Medicare Other

## 2018-01-14 MED ORDER — ONETOUCH ULTRA 2 W/DEVICE KIT: 1.0000 | PACK | Freq: Every day | 0 refills | Status: AC

## 2018-01-14 NOTE — Telephone Encounter (Signed)
Meter sent in to pharmacy.

## 2018-01-14 NOTE — Telephone Encounter (Signed)
MEDICATION: ONE TOUCH ULTRA TEST test strip  PHARMACY:    IS THIS A 90 DAY SUPPLY :   IS PATIENT OUT OF MEDICATION:   IF NOT; HOW MUCH IS LEFT: Has missed place.  LAST APPOINTMENT DATE: @10 /31/2019  NEXT APPOINTMENT DATE:@12 /10/2017  DO WE HAVE YOUR PERMISSION TO LEAVE A DETAILED MESSAGE: Yes  OTHER COMMENTS:  Has missed place.  **Let patient know to contact pharmacy at the end of the day to make sure medication is ready. **  ** Please notify patient to allow 48-72 hours to process**  **Encourage patient to contact the pharmacy for refills or they can request refills through [facility]**

## 2018-01-14 NOTE — Telephone Encounter (Signed)
Medication copied in error. Please refill the One Touch meter.

## 2018-01-15 ENCOUNTER — Other Ambulatory Visit: Payer: Self-pay | Admitting: Endocrinology

## 2018-01-16 ENCOUNTER — Ambulatory Visit: Payer: Medicare Other | Admitting: Endocrinology

## 2018-01-24 ENCOUNTER — Other Ambulatory Visit: Payer: Self-pay | Admitting: Oncology

## 2018-02-01 IMAGING — CT CT ABD-PELV W/ CM
2 of 5 series · 16 of 46 positions shown, 18 images · IV contrast (iopamidol)
Comparison: None.

CLINICAL DATA: Initial evaluation for acute abdominal pain, nausea,
vomiting.

EXAM:
CT ABDOMEN AND PELVIS WITH CONTRAST
TECHNIQUE: Multidetector CT imaging of the abdomen and pelvis was performed
using the standard protocol following bolus administration of
intravenous contrast.
CONTRAST:  100mL [id number] IOPAMIDOL ([id number]) INJECTION 61%

[Series 2: abd/pel with · axial · 0.74mm/px · z∈[-410,-60]mm · 13 of 82 slices shown, 15 images]
[im 6/82  soft-tissue]
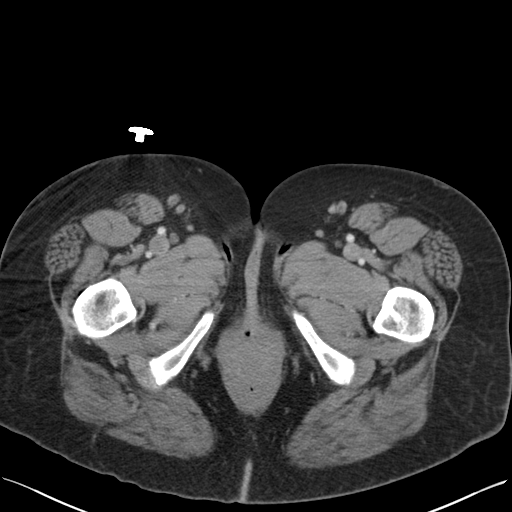
[im 6/82  bone]
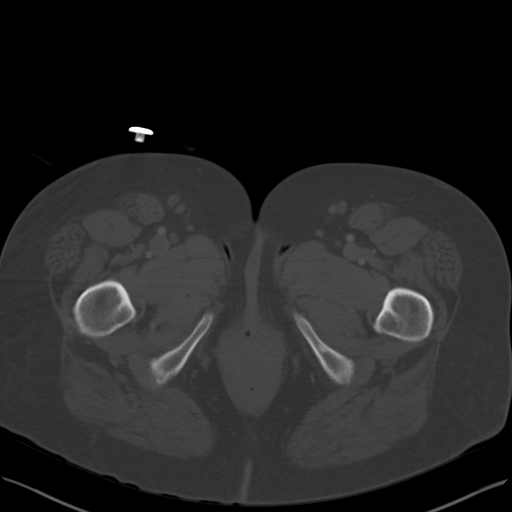
[im 12/82  soft-tissue]
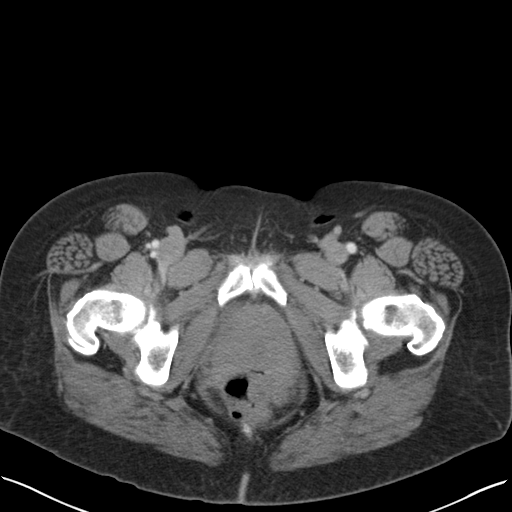
[im 18/82  soft-tissue]
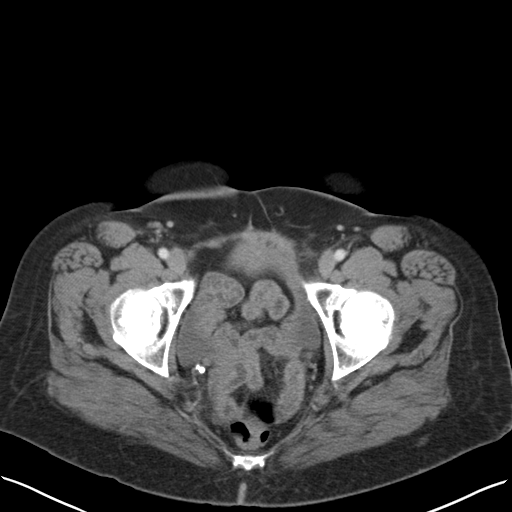
[im 24/82  soft-tissue]
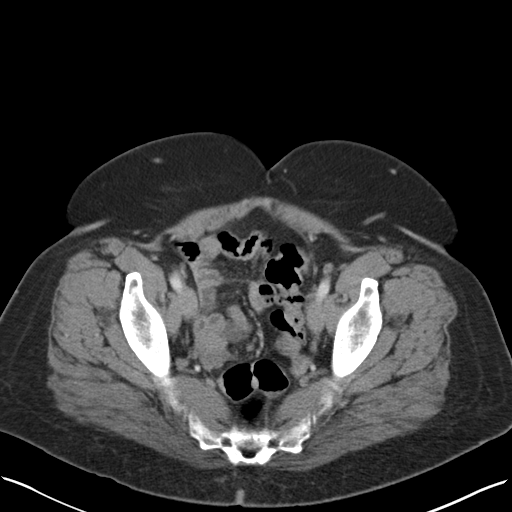
[im 29/82  soft-tissue]
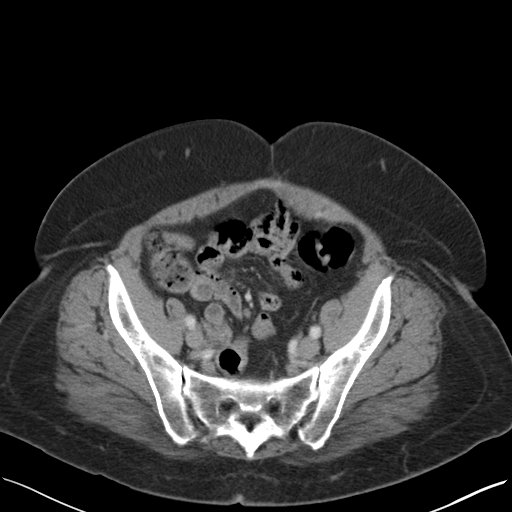
[im 35/82  soft-tissue]
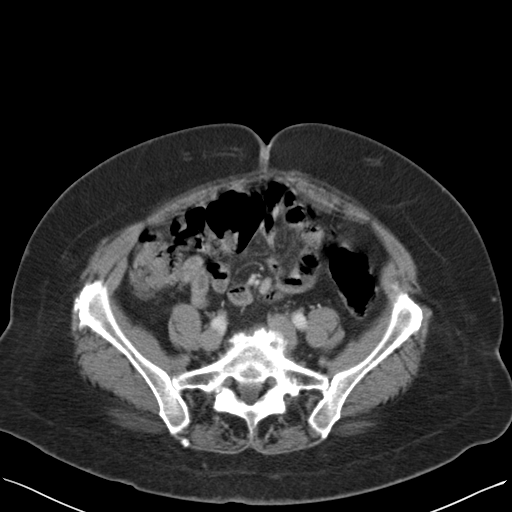
[im 41/82  soft-tissue]
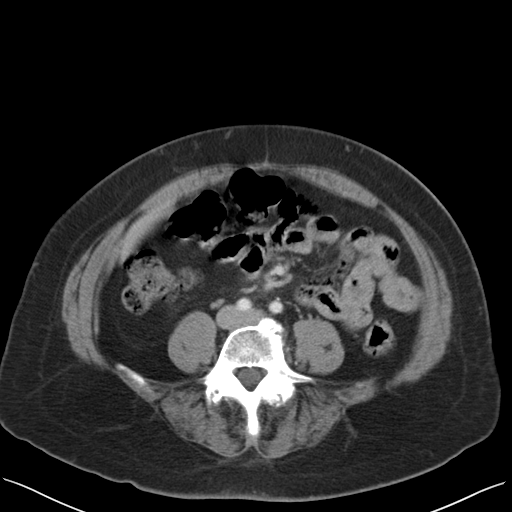
[im 47/82  soft-tissue]
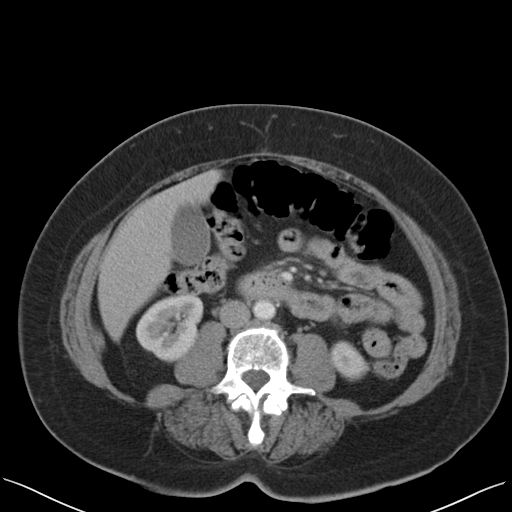
[im 53/82  soft-tissue]
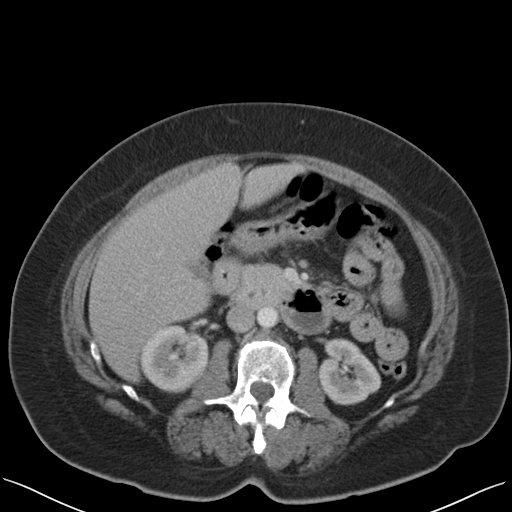
[im 53/82  bone]
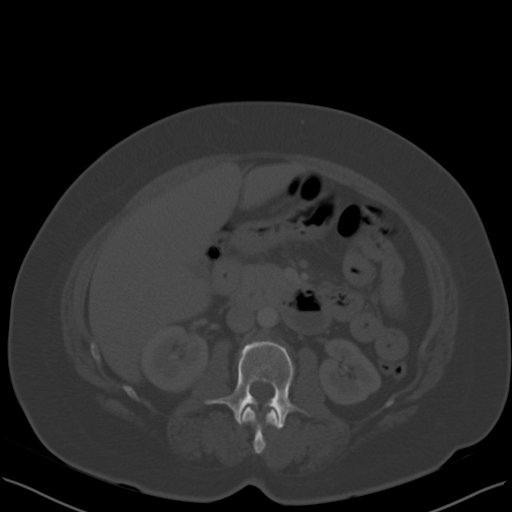
[im 58/82  soft-tissue]
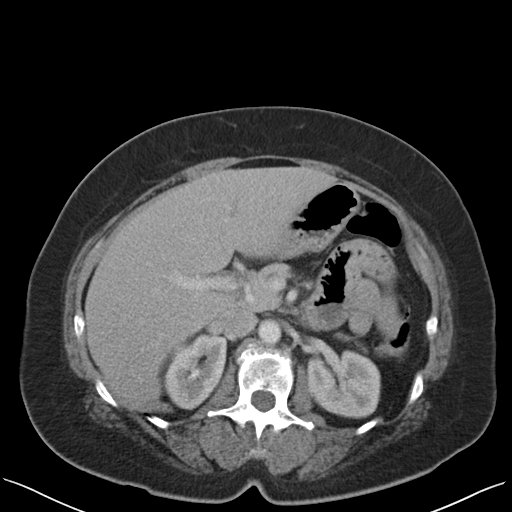
[im 64/82  soft-tissue]
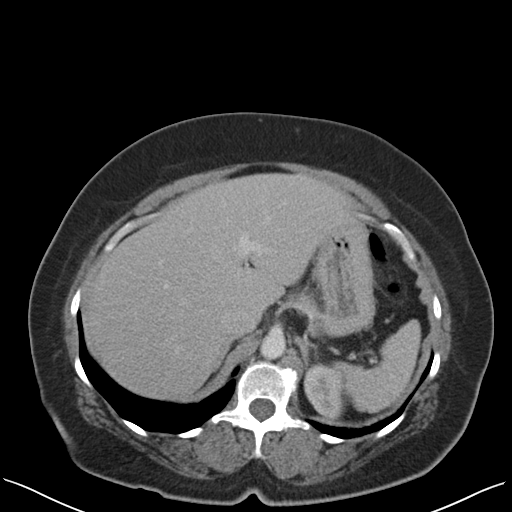
[im 70/82  soft-tissue]
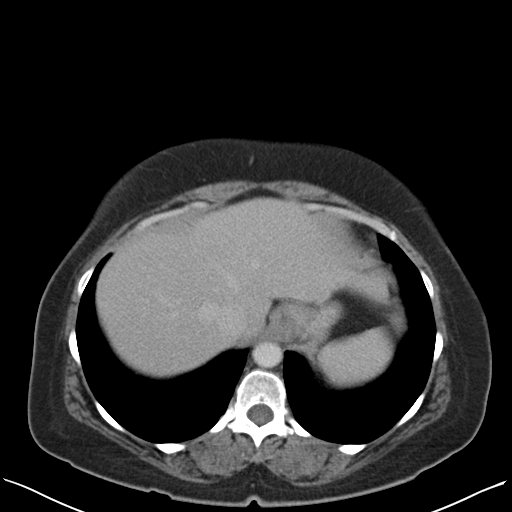
[im 76/82  soft-tissue]
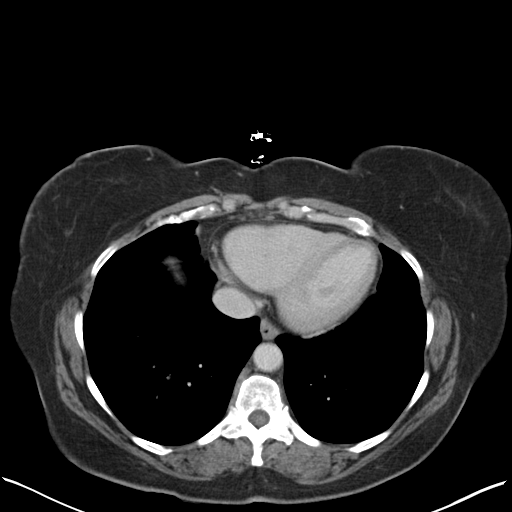

[Series 5: coronal a/|p · coronal · 0.84mm/px · 3 of 156 slices shown]
[im 52/156  soft-tissue]
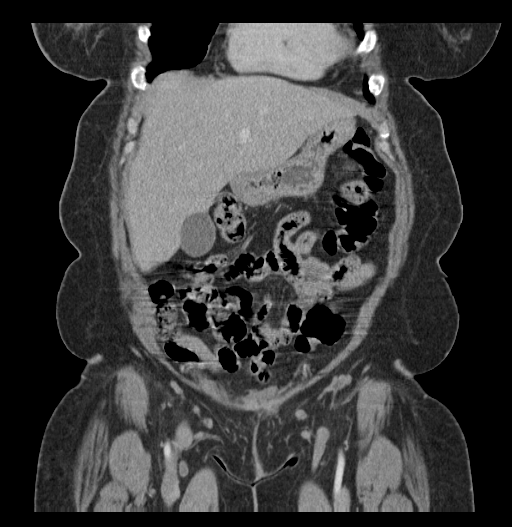
[im 69/156  soft-tissue]
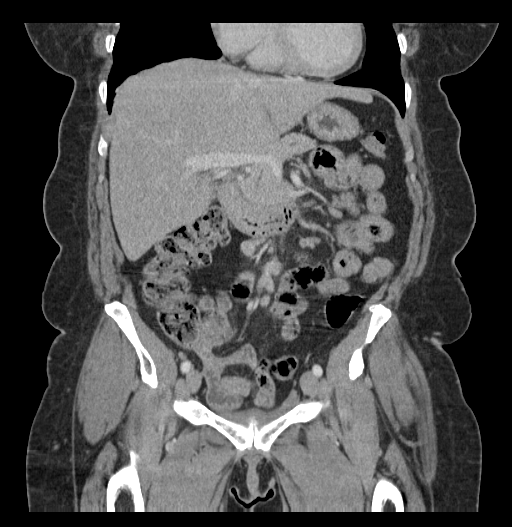
[im 87/156  soft-tissue]
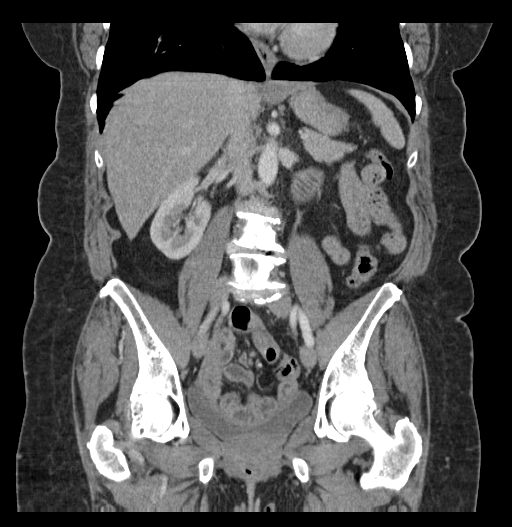

[16 of 46 positions shown; findings below may reference images not displayed]

FINDINGS: Visualized lung bases are clear. No pleural or pericardial effusion.

Liver demonstrates a normal contrast enhanced appearance.
Gallbladder normal. No biliary dilatation. Spleen, adrenal glands,
and pancreas demonstrate a normal contrast enhanced appearance.

Kidneys are equal in size with symmetric enhancement. No
nephrolithiasis, hydronephrosis, or focal enhancing renal mass.

Stomach grossly unremarkable, although evaluation somewhat limited
by of motion artifact. No evidence for bowel obstruction. No
abnormal wall thickening, mucosal enhancement, or inflammatory fat
stranding seen about the bowels. Appendix not visualize, consistent
with history prior appendectomy.

Bladder decompressed without definite abnormality. Uterus is absent.
Right ovary unremarkable. Left ovary not discretely identified.

No free air identified. Possible trace free fluid within the pelvis
(series 2, image 67). This is of doubtful clinical significance.

Few mildly prominent mesenteric lymph nodes measuring up to 1 cm
present at the root of the mesenteric. Minimal hazy mesenteric
stranding at the root of the mesentery as well.

Normal intravascular enhancement seen throughout the intra-abdominal
aorta and its branch vessels. Minimal atheromatous plaque about the
aortic bifurcation. No aneurysm.

No acute osseus abnormality. No worrisome lytic or blastic osseous
lesions. Degenerative spondylolysis at L4-5 and L5-S1.
IMPRESSION: 1. Mild hazy stranding at the root of the mesentery with a few
prominent mesenteric lymph nodes measuring to the upper limits of
normal at 1 cm. These findings are nonspecific, but may be reactive
in nature, most commonly to an underlying occult enteritis.
2. No other acute intra-abdominal or pelvic process identified.
3. Status post appendectomy and hysterectomy.

## 2018-02-15 ENCOUNTER — Other Ambulatory Visit (INDEPENDENT_AMBULATORY_CARE_PROVIDER_SITE_OTHER): Payer: Medicare Other

## 2018-02-15 DIAGNOSIS — E7800 Pure hypercholesterolemia, unspecified: Secondary | ICD-10-CM

## 2018-02-15 DIAGNOSIS — E119 Type 2 diabetes mellitus without complications: Secondary | ICD-10-CM

## 2018-02-15 LAB — HEMOGLOBIN A1C: Hgb A1c MFr Bld: 7.3 % — ABNORMAL HIGH (ref 4.6–6.5)

## 2018-02-15 LAB — LIPID PANEL
CHOL/HDL RATIO: 3
Cholesterol: 260 mg/dL — ABNORMAL HIGH (ref 0–200)
HDL: 77.2 mg/dL (ref >39.00)
LDL Cholesterol: 168 mg/dL — ABNORMAL HIGH (ref 0–99)
NonHDL: 182.33
Triglycerides: 70 mg/dL (ref 0.0–149.0)
VLDL: 14 mg/dL (ref 0.0–40.0)

## 2018-02-15 LAB — COMPREHENSIVE METABOLIC PANEL
ALT: 17 U/L (ref 0–35)
AST: 18 U/L (ref 0–37)
Albumin: 4.3 g/dL (ref 3.5–5.2)
Alkaline Phosphatase: 71 U/L (ref 39–117)
BUN: 20 mg/dL (ref 6–23)
CO2: 28 mEq/L (ref 19–32)
Calcium: 10.7 mg/dL — ABNORMAL HIGH (ref 8.4–10.5)
Chloride: 106 mEq/L (ref 96–112)
Creatinine, Ser: 0.8 mg/dL (ref 0.40–1.20)
GFR: 90.51 mL/min (ref >60.00)
Glucose, Bld: 110 mg/dL — ABNORMAL HIGH (ref 70–99)
Potassium: 4.6 mEq/L (ref 3.5–5.1)
Sodium: 141 mEq/L (ref 135–145)
Total Bilirubin: 0.4 mg/dL (ref 0.2–1.2)
Total Protein: 7.5 g/dL (ref 6.0–8.3)

## 2018-02-20 ENCOUNTER — Encounter: Payer: Self-pay | Admitting: Endocrinology

## 2018-02-20 ENCOUNTER — Other Ambulatory Visit: Payer: Self-pay

## 2018-02-20 ENCOUNTER — Ambulatory Visit (INDEPENDENT_AMBULATORY_CARE_PROVIDER_SITE_OTHER): Payer: Medicare Other | Admitting: Endocrinology

## 2018-02-20 VITALS — BP 132/78 | HR 80 | Ht 62.5 in | Wt 175.8 lb

## 2018-02-20 DIAGNOSIS — Z23 Encounter for immunization: Secondary | ICD-10-CM | POA: Diagnosis not present

## 2018-02-20 DIAGNOSIS — E1165 Type 2 diabetes mellitus with hyperglycemia: Secondary | ICD-10-CM

## 2018-02-20 NOTE — Progress Notes (Signed)
Amanda [name] is an 73 y.o. female.             Reason for Appointment: Diabetes follow-up   Initial diagnosis of diabetes:?  2007   History of Present Illness   Diagnosis: Type 2 DIABETES MELITUS     Previous history: She has had difficulty with blood sugar control initially because of limited tolerability with metformin and difficulty with weight loss. She did very well with starting Victoza which has helped her blood sugar control as well as some improvement in her obesity Her insurance company did not prefer Victoza and she was switched to Trulicity Subsequently her glipizide was stopped She was started on Invokana 100 mg daily in addition to her Trulicity 4.09 mg when her blood sugars were higher in 2017  Recent history:    Her A1c is significantly higher at 7.3 compared to 6.4  Non-insulin hypoglycemic drugs:   Invokana 811 mg daily  Trulicity 9.14 mg weekly   Current blood sugar patterns, management and problems identified:   She was last seen in 09/2017  Has gained 5 pounds since her last visit  She thinks that some of her high blood sugars are related to difficulty with getting her prescription refilled for Trulicity and more recently has been out of it for about 2 weeks  She has not exercised as much  Also has gained weight probably over the last 2 months from celebrations  She is checking her blood sugars mostly in the evenings around suppertime  Lab glucose was 110           Side effects from medications: Diarrhea from metformin.  Nausea from 1.5 mg Trulicity         Monitors blood glucose:  less than Once a day.    Glucometer: One Touch ultra 2  Readings checked in the evenings with a range of 100-199 and MEDIAN 124  Physical activity: exercise: walking on treadmill 3-4 days upto 30 min            Wt Readings from Last 3 Encounters:  02/20/18 175 lb 12.8 oz (79.7 kg)  09/17/17 170 lb 9.6 oz (77.4 kg)  07/17/17 169 lb 1.6 oz (76.7 kg)     LABS:  Lab Results  Component Value Date   HGBA1C 7.3 (H) 02/15/2018   HGBA1C 6.4 09/14/2017   HGBA1C 6.8 (H) 05/16/2017   Lab Results  Component Value Date   MICROALBUR <0.7 05/16/2017   LDLCALC 168 (H) 02/15/2018   CREATININE 0.80 02/15/2018     Lab on 02/15/2018  Component Date Value Ref Range Status  . Cholesterol 02/15/2018 260* 0 - 200 mg/dL Final   ATP III Classification       Desirable:  < 200 mg/dL               Borderline High:  200 - 239 mg/dL          High:  > = 240 mg/dL  . Triglycerides 02/15/2018 70.0  0.0 - 149.0 mg/dL Final   Normal:  <150 mg/dLBorderline High:  150 - 199 mg/dL  . HDL 02/15/2018 77.20  >39.00 mg/dL Final  . VLDL 02/15/2018 14.0  0.0 - 40.0 mg/dL Final  . LDL Cholesterol 02/15/2018 168* 0 - 99 mg/dL Final  . Total CHOL/HDL Ratio 02/15/2018 3   Final                  Men          Women1/2  Average Risk     3.4          3.3Average Risk          5.0          4.42X Average Risk          9.6          7.13X Average Risk          15.0          11.0                      . NonHDL 02/15/2018 182.33   Final   NOTE:  Non-HDL goal should be 30 mg/dL higher than patient's LDL goal (i.e. LDL goal of < 70 mg/dL, would have non-HDL goal of < 100 mg/dL)  . Sodium 02/15/2018 141  135 - 145 mEq/L Final  . Potassium 02/15/2018 4.6  3.5 - 5.1 mEq/L Final  . Chloride 02/15/2018 106  96 - 112 mEq/L Final  . CO2 02/15/2018 28  19 - 32 mEq/L Final  . Glucose, Bld 02/15/2018 110* 70 - 99 mg/dL Final  . BUN 02/15/2018 20  6 - 23 mg/dL Final  . Creatinine, Ser 02/15/2018 0.80  0.40 - 1.20 mg/dL Final  . Total Bilirubin 02/15/2018 0.4  0.2 - 1.2 mg/dL Final  . Alkaline Phosphatase 02/15/2018 71  39 - 117 U/L Final  . AST 02/15/2018 18  0 - 37 U/L Final  . ALT 02/15/2018 17  0 - 35 U/L Final  . Total Protein 02/15/2018 7.5  6.0 - 8.3 g/dL Final  . Albumin 02/15/2018 4.3  3.5 - 5.2 g/dL Final  . Calcium 02/15/2018 10.7* 8.4 - 10.5 mg/dL Final  . GFR 02/15/2018 90.51   >60.00 mL/min Final  . Hgb A1c MFr Bld 02/15/2018 7.3* 4.6 - 6.5 % Final   Glycemic Control Guidelines for People with Diabetes:Non Diabetic:  <6%Goal of Therapy: <7%Additional Action Suggested:  >8%     Allergies as of 02/20/2018      Reactions   Statins Other (See Comments)   Muscle pain. Pt able to take Crestor but not everyday.   Meperidine Hcl Rash      Medication List       Accurate as of February 20, 2018  8:55 AM. Always use your most recent med list.        anastrozole 1 MG tablet Commonly known as:  ARIMIDEX Take 1 tablet (1 mg total) by mouth daily.   aspirin EC 325 MG tablet Take 1 tablet (325 mg total) by mouth 2 (two) times daily after a meal. Take x 1 month post op to decrease risk of blood clots.   cloNIDine 0.1 mg/24hr patch Commonly known as:  CATAPRES - Dosed in mg/24 hr APPLY 1 PATCH EVERY WEEK   INVOKANA 300 MG Tabs tablet Generic drug:  canagliflozin TAKE 1 TABLET BY MOUTH DAILY BEFORE THE FIRST MEAL OF THE DAY   olopatadine 0.1 % ophthalmic solution Commonly known as:  PATANOL Instill 1 drop in left eye twice daily   ONE TOUCH ULTRA 2 w/Device Kit 1 each by Does not apply route daily. Use daily to check blood sugar.   ONE TOUCH ULTRA TEST test strip Generic drug:  glucose blood USE TO TEST BLOOD SUGAR ONE TIME DAILY AS INSTRUCTED   Pitavastatin Calcium 4 MG Tabs Commonly known as:  LIVALO Take one tablet daily   TRULICITY 5.46 FK/8.1EX Sopn Generic drug:  Dulaglutide  INJECT 0.75 MG UNDER THE SKIN ONCE A WEEK   Vitamin D3 50 MCG (2000 UT) Tabs Take 6,000 Units by mouth daily.       Allergies:  Allergies  Allergen Reactions  . Statins Other (See Comments)    Muscle pain. Pt able to take Crestor but not everyday.  . Meperidine Hcl Rash    Past Medical History:  Diagnosis Date  . Arthritis   . Breast cancer ([place])   . Breast cancer of upper-outer quadrant of right female breast ([place]) 03/24/2015  . Carpal tunnel syndrome   .  Diabetes mellitus without complication ([place])    type 2  . GERD (gastroesophageal reflux disease)    Pt takes OTC when needed.  Marland Kitchen Hot flashes     Past Surgical History:  Procedure Laterality Date  . ABDOMINAL HYSTERECTOMY    . APPENDECTOMY    . COLONOSCOPY    . ESOPHAGOGASTRODUODENOSCOPY    . EYE SURGERY Bilateral    Cataracts removed  . JOINT REPLACEMENT    . RADIOACTIVE SEED GUIDED PARTIAL MASTECTOMY WITH AXILLARY SENTINEL LYMPH NODE BIOPSY Right 04/16/2015   Procedure: RADIOACTIVE SEED GUIDED PARTIAL MASTECTOMY WITH AXILLARY SENTINEL LYMPH NODE BIOPSY;  Surgeon: Alphonsa Overall, MD;  Location: [place];  Service: General;  Laterality: Right;  . SHOULDER SURGERY Right   . TONSILLECTOMY    . TOTAL KNEE ARTHROPLASTY Right 07/18/2013   DR GRAVES  . TOTAL KNEE ARTHROPLASTY Right 07/18/2013   Procedure: TOTAL KNEE ARTHROPLASTY;  Surgeon: Alta Corning, MD;  Location: [place];  Service: Orthopedics;  Laterality: Right;  . TOTAL KNEE ARTHROPLASTY Left 06/15/2017   Procedure: LEFT TOTAL KNEE ARTHROPLASTY;  Surgeon: Dorna Leitz, MD;  Location: [facility];  Service: Orthopedics;  Laterality: Left;    History reviewed. No pertinent family history.  Social History:  reports that she has never smoked. She has never used smokeless tobacco. She reports current alcohol use. She reports that she does not use drugs.  Review of Systems:  Lipids:  She was switched from Crestor to Livalo because of her difficulty tolerating this because of joint pains . She is taking Livalo 4 mg, about 3-4 days a week She thinks she had difficulty getting her prescription refills done and only started back on the medication last week  Previously baseline LDL has been as high as 200 and is now back up to 268     Lab Results  Component Value Date   CHOL 260 (H) 02/15/2018   HDL 77.20 02/15/2018   LDLCALC 168 (H) 02/15/2018   LDLDIRECT 120.1 09/30/2013   TRIG 70.0 02/15/2018   CHOLHDL 3 02/15/2018     She takes Catapres patch prescribed by her oncologist mostly for hot flashes and not for hypertension  Foot exam done in 8/19, normal exam  Eye exam 10/18, no abnormalities found     Examination:     BP 132/78 (BP Location: Left Arm, Patient Position: Sitting, Cuff Size: Normal)   Pulse 80   Ht 5' 2.5" (1.588 m)   Wt 175 lb 12.8 oz (79.7 kg)   SpO2 96%   BMI 31.64 kg/m   Body mass index is 31.64 kg/m.     ASSESSMENT/ PLAN:  Diabetes type 2  See history of present illness for discussion of current diabetes management, blood sugar patterns and problems identified  Her blood sugars are poorly controlled but she thinks this is from not getting her Trulicity regularly Also has gained weight  She  has not checked her sugars after breakfast or lunch and needs to change the times when she checks them by rotation Encouraged her to be more consistent with diet and exercise Follow-up in 3 months and to call if blood sugars are staying consistently higher  HYPERCHOLESTEROLEMIA:  She will try to take her Livalo consistently and will repeat labs on the next visit  Influenza vaccine given   There are no Patient Instructions on file for this visit.   [name] 02/20/2018, 8:55 AM

## 2018-02-20 NOTE — Patient Instructions (Addendum)
Check blood sugars on waking up 1-2 days a week  Also check blood sugars about 2 hours after meals and do this after different meals by rotation  Recommended blood sugar levels on waking up are 90-130 and about 2 hours after meal is 130-160  Please bring your blood sugar monitor to each visit, thank you  Watch weight

## 2018-03-02 ENCOUNTER — Other Ambulatory Visit: Payer: Self-pay | Admitting: Oncology

## 2018-03-04 NOTE — Telephone Encounter (Signed)
Next Scheduled F/U 07-22-2018.

## 2018-05-21 ENCOUNTER — Other Ambulatory Visit: Payer: Medicare Other

## 2018-05-24 ENCOUNTER — Ambulatory Visit: Payer: Medicare Other | Admitting: Endocrinology

## 2018-07-05 ENCOUNTER — Other Ambulatory Visit (INDEPENDENT_AMBULATORY_CARE_PROVIDER_SITE_OTHER): Payer: Medicare Other

## 2018-07-05 ENCOUNTER — Other Ambulatory Visit: Payer: Self-pay

## 2018-07-05 DIAGNOSIS — E1165 Type 2 diabetes mellitus with hyperglycemia: Secondary | ICD-10-CM | POA: Diagnosis not present

## 2018-07-05 LAB — COMPREHENSIVE METABOLIC PANEL
ALT: 19 U/L (ref 0–35)
AST: 24 U/L (ref 0–37)
Albumin: 4.3 g/dL (ref 3.5–5.2)
Alkaline Phosphatase: 62 U/L (ref 39–117)
BUN: 26 mg/dL — ABNORMAL HIGH (ref 6–23)
CO2: 25 mEq/L (ref 19–32)
Calcium: 10.1 mg/dL (ref 8.4–10.5)
Chloride: 107 mEq/L (ref 96–112)
Creatinine, Ser: 0.94 mg/dL (ref 0.40–1.20)
GFR: 70.62 mL/min (ref >60.00)
Glucose, Bld: 113 mg/dL — ABNORMAL HIGH (ref 70–99)
Potassium: 4.3 mEq/L (ref 3.5–5.1)
Sodium: 139 mEq/L (ref 135–145)
Total Bilirubin: 0.5 mg/dL (ref 0.2–1.2)
Total Protein: 7.6 g/dL (ref 6.0–8.3)

## 2018-07-05 LAB — MICROALBUMIN / CREATININE URINE RATIO
Creatinine,U: 62.5 mg/dL
Microalb Creat Ratio: 1.2 mg/g (ref 0.0–30.0)
Microalb, Ur: 0.8 mg/dL (ref 0.0–1.9)

## 2018-07-05 LAB — HEMOGLOBIN A1C: Hgb A1c MFr Bld: 6.6 % — ABNORMAL HIGH (ref 4.6–6.5)

## 2018-07-06 ENCOUNTER — Other Ambulatory Visit: Payer: Self-pay | Admitting: Endocrinology

## 2018-07-10 ENCOUNTER — Ambulatory Visit (INDEPENDENT_AMBULATORY_CARE_PROVIDER_SITE_OTHER): Payer: Medicare Other | Admitting: Endocrinology

## 2018-07-10 ENCOUNTER — Encounter: Payer: Self-pay | Admitting: Endocrinology

## 2018-07-10 ENCOUNTER — Other Ambulatory Visit: Payer: Self-pay

## 2018-07-10 VITALS — BP 120/68 | HR 62 | Ht 62.5 in | Wt 180.2 lb

## 2018-07-10 DIAGNOSIS — E1165 Type 2 diabetes mellitus with hyperglycemia: Secondary | ICD-10-CM | POA: Diagnosis not present

## 2018-07-10 NOTE — Progress Notes (Signed)
Amanda [name] is an 73 y.o. female.             Reason for Appointment: Diabetes follow-up   Initial diagnosis of diabetes:?  2007   History of Present Illness   Diagnosis: Type 2 DIABETES MELITUS     Previous history: She has had difficulty with blood sugar control initially because of limited tolerability with metformin and difficulty with weight loss. She did very well with starting Victoza which has helped her blood sugar control as well as some improvement in her obesity Her insurance company did not prefer Victoza and she was switched to Trulicity Subsequently her glipizide was stopped She was started on Invokana 100 mg daily in addition to her Trulicity 7.32 mg when her blood sugars were higher in 2017  Recent history:    Her A1c is  higher at 7.3 compared to 6.4  Non-insulin hypoglycemic drugs:   Invokana 202 mg daily  Trulicity 5.42 mg weekly   Current blood sugar patterns, management and problems identified:   She was last seen in 1/20  Has gained another 5 pounds since her last visit although last year had probably lost weight after her knee surgery  She thinks she has not been consistent with watching her diet, occasionally getting higher fat foods  She thinks she is still trying to keep up with her exercise  Forgets to check her sugars after meals  Lab glucose was 113 fasting           Side effects from medications: Diarrhea from metformin.  Nausea from 1.5 mg Trulicity         Monitors blood glucose:  less than Once a day.    Glucometer: One Touch ultra 2  Readings checked mostly midday Sugar range 110-123 median 120  Physical activity: exercise: walking on treadmill 3-4 days upto 60 min            Wt Readings from Last 3 Encounters:  07/10/18 180 lb 3.2 oz (81.7 kg)  02/20/18 175 lb 12.8 oz (79.7 kg)  09/17/17 170 lb 9.6 oz (77.4 kg)    LABS:  Lab Results  Component Value Date   HGBA1C 6.6 (H) 07/05/2018   HGBA1C 7.3 (H) 02/15/2018   HGBA1C 6.4 09/14/2017   Lab Results  Component Value Date   MICROALBUR 0.8 07/05/2018   LDLCALC 168 (H) 02/15/2018   CREATININE 0.94 07/05/2018     Lab on 07/05/2018  Component Date Value Ref Range Status  . Microalb, Ur 07/05/2018 0.8  0.0 - 1.9 mg/dL Final  . Creatinine,U 07/05/2018 62.5  mg/dL Final  . Microalb Creat Ratio 07/05/2018 1.2  0.0 - 30.0 mg/g Final  . Sodium 07/05/2018 139  135 - 145 mEq/L Final  . Potassium 07/05/2018 4.3  3.5 - 5.1 mEq/L Final  . Chloride 07/05/2018 107  96 - 112 mEq/L Final  . CO2 07/05/2018 25  19 - 32 mEq/L Final  . Glucose, Bld 07/05/2018 113* 70 - 99 mg/dL Final  . BUN 07/05/2018 26* 6 - 23 mg/dL Final  . Creatinine, Ser 07/05/2018 0.94  0.40 - 1.20 mg/dL Final  . Total Bilirubin 07/05/2018 0.5  0.2 - 1.2 mg/dL Final  . Alkaline Phosphatase 07/05/2018 62  39 - 117 U/L Final  . AST 07/05/2018 24  0 - 37 U/L Final  . ALT 07/05/2018 19  0 - 35 U/L Final  . Total Protein 07/05/2018 7.6  6.0 - 8.3 g/dL Final  . Albumin 07/05/2018 4.3  3.5 - 5.2 g/dL Final  . Calcium 07/05/2018 10.1  8.4 - 10.5 mg/dL Final  . GFR 07/05/2018 70.62  >60.00 mL/min Final  . Hgb A1c MFr Bld 07/05/2018 6.6* 4.6 - 6.5 % Final   Glycemic Control Guidelines for People with Diabetes:Non Diabetic:  <6%Goal of Therapy: <7%Additional Action Suggested:  >8%     Allergies as of 07/10/2018      Reactions   Statins Other (See Comments)   Muscle pain. Pt able to take Crestor but not everyday.   Meperidine Hcl Rash      Medication List       Accurate as of July 10, 2018 11:21 AM. If you have any questions, ask your nurse or doctor.        anastrozole 1 MG tablet Commonly known as:  ARIMIDEX Take 1 tablet (1 mg total) by mouth daily.   aspirin EC 325 MG tablet Take 1 tablet (325 mg total) by mouth 2 (two) times daily after a meal. Take x 1 month post op to decrease risk of blood clots. What changed:  additional instructions   cloNIDine 0.1 mg/24hr patch Commonly  known as:  CATAPRES - Dosed in mg/24 hr APPLY 1 PATCH EXTERNALLY TO THE SKIN EVERY WEEK   Invokana 300 MG Tabs tablet Generic drug:  canagliflozin TAKE 1 TABLET BY MOUTH DAILY BEFORE THE FIRST MEAL OF THE DAY   olopatadine 0.1 % ophthalmic solution Commonly known as:  PATANOL Instill 1 drop in left eye twice daily   ONE TOUCH ULTRA 2 w/Device Kit 1 each by Does not apply route daily. Use daily to check blood sugar.   ONE TOUCH ULTRA TEST test strip Generic drug:  glucose blood USE TO TEST BLOOD SUGAR ONE TIME DAILY AS INSTRUCTED   Pitavastatin Calcium 4 MG Tabs Commonly known as:  Livalo Take one tablet daily   Trulicity 0.17 PZ/0.2HE Sopn Generic drug:  Dulaglutide INJECT 0.75 MG UNDER THE SKIN ONCE A WEEK   Vitamin D3 50 MCG (2000 UT) Tabs Take 6,000 Units by mouth daily.       Allergies:  Allergies  Allergen Reactions  . Statins Other (See Comments)    Muscle pain. Pt able to take Crestor but not everyday.  . Meperidine Hcl Rash    Past Medical History:  Diagnosis Date  . Arthritis   . Breast cancer ([place])   . Breast cancer of upper-outer quadrant of right female breast ([place]) 03/24/2015  . Carpal tunnel syndrome   . Diabetes mellitus without complication ([place])    type 2  . GERD (gastroesophageal reflux disease)    Pt takes OTC when needed.  Marland Kitchen Hot flashes     Past Surgical History:  Procedure Laterality Date  . ABDOMINAL HYSTERECTOMY    . APPENDECTOMY    . COLONOSCOPY    . ESOPHAGOGASTRODUODENOSCOPY    . EYE SURGERY Bilateral    Cataracts removed  . JOINT REPLACEMENT    . RADIOACTIVE SEED GUIDED PARTIAL MASTECTOMY WITH AXILLARY SENTINEL LYMPH NODE BIOPSY Right 04/16/2015   Procedure: RADIOACTIVE SEED GUIDED PARTIAL MASTECTOMY WITH AXILLARY SENTINEL LYMPH NODE BIOPSY;  Surgeon: Alphonsa Overall, MD;  Location: [place];  Service: General;  Laterality: Right;  . SHOULDER SURGERY Right   . TONSILLECTOMY    . TOTAL KNEE ARTHROPLASTY Right  07/18/2013   DR GRAVES  . TOTAL KNEE ARTHROPLASTY Right 07/18/2013   Procedure: TOTAL KNEE ARTHROPLASTY;  Surgeon: Alta Corning, MD;  Location: [place];  Service:  Orthopedics;  Laterality: Right;  . TOTAL KNEE ARTHROPLASTY Left 06/15/2017   Procedure: LEFT TOTAL KNEE ARTHROPLASTY;  Surgeon: Dorna Leitz, MD;  Location: [facility];  Service: Orthopedics;  Laterality: Left;    History reviewed. No pertinent family history.  Social History:  reports that she has never smoked. She has never used smokeless tobacco. She reports current alcohol use. She reports that she does not use drugs.  Review of Systems:  Lipids:  She was switched from Crestor to Livalo because of her difficulty tolerating this because of joint pains . She is taking Livalo 4 mg, about 3 days a week    Previously baseline LDL has been as high as 200    Lab Results  Component Value Date   CHOL 260 (H) 02/15/2018   HDL 77.20 02/15/2018   LDLCALC 168 (H) 02/15/2018   LDLDIRECT 120.1 09/30/2013   TRIG 70.0 02/15/2018   CHOLHDL 3 02/15/2018    She takes Catapres patch prescribed by her oncologist mostly for hot flashes and not for hypertension  Foot exam done in 8/19, normal exam  Eye exam 10/18, no abnormalities found     Examination:     BP 120/68 (BP Location: Left Arm, Patient Position: Sitting, Cuff Size: Normal)   Pulse 62   Ht 5' 2.5" (1.588 m)   Wt 180 lb 3.2 oz (81.7 kg)   SpO2 97%   BMI 32.43 kg/m   Body mass index is 32.43 kg/m.     ASSESSMENT/ PLAN:  Diabetes type 2 with mild obesity  See history of present illness for discussion of current diabetes management, blood sugar patterns and problems identified  Her A1c has improved from her last visit but still not her best  She continues to gain weight This is likely to be from not being consistent with modifying her diet to reduce calories and carbohydrates Currently not checking sugars after evening meal  Although she may benefit from higher  dose of Trulicity she had nausea with this previously Discussed trying to check sugars after evening meal more consistently and can at least do them at bedtime She will try to continue to improve her diet for providing some weight loss Discussed importance of treating obesity long-term and improving insulin sensitivity  HYPERCHOLESTEROLEMIA:  To check labs on the next visit with current regimen of Livalo   Microalbumin normal  Patient Instructions  Check blood sugars on waking up days 2 a week  Also check blood sugars about 2 hours after meals and do this after different meals by rotation  Recommended blood sugar levels on waking up are 90-130 and about 2 hours after meal is 130-160  Please bring your blood sugar monitor to each visit, thank you      [name] 07/10/2018, 11:21 AM

## 2018-07-10 NOTE — Patient Instructions (Signed)
Check blood sugars on waking up days 2 a week  Also check blood sugars about 2 hours after meals and do this after different meals by rotation  Recommended blood sugar levels on waking up are 90-130 and about 2 hours after meal is 130-160  Please bring your blood sugar monitor to each visit, thank you

## 2018-07-19 ENCOUNTER — Telehealth: Payer: Self-pay | Admitting: Oncology

## 2018-07-19 NOTE — Telephone Encounter (Signed)
Called patient regarding upcoming [facility] appointment, left patient a voicemail and informed her this will be a telephone visit unless patient calls back to set up a virtual visit.

## 2018-07-21 NOTE — Progress Notes (Signed)
[facility]  [phone]) [phone] [phone]) [phone]     ID: AHMIYAH COIL DOB: May 13, 1945  MR#: [id number]  ASN#:[id number]  Patient Care Team: Antony Contras, MD as PCP - General (Family Medicine) Sylvan Cheese, NP as Nurse Practitioner (Hematology and Oncology) Alfredo Collymore, Virgie Dad, MD as Consulting Physician (Oncology) Thea Silversmith, MD as Consulting Physician (Radiation Oncology) Alphonsa Overall, MD as Consulting Physician (General Surgery) [name], MD as Consulting Physician (Endocrinology) GYN: OTHER MD:  CHIEF COMPLAINT: estrogen receptor positive breast cancer  CURRENT TREATMENT: anastrozole   I connected with Sallee Lange on 07/22/18 at  1:30 PM EDT by telephone visit and verified that I am speaking with the correct person using two identifiers.   I discussed the limitations, risks, security and privacy concerns of performing an evaluation and management service by telemedicine and the availability of in-person appointments. I also discussed with the patient that there may be a patient responsible charge related to this service. The patient expressed understanding and agreed to proceed.   Other persons participating in the visit and their role in the encounter:   - [place]   Patient's location: Her workplace  Provider's location: [facility]   Chief Complaint: estrogen receptor positive breast cancer    BREAST CANCER HISTORY:  from the original intake note:  Calisha had routine screening mammography at [facility] 03/10/2015. The breast density was category B. There was a new cluster of nodules in the right breast at the 11:00 position measuring approximately 5 mm. Ultrasonography 03/15/2014 confirmed a 1.1 center meter oval mass in the right breast at the 10:00 position 6 cm from the nipple. There was a benign 0.8 cm simple cyst immediately adjacent to the mass. The right axilla was sonographically benign.   Biopsy of the right breast mass in question 03/22/2015 showed ([id number]) invasive ductal carcinoma, grade 1, estrogen receptor 95% positive, progesterone receptor 80% positive, both with strong staining intensity, with an MIB-1 of 15%, and no HER-2 amplification, the signals ratio being 1.58 and the number per cell 3.00.  Her subsequent history is as detailed below   INTERVAL HISTORY:  Mireyah was contacted today for follow-up and treatment of her estrogen receptor positive breast cancer  She continues on anastrozole. She is wearing a clonidine patch to help with her hot flashes.   Since her last visit here, she underwent a digital diagnostic mammogram with tomography on 06/18/2018 showing: Breast Density Category B. There is no mammographic evidence of malignancy.   REVIEW OF SYSTEMS: Brittane is at work at the time of the call. 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 She is taking appropriate preventative measures against the spread of COVID-19. She notes some vaginal discharge that lasted a few weeks; it was heavy and like mucus. It has improved, but has not disappeared. She has not treated this with anything. She exercises three to four times per week for at least an hour per day on the treadmill. The patient denies unusual headaches, visual changes, nausea, vomiting, or dizziness. There has been no unusual cough, phlegm production, or pleurisy. This been no change in bowel or bladder habits. The patient denies unexplained fatigue or unexplained weight loss, bleeding, rash, or fever. A detailed review of systems was otherwise noncontributory.     PAST MEDICAL HISTORY: Past Medical History:  Diagnosis Date  . Arthritis   . Breast cancer ([place])   . Breast cancer  of upper-outer quadrant of right female breast ([place]) 03/24/2015  . Carpal tunnel syndrome   . Diabetes mellitus without complication ([place])    type 2  . GERD (gastroesophageal reflux  disease)    Pt takes OTC when needed.  Marland Kitchen Hot flashes     PAST SURGICAL HISTORY: Past Surgical History:  Procedure Laterality Date  . ABDOMINAL HYSTERECTOMY    . APPENDECTOMY    . COLONOSCOPY    . ESOPHAGOGASTRODUODENOSCOPY    . EYE SURGERY Bilateral    Cataracts removed  . JOINT REPLACEMENT    . RADIOACTIVE SEED GUIDED PARTIAL MASTECTOMY WITH AXILLARY SENTINEL LYMPH NODE BIOPSY Right 04/16/2015   Procedure: RADIOACTIVE SEED GUIDED PARTIAL MASTECTOMY WITH AXILLARY SENTINEL LYMPH NODE BIOPSY;  Surgeon: Alphonsa Overall, MD;  Location: [facility];  Service: General;  Laterality: Right;  . SHOULDER SURGERY Right   . TONSILLECTOMY    . TOTAL KNEE ARTHROPLASTY Right 07/18/2013   DR GRAVES  . TOTAL KNEE ARTHROPLASTY Right 07/18/2013   Procedure: TOTAL KNEE ARTHROPLASTY;  Surgeon: Alta Corning, MD;  Location: [facility];  Service: Orthopedics;  Laterality: Right;  . TOTAL KNEE ARTHROPLASTY Left 06/15/2017   Procedure: LEFT TOTAL KNEE ARTHROPLASTY;  Surgeon: Dorna Leitz, MD;  Location: [facility];  Service: Orthopedics;  Laterality: Left;    FAMILY HISTORY No family history on file. The patient's father died in his 13s from reasons unclear to the patient. Her mother died from heart disease at the age of 28. The patient had one brother, 5 sisters. There is no history of breast or ovarian cancer in the family to her knowledge.  GYNECOLOGIC HISTORY:  No LMP recorded. Patient has had a hysterectomy. Menarche age 13, first live birth age 73. She is GX P2. She underwent hysterectomy with bilateral salpingo-oophorectomy in her 58s. 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  She works part-time doing Chief Strategy Officer.  She is divorced and lives alone, with no pets. Son [name] lives in [place] where he manages an auto zone. Daughter [name] lives in [place]. She works for [employer]. The  patient has 4 grandchildren. She is a Psychologist, forensic.    ADVANCED DIRECTIVES: Not in place   HEALTH MAINTENANCE: Social History   Tobacco Use  . Smoking status: Never Smoker  . Smokeless tobacco: Never Used  Substance Use Topics  . Alcohol use: Yes    Comment: socially, occ weekends  . Drug use: No     Colonoscopy: 2016[facility]  PAP: Status post hysterectomy  Bone density: 2016/ normal/ [facility]  Lipid panel:  Allergies  Allergen Reactions  . Statins Other (See Comments)    Muscle pain. Pt able to take Crestor but not everyday.  . Meperidine Hcl Rash    Current Outpatient Medications  Medication Sig Dispense Refill  . anastrozole (ARIMIDEX) 1 MG tablet Take 1 tablet (1 mg total) by mouth daily. 90 tablet 4  . aspirin EC 325 MG tablet Take 1 tablet (325 mg total) by mouth 2 (two) times daily after a meal. Take x 1 month post op to decrease risk of blood clots. (Patient taking differently: Take 325 mg by mouth 2 (two) times daily after a meal. Take 1 tablet by mouth once daily.) 60 tablet 0  . Blood Glucose Monitoring Suppl (ONE TOUCH ULTRA 2) w/Device KIT 1 each by Does not apply route daily. Use daily to check blood  sugar. 1 each 0  . Cholecalciferol (VITAMIN D3) 2000 units TABS Take 6,000 Units by mouth daily.     . cloNIDine (CATAPRES - DOSED IN MG/24 HR) 0.1 mg/24hr patch Place 1 patch (0.1 mg total) onto the skin once a week. 12 patch 4  . INVOKANA 300 MG TABS tablet TAKE 1 TABLET BY MOUTH DAILY BEFORE THE FIRST MEAL OF THE DAY 90 tablet 0  . olopatadine (PATANOL) 0.1 % ophthalmic solution Instill 1 drop in left eye twice daily  11  . ONE TOUCH ULTRA TEST test strip USE TO TEST BLOOD SUGAR ONE TIME DAILY AS INSTRUCTED 100 each 1  . Pitavastatin Calcium (LIVALO) 4 MG TABS Take one tablet daily 30 tablet 0  . TRULICITY 7.67 MC/9.4BS SOPN INJECT 0.75 MG UNDER THE SKIN ONCE A WEEK 4 mL 0   No current facility-administered medications for this visit.     OBJECTIVE: Middle-aged  African-American woman in no acute distress There were no vitals filed for this visit.   There is no height or weight on file to calculate BMI.    ECOG FS:1 - Symptomatic but completely ambulatory   LAB RESULTS:  CMP     Component Value Date/Time   NA 139 07/05/2018 0833   NA 144 05/09/2016 1108   K 4.3 07/05/2018 0833   K 4.9 05/09/2016 1108   CL 107 07/05/2018 0833   CO2 25 07/05/2018 0833   CO2 27 05/09/2016 1108   GLUCOSE 113 (H) 07/05/2018 0833   GLUCOSE 124 05/09/2016 1108   BUN 26 (H) 07/05/2018 0833   BUN 21.0 05/09/2016 1108   CREATININE 0.94 07/05/2018 0833   CREATININE 1.0 05/09/2016 1108   CALCIUM 10.1 07/05/2018 0833   CALCIUM 10.4 05/09/2016 1108   PROT 7.6 07/05/2018 0833   PROT 7.1 05/09/2016 1108   ALBUMIN 4.3 07/05/2018 0833   ALBUMIN 3.6 05/09/2016 1108   AST 24 07/05/2018 0833   AST 18 05/09/2016 1108   ALT 19 07/05/2018 0833   ALT 20 05/09/2016 1108   ALKPHOS 62 07/05/2018 0833   ALKPHOS 78 05/09/2016 1108   BILITOT 0.5 07/05/2018 0833   BILITOT 0.61 05/09/2016 1108   GFRNONAA 60 (L) 07/17/2017 0821   GFRAA >60 07/17/2017 0821    INo results found for: SPEP, UPEP  Lab Results  Component Value Date   WBC 6.0 07/17/2017   NEUTROABS 2.7 07/17/2017   HGB 12.8 07/17/2017   HCT 40.0 07/17/2017   MCV 96.9 07/17/2017   PLT 376 07/17/2017      Chemistry      Component Value Date/Time   NA 139 07/05/2018 0833   NA 144 05/09/2016 1108   K 4.3 07/05/2018 0833   K 4.9 05/09/2016 1108   CL 107 07/05/2018 0833   CO2 25 07/05/2018 0833   CO2 27 05/09/2016 1108   BUN 26 (H) 07/05/2018 0833   BUN 21.0 05/09/2016 1108   CREATININE 0.94 07/05/2018 0833   CREATININE 1.0 05/09/2016 1108      Component Value Date/Time   CALCIUM 10.1 07/05/2018 0833   CALCIUM 10.4 05/09/2016 1108   ALKPHOS 62 07/05/2018 0833   ALKPHOS 78 05/09/2016 1108   AST 24 07/05/2018 0833   AST 18 05/09/2016 1108   ALT 19 07/05/2018 0833   ALT 20 05/09/2016 1108   BILITOT  0.5 07/05/2018 0833   BILITOT 0.61 05/09/2016 1108       No results found for: LABCA2  No components found for: [id number]  No results for input(s): INR in the last 168 hours.  Urinalysis    Component Value Date/Time   COLORURINE STRAW (A) 06/07/2017 0838   APPEARANCEUR CLEAR 06/07/2017 0838   LABSPEC 1.015 06/07/2017 0838   PHURINE 5.0 06/07/2017 0838   GLUCOSEU >=500 (A) 06/07/2017 0838   GLUCOSEU NEGATIVE 04/29/2015 1010   HGBUR NEGATIVE 06/07/2017 0838   BILIRUBINUR NEGATIVE 06/07/2017 0838   KETONESUR NEGATIVE 06/07/2017 0838   PROTEINUR NEGATIVE 06/07/2017 0838   UROBILINOGEN 0.2 04/29/2015 1010   NITRITE NEGATIVE 06/07/2017 0838   LEUKOCYTESUR NEGATIVE 06/07/2017 0838      ELIGIBLE FOR AVAILABLE RESEARCH PROTOCOL: no   STUDIES: No results found.    ASSESSMENT: 73 y.o. [place] woman status post right breast upper outer quadrant biopsy 03/22/2015 for a clinical T1CN0, stage IA invasive ductal carcinoma, grade 1, estrogen and progesterone receptor positive, HER-2 not amplified, with an MIB-1 of 15%.  (1)  Status post right lumpectomy and sentinel lymph node sampling 04/16/2015 for a pT1c pN0, stage IA  Invasive ductal carcinoma, grade 1, repeat HER-2 again negative  (2) Oncotype DX  Score of 12 predicts a risk of outside the breast recurrence of 8% within 10 years if the patient's only systemic therapy is tamoxifen for 5 years. It also predicts no benefit from chemotherapy.  (3) adjuvant radiation completed 07/19/2015   (4) started anastrozole 10/08/2015, interrupted because of a rash, restarted 11/29/2015  (a) DEXA scan 05/28/2014 was normal with a T score of +0.7   PLAN: Meosha is now a little over 3 years out from definitive surgery for her breast cancer with no evidence of disease recurrence.  This is very favorable.  She is tolerating anastrozole with no unusual side effects and the plan will be to continue that a total of 5 years.  I suggested she  try miconazole suppositories for her vaginal discharge.  If that does not work she will call and we will write her some Diflucan  Otherwise she will have her next mammogram in May of next year and I will see her again in 1 year  She is taking appropriate pandemic precautions.  She knows to call for any other issues that may develop before the next visit.   Kynzleigh Bandel, Virgie Dad, MD  07/22/18 1:25 PM Medical Oncology and Hematology [facility] [address] [place], [place] [address] Tel. [phone]    Fax. [phone]   I, Jacqualyn Posey am acting as a Education administrator for Chauncey Cruel, MD.   I, Lurline Del MD, have reviewed the above documentation for accuracy and completeness, and I agree with the above.

## 2018-07-22 ENCOUNTER — Other Ambulatory Visit: Payer: Medicare Other

## 2018-07-22 ENCOUNTER — Other Ambulatory Visit: Payer: Self-pay | Admitting: Endocrinology

## 2018-07-22 ENCOUNTER — Inpatient Hospital Stay: Payer: Medicare Other | Attending: Oncology | Admitting: Oncology

## 2018-07-22 DIAGNOSIS — Z170 Estrogen receptor positive status [ER+]: Secondary | ICD-10-CM

## 2018-07-22 DIAGNOSIS — E119 Type 2 diabetes mellitus without complications: Secondary | ICD-10-CM | POA: Diagnosis not present

## 2018-07-22 DIAGNOSIS — C50411 Malignant neoplasm of upper-outer quadrant of right female breast: Secondary | ICD-10-CM

## 2018-07-22 DIAGNOSIS — Z79811 Long term (current) use of aromatase inhibitors: Secondary | ICD-10-CM

## 2018-07-22 DIAGNOSIS — Z79899 Other long term (current) drug therapy: Secondary | ICD-10-CM

## 2018-07-22 DIAGNOSIS — Z7982 Long term (current) use of aspirin: Secondary | ICD-10-CM

## 2018-07-22 MED ORDER — ANASTROZOLE 1 MG PO TABS: 1.0000 mg | ORAL_TABLET | Freq: Every day | ORAL | 4 refills | Status: DC

## 2018-07-22 MED ORDER — CLONIDINE 0.1 MG/24HR TD PTWK: 0.1000 mg | MEDICATED_PATCH | TRANSDERMAL | 4 refills | Status: DC

## 2018-07-23 ENCOUNTER — Telehealth: Payer: Self-pay | Admitting: Oncology

## 2018-07-23 NOTE — Telephone Encounter (Signed)
I talk with patient regarding schedule  

## 2018-10-02 ENCOUNTER — Emergency Department (HOSPITAL_COMMUNITY): Payer: Medicare Other

## 2018-10-02 ENCOUNTER — Emergency Department (HOSPITAL_COMMUNITY)
Admission: EM | Admit: 2018-10-02 | Discharge: 2018-10-02 | Disposition: A | Discharge status: Home / Self Care | Payer: Medicare Other | Attending: Emergency Medicine | Admitting: Emergency Medicine

## 2018-10-02 ENCOUNTER — Other Ambulatory Visit: Payer: Self-pay

## 2018-10-02 ENCOUNTER — Encounter (HOSPITAL_COMMUNITY): Payer: Self-pay | Admitting: Emergency Medicine

## 2018-10-02 DIAGNOSIS — Z79899 Other long term (current) drug therapy: Secondary | ICD-10-CM | POA: Insufficient documentation

## 2018-10-02 DIAGNOSIS — Z7984 Long term (current) use of oral hypoglycemic drugs: Secondary | ICD-10-CM | POA: Insufficient documentation

## 2018-10-02 DIAGNOSIS — E119 Type 2 diabetes mellitus without complications: Secondary | ICD-10-CM | POA: Diagnosis not present

## 2018-10-02 DIAGNOSIS — Z7982 Long term (current) use of aspirin: Secondary | ICD-10-CM | POA: Insufficient documentation

## 2018-10-02 DIAGNOSIS — M79641 Pain in right hand: Secondary | ICD-10-CM | POA: Diagnosis present

## 2018-10-02 MED ORDER — IBUPROFEN 600 MG PO TABS: 600.0000 mg | ORAL_TABLET | Freq: Three times a day (TID) | ORAL | 0 refills | Status: AC | PRN

## 2018-10-02 MED ORDER — HYDROCODONE-ACETAMINOPHEN 5-325 MG PO TABS: 1.0000 | ORAL_TABLET | Freq: Four times a day (QID) | ORAL | 0 refills | Status: DC | PRN

## 2018-10-02 MED ORDER — CEPHALEXIN 500 MG PO CAPS: 500.0000 mg | ORAL_CAPSULE | Freq: Two times a day (BID) | ORAL | 0 refills | Status: AC

## 2018-10-02 MED ORDER — IBUPROFEN 200 MG PO TABS
600.0000 mg | ORAL_TABLET | Freq: Once | ORAL | Status: AC
  Administered 2018-10-02: 600 mg via ORAL
  Filled 2018-10-02: qty 3

## 2018-10-02 MED ORDER — DICLOFENAC SODIUM 1 % TD GEL: 2.0000 g | Freq: Four times a day (QID) | TRANSDERMAL | 0 refills | Status: AC

## 2018-10-02 NOTE — ED Provider Notes (Signed)
[facility] DEPT Provider Note   CSN: [id number] Arrival date & time: 10/02/18  2003     History   Chief Complaint Chief Complaint  Patient presents with  . Hand Pain    HPI Amanda [name] is a 73 y.o. female.     HPI   Pt is a 73 y/o F with history of breast cancer, diabetes, GERD, presents the emergency department today for evaluation of pain to the right third finger.  Throughout the day pain is worsened and she has had some associated swelling to the hand.  She denies any fevers.  She denies any wounds to the hand.  She never had similar symptoms.  She denies a history of gout.  Past Medical History:  Diagnosis Date  . Arthritis   . Breast cancer ([place])   . Breast cancer of upper-outer quadrant of right female breast ([place]) 03/24/2015  . Carpal tunnel syndrome   . Diabetes mellitus without complication ([place])    type 2  . GERD (gastroesophageal reflux disease)    Pt takes OTC when needed.  Marland Kitchen Hot flashes     Patient Active Problem List   Diagnosis Date Noted  . Primary osteoarthritis of left knee 06/15/2017  . Diabetes mellitus type II, non insulin dependent ([place]) 03/31/2015  . Malignant neoplasm of upper-outer quadrant of right breast in female, estrogen receptor positive ([place]) 03/24/2015  . Acute blood loss anemia 07/20/2013  . Osteoarthritis of right knee 07/18/2013  . Osteoarthritis of left knee 07/18/2013  . Hyperlipidemia 11/26/2012  . Osteoarthritis of both knees 11/26/2012  . COUGH 03/11/2007    Past Surgical History:  Procedure Laterality Date  . ABDOMINAL HYSTERECTOMY    . APPENDECTOMY    . COLONOSCOPY    . ESOPHAGOGASTRODUODENOSCOPY    . EYE SURGERY Bilateral    Cataracts removed  . JOINT REPLACEMENT    . RADIOACTIVE SEED GUIDED PARTIAL MASTECTOMY WITH AXILLARY SENTINEL LYMPH NODE BIOPSY Right 04/16/2015   Procedure: RADIOACTIVE SEED GUIDED PARTIAL MASTECTOMY WITH AXILLARY SENTINEL LYMPH NODE BIOPSY;  Surgeon: Alphonsa Overall, MD;  Location: [facility];  Service: General;  Laterality: Right;  . SHOULDER SURGERY Right   . TONSILLECTOMY    . TOTAL KNEE ARTHROPLASTY Right 07/18/2013   DR GRAVES  . TOTAL KNEE ARTHROPLASTY Right 07/18/2013   Procedure: TOTAL KNEE ARTHROPLASTY;  Surgeon: Alta Corning, MD;  Location: [facility];  Service: Orthopedics;  Laterality: Right;  . TOTAL KNEE ARTHROPLASTY Left 06/15/2017   Procedure: LEFT TOTAL KNEE ARTHROPLASTY;  Surgeon: Dorna Leitz, MD;  Location[facility];  Service: Orthopedics;  Laterality: Left;     OB History   No obstetric history on file.      Home Medications    Prior to Admission medications   Medication Sig Start Date End Date Taking? Authorizing Provider  anastrozole (ARIMIDEX) 1 MG tablet Take 1 tablet (1 mg total) by mouth daily. 07/22/18   Magrinat, Virgie Dad, MD  aspirin EC 325 MG tablet Take 1 tablet (325 mg total) by mouth 2 (two) times daily after a meal. Take x 1 month post op to decrease risk of blood clots. Patient taking differently: Take 325 mg by mouth 2 (two) times daily after a meal. Take 1 tablet by mouth once daily. 06/15/17   Gary Fleet, PA-C  Blood Glucose Monitoring Suppl (ONE TOUCH ULTRA 2) w/Device KIT 1 each by Does not apply route daily. Use daily to check blood sugar. 01/14/18  [name], MD  cephALEXin (KEFLEX) 500 MG capsule Take 1 capsule (500 mg total) by mouth 2 (two) times daily for 5 days. 10/02/18 10/07/18  Emori Kamau S, PA-C  Cholecalciferol (VITAMIN D3) 2000 units TABS Take 6,000 Units by mouth daily.     [provider]  cloNIDine (CATAPRES - DOSED IN MG/24 HR) 0.1 mg/24hr patch Place 1 patch (0.1 mg total) onto the skin once a week. 07/22/18   Magrinat, Virgie Dad, MD  diclofenac sodium (VOLTAREN) 1 % GEL Apply 2 g topically 4 (four) times daily for 7 days. 10/02/18 10/09/18  Jakyrah Holladay S, PA-C  HYDROcodone-acetaminophen (NORCO/VICODIN) 5-325 MG tablet Take 1 tablet by mouth every 6 (six) hours  as needed. 10/02/18   Ranya Fiddler S, PA-C  ibuprofen (ADVIL) 600 MG tablet Take 1 tablet (600 mg total) by mouth every 8 (eight) hours as needed for up to 5 days. 10/02/18 10/07/18  Oriya Kettering S, PA-C  INVOKANA 300 MG TABS tablet TAKE 1 TABLET BY MOUTH DAILY BEFORE THE FIRST MEAL OF THE DAY 07/07/18   [name], MD  olopatadine (PATANOL) 0.1 % ophthalmic solution Instill 1 drop in left eye twice daily 05/14/17   [provider]  ONE TOUCH ULTRA TEST test strip USE TO TEST BLOOD SUGAR ONE TIME DAILY AS INSTRUCTED 07/17/16   [name], MD  Pitavastatin Calcium (LIVALO) 4 MG TABS Take one tablet daily 12/04/17   [name], MD  TRULICITY 5.46 FK/8.1EX SOPN INJECT 0.75 MG UNDER THE SKIN ONCE A WEEK 07/22/18   [name], MD    Family History History reviewed. No pertinent family history.  Social History Social History   Tobacco Use  . Smoking status: Never Smoker  . Smokeless tobacco: Never Used  Substance Use Topics  . Alcohol use: Yes    Comment: socially, occ weekends  . Drug use: No     Allergies   Statins and Meperidine hcl   Review of Systems Review of Systems  Constitutional: Negative for fever.  Musculoskeletal:       Right hand pain/swelling     Physical Exam Updated Vital Signs BP (!) 149/81 (BP Location: Left Arm)   Pulse 80   Temp 98.9 F (37.2 C) (Oral)   Resp 16   Ht 5' 2.5" (1.588 m)   Wt 79.8 kg   SpO2 96%   BMI 31.68 kg/m   Physical Exam Vitals signs and nursing note reviewed.  Constitutional:      General: She is not in acute distress.    Appearance: She is well-developed.  HENT:     Head: Normocephalic and atraumatic.  Eyes:     Conjunctiva/sclera: Conjunctivae normal.  Neck:     Musculoskeletal: Neck supple.  Cardiovascular:     Rate and Rhythm: Normal rate.  Pulmonary:     Effort: Pulmonary effort is normal.  Musculoskeletal:     Comments: TTP over the right 3rd MTP with overlying swelling. Mild warmth to the joint. No  fusiform swelling or TTP along the flexor tendon. No open wounds.   Skin:    General: Skin is warm and dry.  Neurological:     Mental Status: She is alert.     ED Treatments / Results  Labs (all labs ordered are listed, but only abnormal results are displayed) Labs Reviewed - No data to display  EKG None  Radiology Dg Hand Complete Right  Result Date: 10/02/2018 CLINICAL DATA:  No known injury, awoken with painful and swollen  hand. EXAM: RIGHT HAND - COMPLETE 3+ VIEW COMPARISON:  None. FINDINGS: No fracture or dislocation. Severe degenerative change the third MCP joint with near complete joint space loss and subchondral sclerosis. Mild degenerative change involving the first MTP joint with joint space loss subchondral sclerosis and osteophytosis. Query small peripheral sclerotic erosion about the palmar aspect of the distal end of the first metacarpal, best seen on the provided lateral radiograph. No additional erosions identified. Ossicle adjacent to the medial aspect the DIP joint of the second digit may represent sequela of remote avulsive injury. Suspected small amount of chondrocalcinosis within the TFCC. Suspected diffuse soft tissue swelling about the hand. No radiopaque foreign body. IMPRESSION: 1. Diffuse soft tissue swelling about the hand without associated fracture or dislocation. 2. Degenerative change of the hand as above - nonspecific and while potentially degenerative in etiology, given the peripheral sclerotic erosion about the first MCP joint and chondrocalcinosis within the TFCC, a crystalline arthropathy (CPPD) could have similar appearance. Electronically Signed   By: Sandi Mariscal M.D.   On: 10/02/2018 21:27    Procedures Procedures (including critical care time)  Medications Ordered in ED Medications - No data to display   Initial Impression / Assessment and Plan / ED Course  I have reviewed the triage vital signs and the nursing notes.  Pertinent labs & imaging  results that were available during my care of the patient were reviewed by me and considered in my medical decision making (see chart for details).   Final Clinical Impressions(s) / ED Diagnoses   Final diagnoses:  Right hand pain   Pt with pain and swelling over the 3rd MCP joint that started 1 day ago. No fevers or other systemic sxs. No hx of prior. Xray showed no acute fracture.  Severe degenerative change the third MCP joint with near complete joint space loss and subchondral sclerosis. There were also degenerative changes suggestive of possible crystalline arthropathy.   Seems unlikely related to septic arthritis. More likely related to inflammatory arthropathy from sever degenerative change. Will trial antiinflammatories for symptoms. Will give rx for keflex to cover potential cellulitis though I do have much lower suspicion for this. Will give ortho f/u. Advised on return precautions. She voices understanding and is in agreement with plan. All questions answered, pt stable for d/c.   ED Discharge Orders         Ordered    HYDROcodone-acetaminophen (NORCO/VICODIN) 5-325 MG tablet  Every 6 hours PRN     10/02/18 2256    diclofenac sodium (VOLTAREN) 1 % GEL  4 times daily     10/02/18 2256    cephALEXin (KEFLEX) 500 MG capsule  2 times daily     10/02/18 2256    ibuprofen (ADVIL) 600 MG tablet  Every 8 hours PRN     10/02/18 2256           Thoren Hosang S, PA-C 10/02/18 2257    Drenda Freeze, MD 10/03/18 [phone]

## 2018-10-02 NOTE — ED Triage Notes (Signed)
Patient complaining of right hand swelling and pain. Patient states she woke up this morning and her hand was swollen.

## 2018-10-02 NOTE — Discharge Instructions (Addendum)
You may take Naproxen twice daily as directed on your discharge paperwork. Also, make sure to take Naproxen with meals as it can cause an upset stomach. Do not take other NSAIDs while taking Naproxen such as (Aleve, Ibuprofen, Aspirin, Celebrex, etc) and do not take more than the prescribed dose as this can lead to ulcers and bleeding in your GI tract. You may use warm and cold compresses to help with your symptoms.   Prescription given for Norco. Take medication as directed and do not operate machinery, drive a car, or work while taking this medication as it can make you drowsy.   You were given information to follow up with an orthopedic doctor. Please call the office to set up an appointment for follow up in the next 3-5 days.   Please return to the ER sooner if you have any new or worsening symptoms. Return immediately for any fevers, redness, increased pain, or persistent pain despite medications.

## 2018-10-07 ENCOUNTER — Other Ambulatory Visit: Payer: Self-pay | Admitting: Endocrinology

## 2018-10-08 ENCOUNTER — Other Ambulatory Visit (INDEPENDENT_AMBULATORY_CARE_PROVIDER_SITE_OTHER): Payer: Medicare Other

## 2018-10-08 ENCOUNTER — Other Ambulatory Visit: Payer: Self-pay

## 2018-10-08 DIAGNOSIS — E1165 Type 2 diabetes mellitus with hyperglycemia: Secondary | ICD-10-CM

## 2018-10-08 LAB — COMPREHENSIVE METABOLIC PANEL
ALT: 18 U/L (ref 0–35)
AST: 21 U/L (ref 0–37)
Albumin: 4.5 g/dL (ref 3.5–5.2)
Alkaline Phosphatase: 56 U/L (ref 39–117)
BUN: 28 mg/dL — ABNORMAL HIGH (ref 6–23)
CO2: 26 mEq/L (ref 19–32)
Calcium: 10.6 mg/dL — ABNORMAL HIGH (ref 8.4–10.5)
Chloride: 106 mEq/L (ref 96–112)
Creatinine, Ser: 0.99 mg/dL (ref 0.40–1.20)
GFR: 66.47 mL/min (ref >60.00)
Glucose, Bld: 109 mg/dL — ABNORMAL HIGH (ref 70–99)
Potassium: 4.7 mEq/L (ref 3.5–5.1)
Sodium: 139 mEq/L (ref 135–145)
Total Bilirubin: 0.5 mg/dL (ref 0.2–1.2)
Total Protein: 7.8 g/dL (ref 6.0–8.3)

## 2018-10-08 LAB — LIPID PANEL
Cholesterol: 235 mg/dL — ABNORMAL HIGH (ref 0–200)
HDL: 75.3 mg/dL (ref >39.00)
LDL Cholesterol: 148 mg/dL — ABNORMAL HIGH (ref 0–99)
NonHDL: 159.26
Total CHOL/HDL Ratio: 3
Triglycerides: 58 mg/dL (ref 0.0–149.0)
VLDL: 11.6 mg/dL (ref 0.0–40.0)

## 2018-10-08 LAB — HEMOGLOBIN A1C: Hgb A1c MFr Bld: 6.6 % — ABNORMAL HIGH (ref 4.6–6.5)

## 2018-10-11 ENCOUNTER — Ambulatory Visit (INDEPENDENT_AMBULATORY_CARE_PROVIDER_SITE_OTHER): Payer: Medicare Other | Admitting: Endocrinology

## 2018-10-11 ENCOUNTER — Encounter: Payer: Self-pay | Admitting: Endocrinology

## 2018-10-11 ENCOUNTER — Other Ambulatory Visit: Payer: Self-pay

## 2018-10-11 DIAGNOSIS — E119 Type 2 diabetes mellitus without complications: Secondary | ICD-10-CM | POA: Diagnosis not present

## 2018-10-11 DIAGNOSIS — M259 Joint disorder, unspecified: Secondary | ICD-10-CM

## 2018-10-11 DIAGNOSIS — E7800 Pure hypercholesterolemia, unspecified: Secondary | ICD-10-CM | POA: Diagnosis not present

## 2018-10-11 LAB — VITAMIN D 25 HYDROXY (VIT D DEFICIENCY, FRACTURES): VITD: 52.27 ng/mL (ref 30.00–100.00)

## 2018-10-11 LAB — URIC ACID: Uric Acid, Serum: 4.3 mg/dL (ref 2.4–7.0)

## 2018-10-11 NOTE — Progress Notes (Signed)
Amanda [name] is an 73 y.o. female.             Reason for Appointment: Endocrinology follow-up   Initial diagnosis of diabetes:?  2007   History of Present Illness   Diagnosis: Type 2 DIABETES MELITUS     Previous history: She has had difficulty with blood sugar control initially because of limited tolerability with metformin and difficulty with weight loss. She did very well with starting Victoza which has helped her blood sugar control as well as some improvement in her obesity Her insurance company did not prefer Victoza and she was switched to Trulicity Subsequently her glipizide was stopped She was started on Invokana 100 mg daily in addition to her Trulicity 3.24 mg when her blood sugars were higher in 2017  Recent history:     Non-insulin hypoglycemic drugs:   Invokana 401 mg daily  Trulicity 0.27 mg weekly   Current blood sugar patterns, management and problems identified:   Her A1c is stable at 6.6  She was last seen in 6/20  Previously had difficulty with weight gain but now this appears to be slightly better  She does try to keep walking on her treadmill  Her blood sugars are lower than expected for her A1c  Previously had been checking blood sugars before lunch and now she is trying to do blood sugars after supper at night  Lab glucose was 109 fasting  No side effects from either Trulicity or Invokana          Side effects from medications: Diarrhea from metformin.  Nausea from 1.5 mg Trulicity         Monitors blood glucose:  less than Once a day.    Glucometer: One Touch ultra 2  Readings checked after dinner, range 101-128 AVERAGE 116  Physical activity: exercise: walking on treadmill 3-4 days/7 upto 60 min            Wt Readings from Last 3 Encounters:  10/11/18 178 lb 12.8 oz (81.1 kg)  10/02/18 176 lb (79.8 kg)  07/10/18 180 lb 3.2 oz (81.7 kg)    LABS:  Lab Results  Component Value Date   HGBA1C 6.6 (H) 10/08/2018   HGBA1C 6.6 (H)  07/05/2018   HGBA1C 7.3 (H) 02/15/2018   Lab Results  Component Value Date   MICROALBUR 0.8 07/05/2018   LDLCALC 148 (H) 10/08/2018   CREATININE 0.99 10/08/2018   Other active problems discussed today: See review of systems   Lab on 10/08/2018  Component Date Value Ref Range Status  . Cholesterol 10/08/2018 235* 0 - 200 mg/dL Final   ATP III Classification       Desirable:  < 200 mg/dL               Borderline High:  200 - 239 mg/dL          High:  > = 240 mg/dL  . Triglycerides 10/08/2018 58.0  0.0 - 149.0 mg/dL Final   Normal:  <150 mg/dLBorderline High:  150 - 199 mg/dL  . HDL 10/08/2018 75.30  >39.00 mg/dL Final  . VLDL 10/08/2018 11.6  0.0 - 40.0 mg/dL Final  . LDL Cholesterol 10/08/2018 148* 0 - 99 mg/dL Final  . Total CHOL/HDL Ratio 10/08/2018 3   Final                  Men          Women1/2 Average Risk     3.4  3.3Average Risk          5.0          4.42X Average Risk          9.6          7.13X Average Risk          15.0          11.0                      . NonHDL 10/08/2018 159.26   Final   NOTE:  Non-HDL goal should be 30 mg/dL higher than patient's LDL goal (i.e. LDL goal of < 70 mg/dL, would have non-HDL goal of < 100 mg/dL)  . Sodium 10/08/2018 139  135 - 145 mEq/L Final  . Potassium 10/08/2018 4.7  3.5 - 5.1 mEq/L Final  . Chloride 10/08/2018 106  96 - 112 mEq/L Final  . CO2 10/08/2018 26  19 - 32 mEq/L Final  . Glucose, Bld 10/08/2018 109* 70 - 99 mg/dL Final  . BUN 10/08/2018 28* 6 - 23 mg/dL Final  . Creatinine, Ser 10/08/2018 0.99  0.40 - 1.20 mg/dL Final  . Total Bilirubin 10/08/2018 0.5  0.2 - 1.2 mg/dL Final  . Alkaline Phosphatase 10/08/2018 56  39 - 117 U/L Final  . AST 10/08/2018 21  0 - 37 U/L Final  . ALT 10/08/2018 18  0 - 35 U/L Final  . Total Protein 10/08/2018 7.8  6.0 - 8.3 g/dL Final  . Albumin 10/08/2018 4.5  3.5 - 5.2 g/dL Final  . Calcium 10/08/2018 10.6* 8.4 - 10.5 mg/dL Final  . GFR 10/08/2018 66.47  >60.00 mL/min Final  . Hgb A1c  MFr Bld 10/08/2018 6.6* 4.6 - 6.5 % Final   Glycemic Control Guidelines for People with Diabetes:Non Diabetic:  <6%Goal of Therapy: <7%Additional Action Suggested:  >8%     Allergies as of 10/11/2018      Reactions   Statins Other (See Comments)   Muscle pain. Pt able to take Crestor but not everyday.   Meperidine Hcl Rash      Medication List       Accurate as of October 11, 2018 11:38 AM. If you have any questions, ask your nurse or doctor.        STOP taking these medications   aspirin EC 325 MG tablet Stopped by: [name], MD     TAKE these medications   anastrozole 1 MG tablet Commonly known as: ARIMIDEX Take 1 tablet (1 mg total) by mouth daily.   cloNIDine 0.1 mg/24hr patch Commonly known as: CATAPRES - Dosed in mg/24 hr Place 1 patch (0.1 mg total) onto the skin once a week.   HYDROcodone-acetaminophen 5-325 MG tablet Commonly known as: NORCO/VICODIN Take 1 tablet by mouth every 6 (six) hours as needed.   Invokana 300 MG Tabs tablet Generic drug: canagliflozin TAKE 1 TABLET BY MOUTH DAILY BEFORE THE FIRST MEAL OF THE DAY   olopatadine 0.1 % ophthalmic solution Commonly known as: PATANOL Instill 1 drop in left eye twice daily   ONE TOUCH ULTRA 2 w/Device Kit 1 each by Does not apply route daily. Use daily to check blood sugar.   ONE TOUCH ULTRA TEST test strip Generic drug: glucose blood USE TO TEST BLOOD SUGAR ONE TIME DAILY AS INSTRUCTED   Pitavastatin Calcium 4 MG Tabs Commonly known as: Livalo Take one tablet daily   Trulicity 8.24 MP/5.3IR Sopn Generic drug: Dulaglutide INJECT 0.75 MG  UNDER THE SKIN ONCE A WEEK   Vitamin D3 50 MCG (2000 UT) Tabs Take 6,000 Units by mouth daily.       Allergies:  Allergies  Allergen Reactions  . Statins Other (See Comments)    Muscle pain. Pt able to take Crestor but not everyday.  . Meperidine Hcl Rash    Past Medical History:  Diagnosis Date  . Arthritis   . Breast cancer ([place])   . Breast  cancer of upper-outer quadrant of right female breast ([place]) 03/24/2015  . Carpal tunnel syndrome   . Diabetes mellitus without complication ([place])    type 2  . GERD (gastroesophageal reflux disease)    Pt takes OTC when needed.  Marland Kitchen Hot flashes     Past Surgical History:  Procedure Laterality Date  . ABDOMINAL HYSTERECTOMY    . APPENDECTOMY    . COLONOSCOPY    . ESOPHAGOGASTRODUODENOSCOPY    . EYE SURGERY Bilateral    Cataracts removed  . JOINT REPLACEMENT    . RADIOACTIVE SEED GUIDED PARTIAL MASTECTOMY WITH AXILLARY SENTINEL LYMPH NODE BIOPSY Right 04/16/2015   Procedure: RADIOACTIVE SEED GUIDED PARTIAL MASTECTOMY WITH AXILLARY SENTINEL LYMPH NODE BIOPSY;  Surgeon: Alphonsa Overall, MD;  Location: [facility];  Service: General;  Laterality: Right;  . SHOULDER SURGERY Right   . TONSILLECTOMY    . TOTAL KNEE ARTHROPLASTY Right 07/18/2013   DR GRAVES  . TOTAL KNEE ARTHROPLASTY Right 07/18/2013   Procedure: TOTAL KNEE ARTHROPLASTY;  Surgeon: Alta Corning, MD;  Location: [facility];  Service: Orthopedics;  Laterality: Right;  . TOTAL KNEE ARTHROPLASTY Left 06/15/2017   Procedure: LEFT TOTAL KNEE ARTHROPLASTY;  Surgeon: Dorna Leitz, MD;  Location: [facility];  Service: Orthopedics;  Laterality: Left;    History reviewed. No pertinent family history.  Social History:  reports that she has never smoked. She has never used smokeless tobacco. She reports current alcohol use. She reports that she does not use drugs.  Review of Systems:  Lipids:  She was switched from Crestor to Livalo because of her difficulty tolerating this because of joint pains . She is taking Livalo 4 mg, now trying to take this every other day more regularly   Previously baseline LDL has been as high as 200 LDL slightly better than on her last measurement   Lab Results  Component Value Date   CHOL 235 (H) 10/08/2018   CHOL 260 (H) 02/15/2018   CHOL 216 (H) 05/16/2017   Lab Results  Component Value Date    HDL 75.30 10/08/2018   HDL 77.20 02/15/2018   HDL 66.60 05/16/2017   Lab Results  Component Value Date   LDLCALC 148 (H) 10/08/2018   LDLCALC 168 (H) 02/15/2018   LDLCALC 139 (H) 05/16/2017   Lab Results  Component Value Date   TRIG 58.0 10/08/2018   TRIG 70.0 02/15/2018   TRIG 53.0 05/16/2017   Lab Results  Component Value Date   CHOLHDL 3 10/08/2018   CHOLHDL 3 02/15/2018   CHOLHDL 3 05/16/2017   Lab Results  Component Value Date   LDLDIRECT 120.1 09/30/2013   LDLDIRECT 115.5 03/24/2013   LDLDIRECT 204.9 11/19/2012     She takes Catapres patch prescribed by her oncologist mostly for hot flashes and not for hypertension  Foot exam done in 8/19, normal exam  Eye exam 10/19, no abnormalities found  She is seen by [facility], no reports available  She takes vitamin D 6000 units daily as advised by her  PCP However no recent records are available  Bone density was normal in 2016 done by PCP  HYPERCALCEMIA: She appears to have persistently high or high normal calcium levels since 6/19 Not on any calcium supplements, HCTZ  Lab Results  Component Value Date   CALCIUM 10.6 (H) 10/08/2018   CALCIUM 10.1 07/05/2018   CALCIUM 10.7 (H) 02/15/2018   CALCIUM 10.5 09/14/2017   CALCIUM 10.7 (H) 07/17/2017   CALCIUM 9.4 06/16/2017   CALCIUM 9.9 06/07/2017      Examination:     BP (!) 144/68 (BP Location: Left Arm, Patient Position: Sitting, Cuff Size: Normal)   Pulse (!) 55   Ht 5' 2.5" (1.588 m)   Wt 178 lb 12.8 oz (81.1 kg)   SpO2 98%   BMI 32.18 kg/m   Body mass index is 32.18 kg/m.     ASSESSMENT/ PLAN:  Diabetes type 2 with mild obesity  See history of present illness for discussion of current diabetes management, blood sugar patterns and problems identified  Her A1c has leveled off at 6.6  However her blood sugars are mostly normal when checked either in the lab or after supper at home She has lost a couple of pounds and reversing her trend of  weight gain She is doing well with Invokana and Trulicity and renal function is stable  To continue the same and check blood sugars by rotation at different times of the day  HYPERCHOLESTEROLEMIA:  Fair control and she can continue Livalo every other day, may consider adding Zetia, however patient is reluctant to add more medications  HYPERCALCEMIA: This appears to be mild and indolent since 07/2017 She has not been told about this by her PCP Likely has primary hyperparathyroidism Has not had any acute rise in her calcium and no evidence of active cancer at this time We will check PTH Discussed implications of hyperparathyroidism and how this happens as well as possible long-term effects  May need to repeat bone density  Total visit time for evaluation and management of multiple problems and counseling =25 minutes  Follow-up in 3 months  There are no Patient Instructions on file for this visit.   [name] 10/11/2018, 11:38 AM

## 2018-10-12 LAB — PARATHYROID HORMONE, INTACT (NO CA): PTH: 29 pg/mL (ref 15–65)

## 2018-10-16 NOTE — Progress Notes (Signed)
Please call to let patient know that the vitamin D, parathyroid hormone and uric acid results are normal and no further action needed, will continue to follow calcium

## 2018-11-25 ENCOUNTER — Ambulatory Visit (INDEPENDENT_AMBULATORY_CARE_PROVIDER_SITE_OTHER): Payer: Medicare Other

## 2018-11-25 ENCOUNTER — Encounter: Payer: Self-pay | Admitting: Endocrinology

## 2018-11-25 ENCOUNTER — Other Ambulatory Visit: Payer: Self-pay

## 2018-11-25 DIAGNOSIS — Z23 Encounter for immunization: Secondary | ICD-10-CM | POA: Diagnosis not present

## 2018-12-14 ENCOUNTER — Other Ambulatory Visit: Payer: Self-pay | Admitting: Endocrinology

## 2018-12-16 ENCOUNTER — Telehealth: Payer: Self-pay

## 2018-12-16 NOTE — Telephone Encounter (Signed)
PA initiated via [email or web address] for Invokana.  [name] Key: [id number] - Rx #: [id number] Need help? Call us at [phone] Outcome Additional Information Required Drug is covered by current benefit plan. No further PA activity needed Drug Invokana 300MG  tablets Form [employer] Electronic PA Form Original Claim Info [id number]

## 2018-12-18 ENCOUNTER — Other Ambulatory Visit: Payer: Self-pay

## 2018-12-18 ENCOUNTER — Telehealth: Payer: Self-pay | Admitting: Endocrinology

## 2018-12-18 MED ORDER — GLUCOSE BLOOD VI STRP: ORAL_STRIP | 2 refills | Status: DC

## 2018-12-18 NOTE — Telephone Encounter (Signed)
MEDICATION: ONE TOUCH ULTRA TEST test strip  PHARMACY:   [facility] [phone] - , [address] AT [address] [phone] (Phone) [phone] (Fax)    IS THIS A 90 DAY SUPPLY : Not sure  IS PATIENT OUT OF MEDICATION:  No  IF NOT; HOW MUCH IS LEFT: a few  LAST APPOINTMENT DATE: @11 /10/2018  NEXT APPOINTMENT DATE:@12 /31/2020  DO WE HAVE YOUR PERMISSION TO LEAVE A DETAILED MESSAGE: Yes  OTHER COMMENTS:    **Let patient know to contact pharmacy at the end of the day to make sure medication is ready. **  ** Please notify patient to allow 48-72 hours to process**  **Encourage patient to contact the pharmacy for refills or they can request refills through [facility]**

## 2018-12-18 NOTE — Telephone Encounter (Signed)
Rx sent 

## 2018-12-31 NOTE — Telephone Encounter (Signed)
PA had to be resubmitted due to the clinical questions being unavailable.   [name] Key: Elder Negus help? Call us at [phone] Outcome Additional Information Required Drug is covered by current benefit plan. No further PA activity needed Drug Invokana 300MG  OR TABS Form [employer] Electronic PA Form

## 2018-12-31 NOTE — Telephone Encounter (Signed)
Per [email or web address].[email or web address], PA is not needed for Invokana.

## 2019-01-06 ENCOUNTER — Other Ambulatory Visit: Payer: Self-pay

## 2019-01-06 ENCOUNTER — Telehealth: Payer: Self-pay

## 2019-01-06 MED ORDER — LIVALO 4 MG PO TABS: ORAL_TABLET | ORAL | 2 refills | Status: DC

## 2019-01-06 NOTE — Telephone Encounter (Signed)
Rx sent 

## 2019-01-06 NOTE — Telephone Encounter (Signed)
MEDICATION: Pitavastatin Calcium (LIVALO) 4 MG TABS  PHARMACY:  [facility] [phone] - [place], [place] [address] AT [address]  IS THIS A 90 DAY SUPPLY :   IS PATIENT OUT OF MEDICATION:   IF NOT; HOW MUCH IS LEFT:   LAST APPOINTMENT DATE: @11 /12/2018  NEXT APPOINTMENT DATE:@12 /31/2020  DO WE HAVE YOUR PERMISSION TO LEAVE A DETAILED MESSAGE:  OTHER COMMENTS: patient needs nurse to call and verify for the pharmacfy b/c patiemt says Dr has prescribed 4mg  and 1 mg and they need to know what to give patient    **Let patient know to contact pharmacy at the end of the day to make sure medication is ready. **  ** Please notify patient to allow 48-72 hours to process**  **Encourage patient to contact the pharmacy for refills or they can request refills through [facility]**

## 2019-01-06 NOTE — Telephone Encounter (Signed)
PA initiated and approved via [email or web address] for Livalo 4mg  tablets.   [name] Key: [place] Expose - PA Case ID: [id number] - Rx #: [id number] Need help? Call us at [phone] Outcome Approvedtoday CaseId:[id number];Status:Approved;Review Type:Prior Auth;Coverage Start Date:12/07/2018;Coverage End Date:01/06/2020; Drug Livalo 1MG  tablets Form [employer] Electronic PA Form Original Claim Info 75

## 2019-02-06 ENCOUNTER — Other Ambulatory Visit: Payer: Medicare Other

## 2019-02-06 ENCOUNTER — Other Ambulatory Visit: Payer: Self-pay | Admitting: Endocrinology

## 2019-02-10 ENCOUNTER — Ambulatory Visit: Payer: Medicare Other | Admitting: Endocrinology

## 2019-03-05 ENCOUNTER — Other Ambulatory Visit: Payer: Self-pay

## 2019-03-05 ENCOUNTER — Other Ambulatory Visit (INDEPENDENT_AMBULATORY_CARE_PROVIDER_SITE_OTHER): Payer: Medicare Other

## 2019-03-05 DIAGNOSIS — E119 Type 2 diabetes mellitus without complications: Secondary | ICD-10-CM

## 2019-03-05 LAB — COMPREHENSIVE METABOLIC PANEL
ALT: 22 U/L (ref 0–35)
AST: 23 U/L (ref 0–37)
Albumin: 4.2 g/dL (ref 3.5–5.2)
Alkaline Phosphatase: 56 U/L (ref 39–117)
BUN: 18 mg/dL (ref 6–23)
CO2: 27 mEq/L (ref 19–32)
Calcium: 10.2 mg/dL (ref 8.4–10.5)
Chloride: 106 mEq/L (ref 96–112)
Creatinine, Ser: 0.83 mg/dL (ref 0.40–1.20)
GFR: 81.37 mL/min (ref >60.00)
Glucose, Bld: 99 mg/dL (ref 70–99)
Potassium: 4.3 mEq/L (ref 3.5–5.1)
Sodium: 139 mEq/L (ref 135–145)
Total Bilirubin: 0.6 mg/dL (ref 0.2–1.2)
Total Protein: 7 g/dL (ref 6.0–8.3)

## 2019-03-05 LAB — HEMOGLOBIN A1C: Hgb A1c MFr Bld: 6.6 % — ABNORMAL HIGH (ref 4.6–6.5)

## 2019-03-06 ENCOUNTER — Other Ambulatory Visit: Payer: Medicare Other

## 2019-03-11 ENCOUNTER — Other Ambulatory Visit: Payer: Self-pay

## 2019-03-11 ENCOUNTER — Ambulatory Visit (INDEPENDENT_AMBULATORY_CARE_PROVIDER_SITE_OTHER): Payer: Medicare Other | Admitting: Endocrinology

## 2019-03-11 ENCOUNTER — Encounter: Payer: Self-pay | Admitting: Endocrinology

## 2019-03-11 VITALS — BP 120/70 | HR 85 | Ht 63.0 in | Wt 182.4 lb

## 2019-03-11 DIAGNOSIS — E7800 Pure hypercholesterolemia, unspecified: Secondary | ICD-10-CM | POA: Diagnosis not present

## 2019-03-11 DIAGNOSIS — E119 Type 2 diabetes mellitus without complications: Secondary | ICD-10-CM

## 2019-03-11 NOTE — Patient Instructions (Signed)
You may register to get the vaccine at either of the 2 options:  [place] health department website   [email or web address]  Telephone number: [phone], Option 2   Also can register at the [place] website, wait listing may be an option  [email or web address] or call [phone]  

## 2019-03-11 NOTE — Progress Notes (Signed)
Amanda [name] is an 74 y.o. female.             Reason for Appointment: Endocrinology follow-up   Initial diagnosis of diabetes:?  2007   History of Present Illness   Diagnosis: Type 2 DIABETES MELITUS     Previous history: She has had difficulty with blood sugar control initially because of limited tolerability with metformin and difficulty with weight loss. She did very well with starting Victoza which has helped her blood sugar control as well as some improvement in her obesity Her insurance company did not prefer Victoza and she was switched to Trulicity Subsequently her glipizide was stopped She was started on Invokana 100 mg daily in addition to her Trulicity 7.41 mg when her blood sugars were higher in 2017  Recent history:     Non-insulin hypoglycemic drugs:   Invokana 638 mg daily  Trulicity 4.53 mg weekly   Current blood sugar patterns, management and problems identified:   Her A1c is stable at 6.6  She was last seen in 10/2018  She appears to be again gaining back some weight  She thinks this is from somewhat inconsistent diet even though she is trying to walk on a treadmill 5 days a week  She has had no difficulty with her medications or compliance with either Trulicity or Invokana  She has checked blood sugars mostly midday and these are fairly good with highest reading only 148  Lab glucose late morning was 99          Side effects from medications: Diarrhea from metformin.  Nausea from 1.5 mg Trulicity         Monitors blood glucose:  less than Once a day.    Glucometer: One Touch ultra 2  Blood sugar range 107-148 with most readings midday and early afternoon and some in the evening  AVERAGE 128  Physical activity: exercise: walking on treadmill 3-4 days/7 upto 60 min            Wt Readings from Last 3 Encounters:  03/11/19 182 lb 6.4 oz (82.7 kg)  10/11/18 178 lb 12.8 oz (81.1 kg)  10/02/18 176 lb (79.8 kg)    LABS:  Lab Results  Component  Value Date   HGBA1C 6.6 (H) 03/05/2019   HGBA1C 6.6 (H) 10/08/2018   HGBA1C 6.6 (H) 07/05/2018   Lab Results  Component Value Date   MICROALBUR 0.8 07/05/2018   LDLCALC 148 (H) 10/08/2018   CREATININE 0.83 03/05/2019   Other active problems discussed today: See review of systems   Lab on 03/05/2019  Component Date Value Ref Range Status  . Sodium 03/05/2019 139  135 - 145 mEq/L Final  . Potassium 03/05/2019 4.3  3.5 - 5.1 mEq/L Final  . Chloride 03/05/2019 106  96 - 112 mEq/L Final  . CO2 03/05/2019 27  19 - 32 mEq/L Final  . Glucose, Bld 03/05/2019 99  70 - 99 mg/dL Final  . BUN 03/05/2019 18  6 - 23 mg/dL Final  . Creatinine, Ser 03/05/2019 0.83  0.40 - 1.20 mg/dL Final  . Total Bilirubin 03/05/2019 0.6  0.2 - 1.2 mg/dL Final  . Alkaline Phosphatase 03/05/2019 56  39 - 117 U/L Final  . AST 03/05/2019 23  0 - 37 U/L Final  . ALT 03/05/2019 22  0 - 35 U/L Final  . Total Protein 03/05/2019 7.0  6.0 - 8.3 g/dL Final  . Albumin 03/05/2019 4.2  3.5 - 5.2 g/dL Final  .  GFR 03/05/2019 81.37  >60.00 mL/min Final  . Calcium 03/05/2019 10.2  8.4 - 10.5 mg/dL Final  . Hgb A1c MFr Bld 03/05/2019 6.6* 4.6 - 6.5 % Final   Glycemic Control Guidelines for People with Diabetes:Non Diabetic:  <6%Goal of Therapy: <7%Additional Action Suggested:  >8%     Allergies as of 03/11/2019      Reactions   Statins Other (See Comments)   Muscle pain. Pt able to take Crestor but not everyday.   Meperidine Hcl Rash      Medication List       Accurate as of March 11, 2019 11:59 PM. If you have any questions, ask your nurse or doctor.        anastrozole 1 MG tablet Commonly known as: ARIMIDEX Take 1 tablet (1 mg total) by mouth daily.   cloNIDine 0.1 mg/24hr patch Commonly known as: CATAPRES - Dosed in mg/24 hr Place 1 patch (0.1 mg total) onto the skin once a week.   glucose blood test strip Use Onetouch Ultra test strips as instructed to check blood sugar once daily.    HYDROcodone-acetaminophen 5-325 MG tablet Commonly known as: NORCO/VICODIN Take 1 tablet by mouth every 6 (six) hours as needed.   Invokana 300 MG Tabs tablet Generic drug: canagliflozin TAKE 1 TABLET BY MOUTH DAILY BEFORE THE FIRST MEAL OF THE DAY   Livalo 4 MG Tabs Generic drug: Pitavastatin Calcium Take one tablet daily   olopatadine 0.1 % ophthalmic solution Commonly known as: PATANOL Instill 1 drop in left eye twice daily   ONE TOUCH ULTRA 2 w/Device Kit 1 each by Does not apply route daily. Use daily to check blood sugar.   Trulicity 2.83 TD/1.7OH Sopn Generic drug: Dulaglutide INJECT 0.75 MG UNDER THE SKIN ONCE A WEEK   Vitamin D3 50 MCG (2000 UT) Tabs Take 6,000 Units by mouth daily.       Allergies:  Allergies  Allergen Reactions  . Statins Other (See Comments)    Muscle pain. Pt able to take Crestor but not everyday.  . Meperidine Hcl Rash    Past Medical History:  Diagnosis Date  . Arthritis   . Breast cancer ([place])   . Breast cancer of upper-outer quadrant of right female breast ([place]) 03/24/2015  . Carpal tunnel syndrome   . Diabetes mellitus without complication ([place])    type 2  . GERD (gastroesophageal reflux disease)    Pt takes OTC when needed.  Marland Kitchen Hot flashes     Past Surgical History:  Procedure Laterality Date  . ABDOMINAL HYSTERECTOMY    . APPENDECTOMY    . COLONOSCOPY    . ESOPHAGOGASTRODUODENOSCOPY    . EYE SURGERY Bilateral    Cataracts removed  . JOINT REPLACEMENT    . RADIOACTIVE SEED GUIDED PARTIAL MASTECTOMY WITH AXILLARY SENTINEL LYMPH NODE BIOPSY Right 04/16/2015   Procedure: RADIOACTIVE SEED GUIDED PARTIAL MASTECTOMY WITH AXILLARY SENTINEL LYMPH NODE BIOPSY;  Surgeon: Alphonsa Overall, MD;  Location: [facility];  Service: General;  Laterality: Right;  . SHOULDER SURGERY Right   . TONSILLECTOMY    . TOTAL KNEE ARTHROPLASTY Right 07/18/2013   DR GRAVES  . TOTAL KNEE ARTHROPLASTY Right 07/18/2013   Procedure: TOTAL  KNEE ARTHROPLASTY;  Surgeon: Alta Corning, MD;  Location: [facility];  Service: Orthopedics;  Laterality: Right;  . TOTAL KNEE ARTHROPLASTY Left 06/15/2017   Procedure: LEFT TOTAL KNEE ARTHROPLASTY;  Surgeon: Dorna Leitz, MD;  Location: [facility];  Service: Orthopedics;  Laterality: Left;  No family history on file.  Social History:  reports that she has never smoked. She has never used smokeless tobacco. She reports current alcohol use. She reports that she does not use drugs.  Review of Systems:  Lipids:  She was switched from Crestor to Livalo because of her difficulty tolerating this because of joint pains . She is taking Livalo 4 mg, now trying to take this every day more regularly for the last month She tries to take Tylenol if she has any muscle or joint pains   Previously baseline LDL has been as high as 200 LDL slightly better in 9/20 but still above target   Lab Results  Component Value Date   CHOL 235 (H) 10/08/2018   CHOL 260 (H) 02/15/2018   CHOL 216 (H) 05/16/2017   Lab Results  Component Value Date   HDL 75.30 10/08/2018   HDL 77.20 02/15/2018   HDL 66.60 05/16/2017   Lab Results  Component Value Date   LDLCALC 148 (H) 10/08/2018   LDLCALC 168 (H) 02/15/2018   LDLCALC 139 (H) 05/16/2017   Lab Results  Component Value Date   TRIG 58.0 10/08/2018   TRIG 70.0 02/15/2018   TRIG 53.0 05/16/2017   Lab Results  Component Value Date   CHOLHDL 3 10/08/2018   CHOLHDL 3 02/15/2018   CHOLHDL 3 05/16/2017   Lab Results  Component Value Date   LDLDIRECT 120.1 09/30/2013   LDLDIRECT 115.5 03/24/2013   LDLDIRECT 204.9 11/19/2012     She takes Catapres patch prescribed by her oncologist mostly for hot flashes and not for hypertension  Foot exam done in 8/19, normal exam  Eye exam 10/19, no abnormalities found  She is seen by [facility], no reports available  She takes vitamin D 6000 units daily as advised by her PCP However no recent records are available   Bone density was normal in 2016 done by PCP  HYPERCALCEMIA: She appears to have persistently high or high normal calcium levels since 6/19 Not on any HCTZ  Lab Results  Component Value Date   CALCIUM 10.2 03/05/2019   CALCIUM 10.6 (H) 10/08/2018   CALCIUM 10.1 07/05/2018   CALCIUM 10.7 (H) 02/15/2018   CALCIUM 10.5 09/14/2017   CALCIUM 10.7 (H) 07/17/2017   CALCIUM 9.4 06/16/2017   Lab Results  Component Value Date   PTH 29 10/11/2018   CALCIUM 10.2 03/05/2019   CAION 1.09 (L) 01/10/2009      Examination:     BP 120/70   Pulse 85   Ht '5\' 3"'  (1.6 m)   Wt 182 lb 6.4 oz (82.7 kg)   SpO2 98%   BMI 32.31 kg/m   Body mass index is 32.31 kg/m.     ASSESSMENT/ PLAN:  Diabetes type 2 with mild obesity  See history of present illness for discussion of current diabetes management, blood sugar patterns and problems identified  Her A1c has leveled off at 6.6  Although her sugars do not look any higher her weight has gone up likely to be from inconsistent diet over the last couple of months  However her blood sugars are fairly good at home although checking mostly midday before or after lunch She thinks she can do better with her diet She will continue exercise also Reminded her to check blood sugars at different times of the day including after dinner   HYPERCHOLESTEROLEMIA:  She has taken Livalo daily and will plan to check her lipids on the next visit She  thinks she can continue this daily and take Tylenol for any pains  HYPERCALCEMIA: This appears to be mild and indolent since 07/2017  Likely has primary hyperparathyroidism even with normal PTH Calcium is upper normal now and will continue to monitor periodically  Also recommend repeat bone density, previously checked by PCP  Given her information on where to register for the Covid vaccine appointment  Follow-up in 4 months  Patient Instructions  You may register to get the vaccine at either of the 2  options:  [place] website   [email or web address]  Telephone number: [phone], Option 2   Also can register at the [place] website, wait listing may be an option  [email or web address] or call [phone]      [name] 03/12/2019, 10:54 AM

## 2019-03-12 ENCOUNTER — Encounter: Payer: Self-pay | Admitting: Endocrinology

## 2019-03-13 ENCOUNTER — Other Ambulatory Visit: Payer: Self-pay | Admitting: Oncology

## 2019-04-04 ENCOUNTER — Ambulatory Visit: Payer: Medicare Other | Attending: Internal Medicine

## 2019-04-04 DIAGNOSIS — Z23 Encounter for immunization: Secondary | ICD-10-CM | POA: Insufficient documentation

## 2019-04-04 NOTE — Progress Notes (Signed)
   Covid-19 Vaccination Clinic  Name:  Amanda [name]    MRN: [id number] DOB: [birth year]  04/04/2019  Amanda [name] was observed post Covid-19 immunization for 15 minutes without incidence. She was provided with Vaccine Information Sheet and instruction to access the V-Safe system.   Amanda [name] was instructed to call 911 with any severe reactions post vaccine: Marland Kitchen Difficulty breathing  . Swelling of your face and throat  . A fast heartbeat  . A bad rash all over your body  . Dizziness and weakness    Immunizations Administered    Name Date Dose VIS Date Route   Pfizer COVID-19 Vaccine 04/04/2019 12:24 PM 0.3 mL 01/17/2019 Intramuscular   Manufacturer: [place]   Lot: [id number]   [place]: [id number]

## 2019-04-06 ENCOUNTER — Other Ambulatory Visit: Payer: Self-pay | Admitting: Oncology

## 2019-04-08 ENCOUNTER — Encounter: Payer: Self-pay | Admitting: Endocrinology

## 2019-04-29 ENCOUNTER — Ambulatory Visit: Payer: Medicare Other | Attending: Internal Medicine

## 2019-04-29 DIAGNOSIS — Z23 Encounter for immunization: Secondary | ICD-10-CM

## 2019-04-29 NOTE — Progress Notes (Signed)
   Covid-19 Vaccination Clinic  Name:  Amanda [name]    MRN: [id number] DOB: Mar 27, 1945  04/29/2019  [name] was observed post Covid-19 immunization for 15 minutes without incident. She was provided with Vaccine Information Sheet and instruction to access the V-Safe system.   [name] was instructed to call 911 with any severe reactions post vaccine: Marland Kitchen Difficulty breathing  . Swelling of face and throat  . A fast heartbeat  . A bad rash all over body  . Dizziness and weakness   Immunizations Administered    Name Date Dose VIS Date Route   Pfizer COVID-19 Vaccine 04/29/2019  2:53 PM 0.3 mL 01/17/2019 Intramuscular   Manufacturer: [place]   Lot: [id number]   [place]: [id number]

## 2019-05-08 ENCOUNTER — Other Ambulatory Visit: Payer: Self-pay

## 2019-05-08 MED ORDER — CANAGLIFLOZIN 300 MG PO TABS: ORAL_TABLET | ORAL | 0 refills | Status: DC

## 2019-06-09 ENCOUNTER — Other Ambulatory Visit: Payer: Self-pay

## 2019-06-09 MED ORDER — LIVALO 4 MG PO TABS: ORAL_TABLET | ORAL | 2 refills | Status: DC

## 2019-06-19 ENCOUNTER — Encounter: Payer: Self-pay | Admitting: Oncology

## 2019-07-21 ENCOUNTER — Other Ambulatory Visit: Payer: Self-pay | Admitting: Oncology

## 2019-07-21 DIAGNOSIS — C50411 Malignant neoplasm of upper-outer quadrant of right female breast: Secondary | ICD-10-CM

## 2019-07-21 NOTE — Progress Notes (Signed)
Amanda [name]  [phone] [phone]     ID: Amanda [name] DOB: 12/22/1945  MR#: [id number]  IRC#:[id number]  Patient Care Team: Antony Contras, MD as PCP - General (Family Medicine) Sylvan Cheese, NP as Nurse Practitioner (Hematology and Oncology) Dashawn Golda, Virgie Dad, MD as Consulting Physician (Oncology) Thea Silversmith, MD as Consulting Physician (Radiation Oncology) Alphonsa Overall, MD as Consulting Physician (General Surgery) [name], MD as Consulting Physician (Endocrinology) GYN: OTHER MD:  CHIEF COMPLAINT: estrogen receptor positive breast cancer  CURRENT TREATMENT: anastrozole   INTERVAL HISTORY:  Amanda [name] returns today for follow-up of her estrogen receptor positive breast cancer  She continues on anastrozole. She is wearing a clonidine patch to help with her hot flashes.  Nevertheless she is having some awful nighttime hot flashes which do not let her sleep even though she has a fan on all the time.  She did try gabapentin in the past but it did not work for her.  Since her last visit, she underwent bilateral diagnostic mammography with tomography at [facility] on 06/19/2019 showing: breast density category B; no evidence of malignancy in either breast.    REVIEW OF SYSTEMS: Amanda [name] does have daytime hot flashes also which do not bother her.  For exercise she gets on her treadmill 1 hour a day.  She watches at 1 hour TV program and actually enjoys doing it.  She said both Pfizer vaccine doses with no complications.  A detailed review of systems today was otherwise stable   BREAST CANCER HISTORY:  from the original intake note:  Amanda [name] had routine screening mammography at [facility] 03/10/2015. The breast density was category B. There was a new cluster of nodules in the right breast at the 11:00 position measuring approximately 5 mm. Ultrasonography 03/15/2014 confirmed a 1.1 center meter oval mass in the right breast at the 10:00 position 6  cm from the nipple. There was a benign 0.8 cm simple cyst immediately adjacent to the mass. The right axilla was sonographically benign.  Biopsy of the right breast mass in question 03/22/2015 showed ([id number]) invasive ductal carcinoma, grade 1, estrogen receptor 95% positive, progesterone receptor 80% positive, both with strong staining intensity, with an MIB-1 of 15%, and no HER-2 amplification, the signals ratio being 1.58 and the number per cell 3.00.  Her subsequent history is as detailed below   PAST MEDICAL HISTORY: Past Medical History:  Diagnosis Date   Arthritis    Breast cancer ([facility])    Breast cancer of upper-outer quadrant of right female breast ([place]) 03/24/2015   Carpal tunnel syndrome    Diabetes mellitus without complication ([facility])    type 2   GERD (gastroesophageal reflux disease)    Pt takes OTC when needed.   Hot flashes     PAST SURGICAL HISTORY: Past Surgical History:  Procedure Laterality Date   ABDOMINAL HYSTERECTOMY     APPENDECTOMY     COLONOSCOPY     ESOPHAGOGASTRODUODENOSCOPY     EYE SURGERY Bilateral    Cataracts removed   JOINT REPLACEMENT     RADIOACTIVE SEED GUIDED PARTIAL MASTECTOMY WITH AXILLARY SENTINEL LYMPH NODE BIOPSY Right 04/16/2015   Procedure: RADIOACTIVE SEED GUIDED PARTIAL MASTECTOMY WITH AXILLARY SENTINEL LYMPH NODE BIOPSY;  Surgeon: Alphonsa Overall, MD;  Location: [place];  Service: General;  Laterality: Right;   SHOULDER SURGERY Right    TONSILLECTOMY     TOTAL KNEE ARTHROPLASTY Right 07/18/2013   DR GRAVES   TOTAL KNEE ARTHROPLASTY Right  07/18/2013   Procedure: TOTAL KNEE ARTHROPLASTY;  Surgeon: Alta Corning, MD;  Location: [name];  Service: Orthopedics;  Laterality: Right;   TOTAL KNEE ARTHROPLASTY Left 06/15/2017   Procedure: LEFT TOTAL KNEE ARTHROPLASTY;  Surgeon: Dorna Leitz, MD;  Location: [facility];  Service: Orthopedics;  Laterality: Left;    FAMILY HISTORY No family history on file. The  patient's father died in his 56s from reasons unclear to the patient. Her mother died from heart disease at the age of 82. The patient had one brother, 5 sisters. There is no history of breast or ovarian cancer in the family to her knowledge.   GYNECOLOGIC HISTORY:  No LMP recorded. Patient has had a hysterectomy. Menarche age 67, first live birth age 28. She is GX P2. She underwent hysterectomy with bilateral salpingo-oophorectomy in her 66s. 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  She works part-time doing Chief Strategy Officer.  She is divorced and lives alone, with no pets. Son [name] lives in [place] where he manages an auto zone. Daughter [name] lives in [place]. She works for [employer]. The patient has 4 grandchildren. She is a Psychologist, forensic.    ADVANCED DIRECTIVES: Not in place   HEALTH MAINTENANCE: Social History   Tobacco Use   Smoking status: Never Smoker   Smokeless tobacco: Never Used  Substance Use Topics   Alcohol use: Yes    Comment: socially, occ weekends   Drug use: No     Colonoscopy: 2016/ [facility]  PAP: Status post hysterectomy  Bone density: 2016/ normal/ [facility]  Lipid panel:  Allergies  Allergen Reactions   Statins Other (See Comments)    Muscle pain. Pt able to take Crestor but not everyday.   Meperidine Hcl Rash    Current Outpatient Medications  Medication Sig Dispense Refill   anastrozole (ARIMIDEX) 1 MG tablet Take 1 tablet (1 mg total) by mouth daily. 90 tablet 4   Blood Glucose Monitoring Suppl (ONE TOUCH ULTRA 2) w/Device KIT 1 each by Does not apply route daily. Use daily to check blood sugar. 1 each 0   canagliflozin (INVOKANA) 300 MG TABS tablet TAKE 1 TABLET BY MOUTH DAILY BEFORE THE FIRST MEAL OF THE DAY 90 tablet 0   Cholecalciferol (VITAMIN D3) 2000 units TABS Take 6,000 Units by mouth daily.      cloNIDine (CATAPRES - DOSED IN  MG/24 HR) 0.1 mg/24hr patch APPLY 1 PATCH EXTERNALLY TO THE SKIN EVERY WEEK 4 patch 3   glucose blood test strip Use Onetouch Ultra test strips as instructed to check blood sugar once daily. 100 each 2   olopatadine (PATANOL) 0.1 % ophthalmic solution Instill 1 drop in left eye twice daily  11   Pitavastatin Calcium (LIVALO) 4 MG TABS Take one tablet daily 30 tablet 2   TRULICITY 9.24 QA/8.3MH SOPN INJECT 0.75 MG UNDER THE SKIN ONCE A WEEK 6 mL 3   venlafaxine (EFFEXOR) 37.5 MG tablet Take 1 tablet (37.5 mg total) by mouth 2 (two) times daily. 90 tablet 4   No current facility-administered medications for this visit.    OBJECTIVE:  African-American woman in no acute distress Vitals:   07/22/19 0908  BP: (!) 162/62  Pulse: (!) 52  Resp: 18  Temp: 97.8 F (36.6 C)  SpO2: 100%     Body mass index is 31.3 kg/m.    ECOG FS:1 - Symptomatic but  completely ambulatory  Sclerae unicteric, EOMs intact Wearing a mask No cervical or supraclavicular adenopathy Lungs no rales or rhonchi Heart regular rate and rhythm Abd soft, nontender, positive bowel sounds MSK no focal spinal tenderness, no upper extremity lymphedema Neuro: nonfocal, well oriented, appropriate affect Breasts: The right breast is status post lumpectomy and radiation.  There is minimal hyperpigmentation.  There is no evidence of local recurrence.  The left breast is benign.  Both axillae are benign.   LAB RESULTS:  CMP     Component Value Date/Time   NA 139 03/05/2019 1132   NA 144 05/09/2016 1108   K 4.3 03/05/2019 1132   K 4.9 05/09/2016 1108   CL 106 03/05/2019 1132   CO2 27 03/05/2019 1132   CO2 27 05/09/2016 1108   GLUCOSE 99 03/05/2019 1132   GLUCOSE 124 05/09/2016 1108   BUN 18 03/05/2019 1132   BUN 21.0 05/09/2016 1108   CREATININE 0.83 03/05/2019 1132   CREATININE 1.0 05/09/2016 1108   CALCIUM 10.2 03/05/2019 1132   CALCIUM 10.4 05/09/2016 1108   PROT 7.0 03/05/2019 1132   PROT 7.1 05/09/2016 1108    ALBUMIN 4.2 03/05/2019 1132   ALBUMIN 3.6 05/09/2016 1108   AST 23 03/05/2019 1132   AST 18 05/09/2016 1108   ALT 22 03/05/2019 1132   ALT 20 05/09/2016 1108   ALKPHOS 56 03/05/2019 1132   ALKPHOS 78 05/09/2016 1108   BILITOT 0.6 03/05/2019 1132   BILITOT 0.61 05/09/2016 1108   GFRNONAA 60 (L) 07/17/2017 0821   GFRAA >60 07/17/2017 0821    INo results found for: SPEP, UPEP  Lab Results  Component Value Date   WBC 10.7 (H) 07/22/2019   NEUTROABS 5.6 07/22/2019   HGB 14.4 07/22/2019   HCT 43.9 07/22/2019   MCV 96.5 07/22/2019   PLT 339 07/22/2019      Chemistry      Component Value Date/Time   NA 139 03/05/2019 1132   NA 144 05/09/2016 1108   K 4.3 03/05/2019 1132   K 4.9 05/09/2016 1108   CL 106 03/05/2019 1132   CO2 27 03/05/2019 1132   CO2 27 05/09/2016 1108   BUN 18 03/05/2019 1132   BUN 21.0 05/09/2016 1108   CREATININE 0.83 03/05/2019 1132   CREATININE 1.0 05/09/2016 1108      Component Value Date/Time   CALCIUM 10.2 03/05/2019 1132   CALCIUM 10.4 05/09/2016 1108   ALKPHOS 56 03/05/2019 1132   ALKPHOS 78 05/09/2016 1108   AST 23 03/05/2019 1132   AST 18 05/09/2016 1108   ALT 22 03/05/2019 1132   ALT 20 05/09/2016 1108   BILITOT 0.6 03/05/2019 1132   BILITOT 0.61 05/09/2016 1108       No results found for: LABCA2  No components found for: LABCA125  No results for input(s): INR in the last 168 hours.  Urinalysis    Component Value Date/Time   COLORURINE STRAW (A) 06/07/2017 0838   APPEARANCEUR CLEAR 06/07/2017 0838   LABSPEC 1.015 06/07/2017 0838   PHURINE 5.0 06/07/2017 0838   GLUCOSEU >=500 (A) 06/07/2017 0838   GLUCOSEU NEGATIVE 04/29/2015 1010   HGBUR NEGATIVE 06/07/2017 0838   BILIRUBINUR NEGATIVE 06/07/2017 0838   KETONESUR NEGATIVE 06/07/2017 0838   PROTEINUR NEGATIVE 06/07/2017 0838   UROBILINOGEN 0.2 04/29/2015 1010   NITRITE NEGATIVE 06/07/2017 0838   LEUKOCYTESUR NEGATIVE 06/07/2017 0838    ELIGIBLE FOR AVAILABLE RESEARCH  PROTOCOL: no   STUDIES: No results found.   ASSESSMENT: 75  y.o. Amanda [name] woman status post right breast upper outer quadrant biopsy 03/22/2015 for a clinical T1CN0, stage IA invasive ductal carcinoma, grade 1, estrogen and progesterone receptor positive, HER-2 not amplified, with an MIB-1 of 15%.  (1)  Status post right lumpectomy and sentinel lymph node sampling 04/16/2015 for a pT1c pN0, stage IA  Invasive ductal carcinoma, grade 1, repeat HER-2 again negative  (2) Oncotype DX  Score of 12 predicts a risk of outside the breast recurrence of 8% within 10 years if the patient's only systemic therapy is tamoxifen for 5 years. It also predicts no benefit from chemotherapy.  (3) adjuvant radiation completed 07/19/2015   (4) started anastrozole 10/08/2015, interrupted because of a rash, restarted 11/29/2015  (a) DEXA scan 05/28/2014 was normal with a T score of +0.7   PLAN: Amanda [name] is now a little over 5 years out from definitive surgery for her breast cancer with no evidence of disease recurrence.  This is very favorable.  She is tolerating anastrozole moderately well except for the nighttime hot flashes.  We tried gabapentin which was not helpful to her.  Today we discussed venlafaxine and I am starting her at 37.5.  She has a good understanding of the possible toxicities side effects and complications of this agent which are unlikely to occur at this very low dose.  After 2 or 3 weeks she may want to call us to let us know if this is not working for her in which case we would increase the dose to 75 mg daily  She is concerned about her blood pressure and we discussed how she can monitor it at home and if she does have repeatedly high counts she will check with her primary care physician regarding the best antidepressant for her to receive.  Of course the clonidine patch she is on for hot flashes also works for blood pressure at least to some extent  She will see me one final time a year from  now at which point she will be ready to "graduate"  Total encounter time 35 minutes.*   A[name], Virgie Dad, MD  07/22/19 9:32 AM Medical Oncology and Hematology [facility] [place],  [address] Tel. [phone]    Fax. [phone]   I, Wilburn Mylar, am acting as scribe for Dr. Virgie Dad. Magnum Lunde.  I, Lurline Del MD, have reviewed the above documentation for accuracy and completeness, and I agree with the above.    *Total Encounter Time as defined by the Centers for Medicare and Medicaid Services includes, in addition to the face-to-face time of a patient visit (documented in the note above) non-face-to-face time: obtaining and reviewing outside history, ordering and reviewing medications, tests or procedures, care coordination (communications with other health care professionals or caregivers) and documentation in the medical record.

## 2019-07-22 ENCOUNTER — Inpatient Hospital Stay: Payer: Medicare Other | Attending: Oncology | Admitting: Oncology

## 2019-07-22 ENCOUNTER — Inpatient Hospital Stay: Payer: Medicare Other

## 2019-07-22 ENCOUNTER — Other Ambulatory Visit: Payer: Self-pay

## 2019-07-22 VITALS — BP 162/62 | HR 52 | Temp 97.8°F | Resp 18 | Ht 63.0 in | Wt 176.7 lb

## 2019-07-22 DIAGNOSIS — Z7984 Long term (current) use of oral hypoglycemic drugs: Secondary | ICD-10-CM | POA: Insufficient documentation

## 2019-07-22 DIAGNOSIS — Z79899 Other long term (current) drug therapy: Secondary | ICD-10-CM | POA: Insufficient documentation

## 2019-07-22 DIAGNOSIS — Z90722 Acquired absence of ovaries, bilateral: Secondary | ICD-10-CM | POA: Insufficient documentation

## 2019-07-22 DIAGNOSIS — Z923 Personal history of irradiation: Secondary | ICD-10-CM | POA: Diagnosis not present

## 2019-07-22 DIAGNOSIS — Z90710 Acquired absence of both cervix and uterus: Secondary | ICD-10-CM | POA: Diagnosis not present

## 2019-07-22 DIAGNOSIS — K219 Gastro-esophageal reflux disease without esophagitis: Secondary | ICD-10-CM | POA: Diagnosis not present

## 2019-07-22 DIAGNOSIS — Z8249 Family history of ischemic heart disease and other diseases of the circulatory system: Secondary | ICD-10-CM | POA: Diagnosis not present

## 2019-07-22 DIAGNOSIS — E119 Type 2 diabetes mellitus without complications: Secondary | ICD-10-CM

## 2019-07-22 DIAGNOSIS — Z170 Estrogen receptor positive status [ER+]: Secondary | ICD-10-CM | POA: Diagnosis not present

## 2019-07-22 DIAGNOSIS — Z79811 Long term (current) use of aromatase inhibitors: Secondary | ICD-10-CM | POA: Insufficient documentation

## 2019-07-22 DIAGNOSIS — Z9079 Acquired absence of other genital organ(s): Secondary | ICD-10-CM | POA: Insufficient documentation

## 2019-07-22 DIAGNOSIS — C50411 Malignant neoplasm of upper-outer quadrant of right female breast: Secondary | ICD-10-CM | POA: Insufficient documentation

## 2019-07-22 LAB — CBC WITH DIFFERENTIAL/PLATELET
Abs Immature Granulocytes: 0.11 10*3/uL — ABNORMAL HIGH (ref 0.00–0.07)
Basophils Absolute: 0 10*3/uL (ref 0.0–0.1)
Basophils Relative: 0 %
Eosinophils Absolute: 0.1 10*3/uL (ref 0.0–0.5)
Eosinophils Relative: 1 %
HCT: 43.9 % (ref 36.0–46.0)
Hemoglobin: 14.4 g/dL (ref 12.0–15.0)
Immature Granulocytes: 1 %
Lymphocytes Relative: 36 %
Lymphs Abs: 3.9 10*3/uL (ref 0.7–4.0)
MCH: 31.6 pg (ref 26.0–34.0)
MCHC: 32.8 g/dL (ref 30.0–36.0)
MCV: 96.5 fL (ref 80.0–100.0)
Monocytes Absolute: 0.9 10*3/uL (ref 0.1–1.0)
Monocytes Relative: 9 %
Neutro Abs: 5.6 10*3/uL (ref 1.7–7.7)
Neutrophils Relative %: 53 %
Platelets: 339 10*3/uL (ref 150–400)
RBC: 4.55 MIL/uL (ref 3.87–5.11)
RDW: 15.1 % (ref 11.5–15.5)
WBC: 10.7 10*3/uL — ABNORMAL HIGH (ref 4.0–10.5)
nRBC: 0 % (ref 0.0–0.2)

## 2019-07-22 MED ORDER — ANASTROZOLE 1 MG PO TABS: 1.0000 mg | ORAL_TABLET | Freq: Every day | ORAL | 4 refills | Status: DC

## 2019-07-22 MED ORDER — VENLAFAXINE HCL 37.5 MG PO TABS: 37.5000 mg | ORAL_TABLET | Freq: Two times a day (BID) | ORAL | 4 refills | Status: DC

## 2019-07-23 ENCOUNTER — Telehealth: Payer: Self-pay | Admitting: Oncology

## 2019-07-23 ENCOUNTER — Telehealth: Payer: Self-pay | Admitting: *Deleted

## 2019-07-23 NOTE — Telephone Encounter (Signed)
VM received from the patient stating she started the venlafaxine yesterday prescribed at visit " and I am having a lot of side effects that are listed on the medication sheet "  Obtained identified VM- message left by this RN informing pt to hold on taking any more of the venlafaxine until she is able to speak with this office.  This RN's name and office number given for return call

## 2019-07-23 NOTE — Telephone Encounter (Signed)
Scheduled appts per 6/15 los. Pt confirmed appt date and time. Pt had questions regarding her medication and was transferred to desk nurse.

## 2019-07-24 ENCOUNTER — Telehealth: Payer: Self-pay | Admitting: *Deleted

## 2019-07-24 NOTE — Telephone Encounter (Signed)
This RN was able to speak with the patient this AM she states she took one dose of the venlafaxine pm Tuesday evening.   Upon rising yesterday she felt very disoriented, dizzy, mild nausea, " just unable to complete my thoughts and seemed to wander from room to room "  She states she returned to bed at 6 pm that evening.  This AM she states improvement with some mild lingering feelings.  Amanda [name] states she is also taking a muscle relaxant - tizanidine - as well as just completed a steroid dose pak for a pulled muscle in her back. She took the tizanidine that evening as well.  She is also on catapres patch 0.1mg .  She is wondering " could it be a combination of these medications ?"  This RN reviewed meds per drug interaction - with noted possible interaction of the tizanidine with catapres. Discussed with pt above as well as possible additional sensorium changes with use of the venlafaxine.  Pt states overall her hot flashes are more interfering and desires to have greater control- she is willing to try the venlafaxine alone.  She will remove her catapres patch tomorrow.  She will not take the tizanidine and take the venlafaxine alone on Saturday AM.  If tolerated she will not increase to twice a day but take daily and call this RN on Monday with update.

## 2019-07-28 ENCOUNTER — Telehealth: Payer: Self-pay | Admitting: *Deleted

## 2019-07-28 NOTE — Telephone Encounter (Signed)
Pt called with follow up on side effects since starting the venlafaxine last week - see prior note by this RN.  Rosan stayed on 1 tablet of the venlafaxine daily ( not bid ) - She held taking her muscle relaxant and did not reapply the catapres patch on Friday.  Kaysa states she continues to " feel very wiped out- very little energy and dizzy "  She states she is limiting her activities - especially driving due to " just concerned with not feeling right in my head "  She states she is continuing to have hot flashes- with some mild improvement in sleep " but then I feel like I could sleep more due to how weak I think this pill is making me "  Note pt has been on the medication 1 week with only 1 tab daily with no improvement in side effects.  Discussed medication side effects and how much it is interferring with her ADL's.  Per discussion- Eula will stop the venlafaxine and wait 24 hours before she reapplies her catapres.  This note will be given to MD for further review and recommendations.

## 2019-08-01 ENCOUNTER — Other Ambulatory Visit: Payer: Self-pay

## 2019-08-01 ENCOUNTER — Other Ambulatory Visit (INDEPENDENT_AMBULATORY_CARE_PROVIDER_SITE_OTHER): Payer: Medicare Other

## 2019-08-01 DIAGNOSIS — E119 Type 2 diabetes mellitus without complications: Secondary | ICD-10-CM

## 2019-08-01 DIAGNOSIS — E7800 Pure hypercholesterolemia, unspecified: Secondary | ICD-10-CM

## 2019-08-01 LAB — COMPREHENSIVE METABOLIC PANEL
ALT: 25 U/L (ref 0–35)
AST: 24 U/L (ref 0–37)
Albumin: 4.5 g/dL (ref 3.5–5.2)
Alkaline Phosphatase: 54 U/L (ref 39–117)
BUN: 25 mg/dL — ABNORMAL HIGH (ref 6–23)
CO2: 29 mEq/L (ref 19–32)
Calcium: 11.1 mg/dL — ABNORMAL HIGH (ref 8.4–10.5)
Chloride: 104 mEq/L (ref 96–112)
Creatinine, Ser: 0.93 mg/dL (ref 0.40–1.20)
GFR: 71.28 mL/min (ref >60.00)
Glucose, Bld: 122 mg/dL — ABNORMAL HIGH (ref 70–99)
Potassium: 5 mEq/L (ref 3.5–5.1)
Sodium: 142 mEq/L (ref 135–145)
Total Bilirubin: 0.6 mg/dL (ref 0.2–1.2)
Total Protein: 7.5 g/dL (ref 6.0–8.3)

## 2019-08-01 LAB — HEMOGLOBIN A1C: Hgb A1c MFr Bld: 6.9 % — ABNORMAL HIGH (ref 4.6–6.5)

## 2019-08-01 LAB — MICROALBUMIN / CREATININE URINE RATIO
Creatinine,U: 62.7 mg/dL
Microalb Creat Ratio: 1.5 mg/g (ref 0.0–30.0)
Microalb, Ur: 1 mg/dL (ref 0.0–1.9)

## 2019-08-01 LAB — LIPID PANEL
Cholesterol: 190 mg/dL (ref 0–200)
HDL: 77.2 mg/dL (ref >39.00)
LDL Cholesterol: 101 mg/dL — ABNORMAL HIGH (ref 0–99)
NonHDL: 112.45
Total CHOL/HDL Ratio: 2
Triglycerides: 57 mg/dL (ref 0.0–149.0)
VLDL: 11.4 mg/dL (ref 0.0–40.0)

## 2019-08-05 ENCOUNTER — Ambulatory Visit (INDEPENDENT_AMBULATORY_CARE_PROVIDER_SITE_OTHER): Payer: Medicare Other | Admitting: Endocrinology

## 2019-08-05 ENCOUNTER — Other Ambulatory Visit: Payer: Self-pay

## 2019-08-05 ENCOUNTER — Encounter: Payer: Self-pay | Admitting: Endocrinology

## 2019-08-05 VITALS — BP 140/70 | HR 65 | Ht 63.0 in | Wt 176.6 lb

## 2019-08-05 DIAGNOSIS — E7800 Pure hypercholesterolemia, unspecified: Secondary | ICD-10-CM

## 2019-08-05 DIAGNOSIS — E213 Hyperparathyroidism, unspecified: Secondary | ICD-10-CM | POA: Diagnosis not present

## 2019-08-05 DIAGNOSIS — E1165 Type 2 diabetes mellitus with hyperglycemia: Secondary | ICD-10-CM

## 2019-08-05 NOTE — Progress Notes (Addendum)
Amanda [name] is a 74 y.o. female.             Reason for Appointment: Endocrinology follow-up   Initial diagnosis of diabetes:?  2007   History of Present Illness   Diagnosis: Type 2 DIABETES MELITUS     Previous history: She has had difficulty with blood sugar control initially because of limited tolerability with metformin and difficulty with weight loss. She did very well with starting Victoza which has helped her blood sugar control as well as some improvement in her obesity Her insurance company did not prefer Victoza and she was switched to Trulicity Subsequently her glipizide was stopped She was started on Invokana 100 mg daily in addition to her Trulicity 6.07 mg when her blood sugars were higher in 2017  Recent history:     Non-insulin hypoglycemic drugs:   Invokana 371 mg daily  Trulicity 0.62 mg weekly   Current blood sugar patterns, management and problems identified:   Her A1c is slightly higher at 6.9  Although on her last visit she had gained some weight and is down 6 pounds recently  Except for the last few days she thinks she has been trying to be consistent with trying to walk on her treadmill for exercise  She has however checked her blood sugars very infrequently  Last Friday she had a steroid injection in her shoulder and her blood sugars are higher especially in the mornings  However before the injection her fasting glucose was 122 in the lab  She does not think she is going off her diet          Side effects from medications: Diarrhea from metformin.  Nausea from 1.5 mg Trulicity         Monitors blood glucose:  less than Once a day.    Glucometer: One Touch ultra 2   PRE-MEAL Fasting Lunch Dinner Bedtime Overall  Glucose range:  141-187  132, 135  125, 132    Mean/median:      151   POST-MEAL PC Breakfast PC Lunch PC Dinner  Glucose range:    175  Mean/median:        Previously: Range 107-148 with most readings midday and early  afternoon and some in the evening  AVERAGE 128  Physical activity: exercise: walking on treadmill 3-4 days/7 upto 60 min            Wt Readings from Last 3 Encounters:  08/05/19 176 lb 9.6 oz (80.1 kg)  07/22/19 176 lb 11.2 oz (80.2 kg)  03/11/19 182 lb 6.4 oz (82.7 kg)    LABS:  Lab Results  Component Value Date   HGBA1C 6.9 (H) 08/01/2019   HGBA1C 6.6 (H) 03/05/2019   HGBA1C 6.6 (H) 10/08/2018   Lab Results  Component Value Date   MICROALBUR 1.0 08/01/2019   LDLCALC 101 (H) 08/01/2019   CREATININE 0.93 08/01/2019   Other active problems discussed today: See review of systems   Lab on 08/01/2019  Component Date Value Ref Range Status  . Cholesterol 08/01/2019 190  0 - 200 mg/dL Final   ATP III Classification       Desirable:  < 200 mg/dL               Borderline High:  200 - 239 mg/dL          High:  > = 240 mg/dL  . Triglycerides 08/01/2019 57.0  0 - 149 mg/dL Final   Normal:  <150  mg/dLBorderline High:  150 - 199 mg/dL  . HDL 08/01/2019 77.20  >39.00 mg/dL Final  . VLDL 08/01/2019 11.4  0.0 - 40.0 mg/dL Final  . LDL Cholesterol 08/01/2019 101* 0 - 99 mg/dL Final  . Total CHOL/HDL Ratio 08/01/2019 2   Final                  Men          Women1/2 Average Risk     3.4          3.3Average Risk          5.0          4.42X Average Risk          9.6          7.13X Average Risk          15.0          11.0                      . NonHDL 08/01/2019 112.45   Final   NOTE:  Non-HDL goal should be 30 mg/dL higher than patient's LDL goal (i.e. LDL goal of < 70 mg/dL, would have non-HDL goal of < 100 mg/dL)  . Microalb, Ur 08/01/2019 1.0  0.0 - 1.9 mg/dL Final  . Creatinine,U 08/01/2019 62.7  mg/dL Final  . Microalb Creat Ratio 08/01/2019 1.5  0.0 - 30.0 mg/g Final  . Sodium 08/01/2019 142  135 - 145 mEq/L Final  . Potassium 08/01/2019 5.0  3.5 - 5.1 mEq/L Final  . Chloride 08/01/2019 104  96 - 112 mEq/L Final  . CO2 08/01/2019 29  19 - 32 mEq/L Final  . Glucose, Bld 08/01/2019  122* 70 - 99 mg/dL Final  . BUN 08/01/2019 25* 6 - 23 mg/dL Final  . Creatinine, Ser 08/01/2019 0.93  0.40 - 1.20 mg/dL Final  . Total Bilirubin 08/01/2019 0.6  0.2 - 1.2 mg/dL Final  . Alkaline Phosphatase 08/01/2019 54  39 - 117 U/L Final  . AST 08/01/2019 24  0 - 37 U/L Final  . ALT 08/01/2019 25  0 - 35 U/L Final  . Total Protein 08/01/2019 7.5  6.0 - 8.3 g/dL Final  . Albumin 08/01/2019 4.5  3.5 - 5.2 g/dL Final  . GFR 08/01/2019 71.28  >60.00 mL/min Final  . Calcium 08/01/2019 11.1* 8.4 - 10.5 mg/dL Final  . Hgb A1c MFr Bld 08/01/2019 6.9* 4.6 - 6.5 % Final   Glycemic Control Guidelines for People with Diabetes:Non Diabetic:  <6%Goal of Therapy: <7%Additional Action Suggested:  >8%     Allergies as of 08/05/2019      Reactions   Statins Other (See Comments)   Muscle pain. Pt able to take Crestor but not everyday.   Meperidine Hcl Rash      Medication List       Accurate as of August 05, 2019  9:10 AM. If you have any questions, ask your nurse or doctor.        anastrozole 1 MG tablet Commonly known as: ARIMIDEX Take 1 tablet (1 mg total) by mouth daily.   canagliflozin 300 MG Tabs tablet Commonly known as: Invokana TAKE 1 TABLET BY MOUTH DAILY BEFORE THE FIRST MEAL OF THE DAY   cloNIDine 0.1 mg/24hr patch Commonly known as: CATAPRES - Dosed in mg/24 hr APPLY 1 PATCH EXTERNALLY TO THE SKIN EVERY WEEK   glucose blood test strip Use Onetouch Ultra test strips as  instructed to check blood sugar once daily.   Livalo 4 MG Tabs Generic drug: Pitavastatin Calcium Take one tablet daily   olopatadine 0.1 % ophthalmic solution Commonly known as: PATANOL Instill 1 drop in left eye twice daily   ONE TOUCH ULTRA 2 w/Device Kit 1 each by Does not apply route daily. Use daily to check blood sugar.   tiZANidine 4 MG tablet Commonly known as: ZANAFLEX Take 4 mg by mouth at bedtime as needed.   Trulicity 2.67 TI/4.5YK Sopn Generic drug: Dulaglutide INJECT 0.75 MG UNDER  THE SKIN ONCE A WEEK   venlafaxine 37.5 MG tablet Commonly known as: EFFEXOR Take 1 tablet (37.5 mg total) by mouth 2 (two) times daily.   Vitamin D3 50 MCG (2000 UT) Tabs Take 6,000 Units by mouth daily.       Allergies:  Allergies  Allergen Reactions  . Statins Other (See Comments)    Muscle pain. Pt able to take Crestor but not everyday.  . Meperidine Hcl Rash    Past Medical History:  Diagnosis Date  . Arthritis   . Breast cancer ([place])   . Breast cancer of upper-outer quadrant of right female breast ([place]) 03/24/2015  . Carpal tunnel syndrome   . Diabetes mellitus without complication ([place])    type 2  . GERD (gastroesophageal reflux disease)    Pt takes OTC when needed.  Marland Kitchen Hot flashes     Past Surgical History:  Procedure Laterality Date  . ABDOMINAL HYSTERECTOMY    . APPENDECTOMY    . COLONOSCOPY    . ESOPHAGOGASTRODUODENOSCOPY    . EYE SURGERY Bilateral    Cataracts removed  . JOINT REPLACEMENT    . RADIOACTIVE SEED GUIDED PARTIAL MASTECTOMY WITH AXILLARY SENTINEL LYMPH NODE BIOPSY Right 04/16/2015   Procedure: RADIOACTIVE SEED GUIDED PARTIAL MASTECTOMY WITH AXILLARY SENTINEL LYMPH NODE BIOPSY;  Surgeon: Alphonsa Overall, MD;  Location: [facility];  Service: General;  Laterality: Right;  . SHOULDER SURGERY Right   . TONSILLECTOMY    . TOTAL KNEE ARTHROPLASTY Right 07/18/2013   DR GRAVES  . TOTAL KNEE ARTHROPLASTY Right 07/18/2013   Procedure: TOTAL KNEE ARTHROPLASTY;  Surgeon: Alta Corning, MD;  Location: [facility];  Service: Orthopedics;  Laterality: Right;  . TOTAL KNEE ARTHROPLASTY Left 06/15/2017   Procedure: LEFT TOTAL KNEE ARTHROPLASTY;  Surgeon: Dorna Leitz, MD;  Location: [facility];  Service: Orthopedics;  Laterality: Left;    No family history on file.  Social History:  reports that she has never smoked. She has never used smokeless tobacco. She reports current alcohol use. She reports that she does not use drugs.  Review of  Systems:  Lipids:  She was switched from Crestor to Livalo because of her difficulty tolerating this because of joint pains . She is taking Livalo 4 mg, now able to take this every day She tries to take Tylenol if she has any muscle or joint pains   Previously baseline LDL has been as high as 200 LDL much better compared to 9/20 but still above 100   Lab Results  Component Value Date   CHOL 190 08/01/2019   CHOL 235 (H) 10/08/2018   CHOL 260 (H) 02/15/2018   Lab Results  Component Value Date   HDL 77.20 08/01/2019   HDL 75.30 10/08/2018   HDL 77.20 02/15/2018   Lab Results  Component Value Date   LDLCALC 101 (H) 08/01/2019   LDLCALC 148 (H) 10/08/2018   LDLCALC 168 (H) 02/15/2018  Lab Results  Component Value Date   TRIG 57.0 08/01/2019   TRIG 58.0 10/08/2018   TRIG 70.0 02/15/2018   Lab Results  Component Value Date   CHOLHDL 2 08/01/2019   CHOLHDL 3 10/08/2018   CHOLHDL 3 02/15/2018   Lab Results  Component Value Date   LDLDIRECT 120.1 09/30/2013   LDLDIRECT 115.5 03/24/2013   LDLDIRECT 204.9 11/19/2012     She takes Catapres patch prescribed by her oncologist mostly for sweats and not for hypertension  Foot exam done in 6/21, normal exam  Eye exam 10/19, no abnormalities found  She is seen by [facility] and is due for follow-up exam  She takes vitamin D 6000 units daily as advised by her PCP  Bone density was normal in 2016 done by PCP, has not been scheduled for follow-up  HYPERCALCEMIA: She appears to have persistently high or high normal calcium levels since 6/19 Not on any HCTZ  Lab Results  Component Value Date   CALCIUM 11.1 (H) 08/01/2019   CALCIUM 10.2 03/05/2019   CALCIUM 10.6 (H) 10/08/2018   CALCIUM 10.1 07/05/2018   CALCIUM 10.7 (H) 02/15/2018   CALCIUM 10.5 09/14/2017   CALCIUM 10.7 (H) 07/17/2017   Lab Results  Component Value Date   PTH 29 10/11/2018   CALCIUM 11.1 (H) 08/01/2019   CAION 1.09 (L) 01/10/2009       Examination:     BP 140/70 (BP Location: Left Arm, Patient Position: Sitting)   Pulse 65   Ht '5\' 3"'  (1.6 m)   Wt 176 lb 9.6 oz (80.1 kg)   SpO2 97%   BMI 31.28 kg/m   Body mass index is 31.28 kg/m.   Diabetic Foot Exam - Simple   Simple Foot Form Diabetic Foot exam was performed with the following findings: Yes 08/05/2019  9:28 AM  Visual Inspection No deformities, no ulcerations, no other skin breakdown bilaterally: Yes Sensation Testing Intact to touch and monofilament testing bilaterally: Yes Pulse Check Posterior Tibialis and Dorsalis pulse intact bilaterally: Yes Comments      ASSESSMENT/ PLAN:  Diabetes type 2 with mild obesity  See history of present illness for discussion of current diabetes management, blood sugar patterns and problems identified  Her A1c has slightly increased at 6.9  She is on Trulicity and Invokana  Has not checked blood sugars much at home and not clear why her A1c is higher despite her weight loss Previously had not tolerated higher dose of Trulicity She does exercise regularly Only in the last few days her sugars are higher from a steroid injection in her shoulder  For now we will continue the same regimen She needs to check her sugars more regularly and let us know if morning sugars are consistently over 140 in the next couple of weeks May consider low-dose Amaryl   HYPERCHOLESTEROLEMIA:  She has taken Livalo daily and LDL is around 100  She will continue the same dose  HYPERCALCEMIA: Level is 11.1 which is slightly higher than usual but again asymptomatic  Likely has primary hyperparathyroidism even with normal PTH Not a surgical candidate at this time  Have recommended repeat bone density, to be checked by PCP   Follow-up in 3 months  There are no Patient Instructions on file for this visit.   [name] 08/05/2019, 9:10 AM

## 2019-08-12 ENCOUNTER — Other Ambulatory Visit: Payer: Self-pay | Admitting: Endocrinology

## 2019-09-05 ENCOUNTER — Other Ambulatory Visit: Payer: Self-pay | Admitting: Oncology

## 2019-10-20 ENCOUNTER — Emergency Department (HOSPITAL_COMMUNITY): Payer: Medicare Other

## 2019-10-20 ENCOUNTER — Emergency Department (HOSPITAL_COMMUNITY)
Admission: EM | Admit: 2019-10-20 | Discharge: 2019-10-21 | Disposition: A | Discharge status: Home / Self Care | Payer: Medicare Other | Attending: Emergency Medicine | Admitting: Emergency Medicine

## 2019-10-20 ENCOUNTER — Encounter (HOSPITAL_COMMUNITY): Payer: Self-pay

## 2019-10-20 ENCOUNTER — Other Ambulatory Visit: Payer: Self-pay

## 2019-10-20 DIAGNOSIS — E669 Obesity, unspecified: Secondary | ICD-10-CM | POA: Insufficient documentation

## 2019-10-20 DIAGNOSIS — R0989 Other specified symptoms and signs involving the circulatory and respiratory systems: Secondary | ICD-10-CM | POA: Insufficient documentation

## 2019-10-20 DIAGNOSIS — J40 Bronchitis, not specified as acute or chronic: Secondary | ICD-10-CM | POA: Diagnosis not present

## 2019-10-20 DIAGNOSIS — Z96652 Presence of left artificial knee joint: Secondary | ICD-10-CM | POA: Diagnosis not present

## 2019-10-20 DIAGNOSIS — Z96651 Presence of right artificial knee joint: Secondary | ICD-10-CM | POA: Diagnosis not present

## 2019-10-20 DIAGNOSIS — R0602 Shortness of breath: Secondary | ICD-10-CM | POA: Diagnosis present

## 2019-10-20 DIAGNOSIS — Z20822 Contact with and (suspected) exposure to covid-19: Secondary | ICD-10-CM | POA: Diagnosis not present

## 2019-10-20 DIAGNOSIS — Z79899 Other long term (current) drug therapy: Secondary | ICD-10-CM | POA: Diagnosis not present

## 2019-10-20 DIAGNOSIS — E119 Type 2 diabetes mellitus without complications: Secondary | ICD-10-CM | POA: Insufficient documentation

## 2019-10-20 MED ORDER — ALBUTEROL SULFATE HFA 108 (90 BASE) MCG/ACT IN AERS
2.0000 | INHALATION_SPRAY | Freq: Once | RESPIRATORY_TRACT | Status: AC
  Administered 2019-10-21: 2 via RESPIRATORY_TRACT
  Filled 2019-10-20: qty 6.7

## 2019-10-20 NOTE — ED Provider Notes (Signed)
[facility] DEPT Provider Note   CSN: [id number] Arrival date & time: 10/20/19  1233     History Chief Complaint  Patient presents with  . Cough  . Shortness of Breath  . Sore Throat    Amanda [name] is a 74 y.o. female.  HPI     This is a 74 year old female with a history of breast cancer, diabetes who presents with shortness of breath and cough.  Patient reports that she developed upper respiratory symptoms about 2 weeks ago.  She had some grandchildren that had similar symptoms.  They all recovered.  No one at that time was tested for COVID-19 and she reports that she is fully vaccinated.  She seemed to improve but has since redeveloped shortness of breath and a nonproductive cough.  She states the cough is worse at night when she lays flat.  She has not noted any lower extremity swelling.  She has used an inhaler with minimal relief.  No chest pain, abdominal pain, nausea, vomiting, diarrhea.  Past Medical History:  Diagnosis Date  . Arthritis   . Breast cancer ([place])   . Breast cancer of upper-outer quadrant of right female breast ([place]) 03/24/2015  . Carpal tunnel syndrome   . Diabetes mellitus without complication ([place])    type 2  . GERD (gastroesophageal reflux disease)    Pt takes OTC when needed.  Marland Kitchen Hot flashes     Patient Active Problem List   Diagnosis Date Noted  . Primary osteoarthritis of left knee 06/15/2017  . Diabetes mellitus type II, non insulin dependent ([place]) 03/31/2015  . Malignant neoplasm of upper-outer quadrant of right breast in female, estrogen receptor positive ([place]) 03/24/2015  . Acute blood loss anemia 07/20/2013  . Osteoarthritis of right knee 07/18/2013  . Osteoarthritis of left knee 07/18/2013  . Hyperlipidemia 11/26/2012  . Osteoarthritis of both knees 11/26/2012  . COUGH 03/11/2007    Past Surgical History:  Procedure Laterality Date  . ABDOMINAL HYSTERECTOMY    . APPENDECTOMY    . COLONOSCOPY    .  ESOPHAGOGASTRODUODENOSCOPY    . EYE SURGERY Bilateral    Cataracts removed  . JOINT REPLACEMENT    . RADIOACTIVE SEED GUIDED PARTIAL MASTECTOMY WITH AXILLARY SENTINEL LYMPH NODE BIOPSY Right 04/16/2015   Procedure: RADIOACTIVE SEED GUIDED PARTIAL MASTECTOMY WITH AXILLARY SENTINEL LYMPH NODE BIOPSY;  Surgeon: Alphonsa Overall, MD;  Location: [facility];  Service: General;  Laterality: Right;  . SHOULDER SURGERY Right   . TONSILLECTOMY    . TOTAL KNEE ARTHROPLASTY Right 07/18/2013   DR GRAVES  . TOTAL KNEE ARTHROPLASTY Right 07/18/2013   Procedure: TOTAL KNEE ARTHROPLASTY;  Surgeon: Alta Corning, MD;  Location: [facility];  Service: Orthopedics;  Laterality: Right;  . TOTAL KNEE ARTHROPLASTY Left 06/15/2017   Procedure: LEFT TOTAL KNEE ARTHROPLASTY;  Surgeon: Dorna Leitz, MD;  Location: [facility];  Service: Orthopedics;  Laterality: Left;     OB History   No obstetric history on file.     History reviewed. No pertinent family history.  Social History   Tobacco Use  . Smoking status: Never Smoker  . Smokeless tobacco: Never Used  Vaping Use  . Vaping Use: Never used  Substance Use Topics  . Alcohol use: Yes    Comment: socially, occ weekends  . Drug use: No    Home Medications Prior to Admission medications   Medication Sig Start Date End Date Taking? Authorizing Provider  anastrozole (ARIMIDEX) 1 MG  tablet Take 1 tablet (1 mg total) by mouth daily. 07/22/19   Magrinat, Virgie Dad, MD  Blood Glucose Monitoring Suppl (ONE TOUCH ULTRA 2) w/Device KIT 1 each by Does not apply route daily. Use daily to check blood sugar. 01/14/18   [name], MD  Cholecalciferol (VITAMIN D3) 2000 units TABS Take 6,000 Units by mouth daily.     [provider]  cloNIDine (CATAPRES - DOSED IN MG/24 HR) 0.1 mg/24hr patch PLACE 1 PATCH ON THE SKIN ONCE A WEEK 09/09/19   Magrinat, Virgie Dad, MD  glucose blood test strip Use Onetouch Ultra test strips as instructed to check blood sugar once daily.  12/18/18   [name], MD  INVOKANA 300 MG TABS tablet TAKE 1 TABLET BY MOUTH DAILY BEFORE FIRST MEAL OF THE DAY 08/12/19   [name], MD  olopatadine (PATANOL) 0.1 % ophthalmic solution Instill 1 drop in left eye twice daily 05/14/17   [provider]  Pitavastatin Calcium (LIVALO) 4 MG TABS Take one tablet daily 06/09/19   [name], MD  TRULICITY 1.01 BP/1.0CH SOPN INJECT 0.75 MG UNDER THE SKIN ONCE A WEEK 12/16/18   [name], MD  venlafaxine (EFFEXOR) 37.5 MG tablet Take 1 tablet (37.5 mg total) by mouth 2 (two) times daily. 07/22/19   Magrinat, Virgie Dad, MD    Allergies    Statins and Meperidine hcl  Review of Systems   Review of Systems  Constitutional: Negative for fever.  Respiratory: Positive for cough and shortness of breath. Negative for wheezing.   Cardiovascular: Negative for chest pain.  Gastrointestinal: Negative for abdominal pain, diarrhea, nausea and vomiting.  Genitourinary: Negative for dysuria.  All other systems reviewed and are negative.   Physical Exam Updated Vital Signs BP (!) 149/68 (BP Location: Left Arm)   Pulse 71   Temp 98.6 F (37 C) (Oral)   Resp 20   Ht 1.588 m (5' 2.5")   Wt 79.4 kg   SpO2 98%   BMI 31.50 kg/m   Physical Exam Vitals and nursing note reviewed.  Constitutional:      Appearance: She is well-developed. She is obese. She is not ill-appearing.  HENT:     Head: Normocephalic and atraumatic.     Mouth/Throat:     Mouth: Mucous membranes are moist.     Pharynx: No oropharyngeal exudate.  Eyes:     Pupils: Pupils are equal, round, and reactive to light.  Cardiovascular:     Rate and Rhythm: Normal rate and regular rhythm.     Heart sounds: Normal heart sounds.  Pulmonary:     Effort: Pulmonary effort is normal. No respiratory distress.     Breath sounds: Rhonchi present. No wheezing.     Comments: Rhonchi right upper lobe Abdominal:     General: Bowel sounds are normal.     Palpations: Abdomen is soft.    Musculoskeletal:     Cervical back: Neck supple.     Right lower leg: No tenderness. No edema.     Left lower leg: No tenderness. No edema.  Skin:    General: Skin is warm and dry.  Neurological:     Mental Status: She is alert and oriented to person, place, and time.  Psychiatric:        Mood and Affect: Mood normal.     ED Results / Procedures / Treatments   Labs (all labs ordered are listed, but only abnormal results are displayed) Labs Reviewed  SARS CORONAVIRUS 2  BY RT PCR [facility] ORDER, [place] LAB)    EKG None  Radiology DG Chest 2 View  Result Date: 10/21/2019 CLINICAL DATA:  Shortness of breath EXAM: CHEST - 2 VIEW COMPARISON:  06/07/2017 FINDINGS: The heart size and mediastinal contours are within normal limits. Both lungs are clear. The visualized skeletal structures are unremarkable. IMPRESSION: No active cardiopulmonary disease. Electronically Signed   By: Ulyses Jarred M.D.   On: 10/21/2019 00:13    Procedures Procedures (including critical care time)  Medications Ordered in ED Medications  dexamethasone (DECADRON) tablet 10 mg (has no administration in time range)  albuterol (VENTOLIN HFA) 108 (90 Base) MCG/ACT inhaler 2 puff (2 puffs Inhalation Given 10/21/19 0134)    ED Course  I have reviewed the triage vital signs and the nursing notes.  Pertinent labs & imaging results that were available during my care of the patient were reviewed by me and considered in my medical decision making (see chart for details).    MDM Rules/Calculators/A&P                           Patient presents with ongoing cough and shortness of breath.  Reports viral symptoms last week with recurrence and worsening of symptoms this week.  She is overall nontoxic.  She is afebrile.  She is satting at 98 to 99% on room air.  She is fully vaccinated for COVID-19.  Given prevalence of Covid in the community, will send Covid testing.  Chest x-ray obtained  and shows no evidence of pneumothorax or pneumonia.  I have a high suspicion for viral etiology given her history.  Low suspicion for PE as her heart rate is normal and pulse ox is normal.  She also has infectious symptoms.  Covid testing is negative.  Suspect acute bronchitis.  Patient was given a dose of Decadron.  Will discharge with an inhaler and symptom control.  After history, exam, and medical workup I feel the patient has been appropriately medically screened and is safe for discharge home. Pertinent diagnoses were discussed with the patient. Patient was given return precautions.   Final Clinical Impression(s) / ED Diagnoses Final diagnoses:  Bronchitis    Rx / DC Orders ED Discharge Orders    None       Koen Antilla, Barbette Hair, MD 10/21/19 [phone]

## 2019-10-20 NOTE — ED Triage Notes (Signed)
Patient c/o cough, SOB, and Sore throat x 1 week.

## 2019-10-21 DIAGNOSIS — J40 Bronchitis, not specified as acute or chronic: Secondary | ICD-10-CM | POA: Diagnosis not present

## 2019-10-21 LAB — SARS CORONAVIRUS 2 BY RT PCR (HOSPITAL ORDER, PERFORMED IN ~~LOC~~ HOSPITAL LAB): SARS Coronavirus 2: NEGATIVE

## 2019-10-21 MED ORDER — DEXAMETHASONE 4 MG PO TABS
10.0000 mg | ORAL_TABLET | Freq: Once | ORAL | Status: AC
  Administered 2019-10-21: 10 mg via ORAL
  Filled 2019-10-21: qty 2

## 2019-10-21 NOTE — Discharge Instructions (Addendum)
You were seen today for shortness of breath and ongoing cough.  You likely have bronchitis related to a viral infection.  Your Covid testing is negative.  Continue your albuterol as needed.  You were given a dose of steroids while in the emergency room.  Follow-up with your primary doctor for any new or ongoing symptoms.

## 2019-11-03 ENCOUNTER — Other Ambulatory Visit: Payer: Medicare Other

## 2019-11-05 ENCOUNTER — Ambulatory Visit: Payer: Medicare Other | Admitting: Endocrinology

## 2019-11-20 ENCOUNTER — Telehealth: Payer: Self-pay | Admitting: Endocrinology

## 2019-11-20 NOTE — Telephone Encounter (Signed)
Attempted to reach patient. No answer. Vm left to call back

## 2019-11-20 NOTE — Telephone Encounter (Signed)
Patient called stating she would like a nurse to give her a call because she had some questions about one of her medications. This was all the information the patient gave me. Ph# [phone]

## 2019-11-21 ENCOUNTER — Other Ambulatory Visit: Payer: Self-pay | Admitting: Endocrinology

## 2019-11-21 MED ORDER — CANAGLIFLOZIN 300 MG PO TABS: ORAL_TABLET | ORAL | 0 refills | Status: DC

## 2019-11-23 MED ORDER — CANAGLIFLOZIN 300 MG PO TABS: ORAL_TABLET | ORAL | 0 refills | Status: DC

## 2019-11-23 NOTE — Addendum Note (Signed)
Addended by: Amado Coe on: 11/23/2019 04:52 PM   Modules accepted: Orders

## 2019-11-24 NOTE — Telephone Encounter (Signed)
Called pt --stated just reminding about the Rx invokana and Trulicity--insurance not covered for 90 days certain pharmacy. But pt do  not want anything change at this point.

## 2019-12-01 ENCOUNTER — Other Ambulatory Visit: Payer: Self-pay

## 2019-12-01 ENCOUNTER — Telehealth: Payer: Self-pay | Admitting: Endocrinology

## 2019-12-01 ENCOUNTER — Other Ambulatory Visit (INDEPENDENT_AMBULATORY_CARE_PROVIDER_SITE_OTHER): Payer: Medicare Other

## 2019-12-01 DIAGNOSIS — E1165 Type 2 diabetes mellitus with hyperglycemia: Secondary | ICD-10-CM

## 2019-12-01 LAB — BASIC METABOLIC PANEL
BUN: 30 mg/dL — ABNORMAL HIGH (ref 6–23)
CO2: 25 mEq/L (ref 19–32)
Calcium: 10.4 mg/dL (ref 8.4–10.5)
Chloride: 105 mEq/L (ref 96–112)
Creatinine, Ser: 0.96 mg/dL (ref 0.40–1.20)
GFR: 58.3 mL/min — ABNORMAL LOW (ref >60.00)
Glucose, Bld: 101 mg/dL — ABNORMAL HIGH (ref 70–99)
Potassium: 4.8 mEq/L (ref 3.5–5.1)
Sodium: 138 mEq/L (ref 135–145)

## 2019-12-01 LAB — HEMOGLOBIN A1C: Hgb A1c MFr Bld: 6.9 % — ABNORMAL HIGH (ref 4.6–6.5)

## 2019-12-01 NOTE — Telephone Encounter (Signed)
Patient was here to get her labs done and brought in some patient assistance paperwork asking for Dr Dwyane Dee to please complete his portion. Placing in Kumar's box.

## 2019-12-02 ENCOUNTER — Ambulatory Visit: Payer: Medicare Other | Admitting: Endocrinology

## 2019-12-03 ENCOUNTER — Other Ambulatory Visit: Payer: Self-pay

## 2019-12-03 ENCOUNTER — Encounter: Payer: Self-pay | Admitting: Endocrinology

## 2019-12-03 ENCOUNTER — Ambulatory Visit (INDEPENDENT_AMBULATORY_CARE_PROVIDER_SITE_OTHER): Payer: Medicare Other | Admitting: Endocrinology

## 2019-12-03 VITALS — BP 130/76 | HR 75 | Wt 178.6 lb

## 2019-12-03 DIAGNOSIS — E7800 Pure hypercholesterolemia, unspecified: Secondary | ICD-10-CM | POA: Diagnosis not present

## 2019-12-03 DIAGNOSIS — E1165 Type 2 diabetes mellitus with hyperglycemia: Secondary | ICD-10-CM | POA: Diagnosis not present

## 2019-12-03 NOTE — Progress Notes (Signed)
Amanda [name] is a 74 y.o. female.             Reason for Appointment: Endocrinology follow-up   Initial diagnosis of diabetes:?  2007   History of Present Illness   Diagnosis: Type 2 DIABETES MELITUS     Previous history: She has had difficulty with blood sugar control initially because of limited tolerability with metformin and difficulty with weight loss. She did very well with starting Victoza which has helped her blood sugar control as well as some improvement in her obesity Her insurance company did not prefer Victoza and she was switched to Trulicity Subsequently her glipizide was stopped She was started on Invokana 100 mg daily in addition to her Trulicity 6.73 mg when her blood sugars were higher in 2017  Recent history:    Non-insulin hypoglycemic drugs:   Invokana 419 mg daily  Trulicity 3.79 mg weekly   Current blood sugar patterns, management and problems identified:   Her A1c is again slightly higher at 6.9  However her blood sugars are better compared to her last visit at least recently with some readings in the morning and some after dinner  She is now starting to use the patient assistance program for Trulicity  She is trying to walk on her treadmill quite regularly at least 4 to 5 days a week now  However still her weight is about the same  She feels like her diet is fairly good          Side effects from medications: Diarrhea from metformin.  Nausea from 1.5 mg Trulicity         Monitors blood glucose:  less than Once a day.    Glucometer: One Touch ultra 2   PRE-MEAL  morning Lunch Dinner Bedtime Overall  Glucose range:  109-123    112-152  104-152  Mean/median:      123   Previously:  PRE-MEAL Fasting Lunch Dinner Bedtime Overall  Glucose range:  141-187  132, 135  125, 132    Mean/median:      151   POST-MEAL PC Breakfast PC Lunch PC Dinner  Glucose range:    175  Mean/median:       Physical activity: exercise: walking on treadmill 4  days/7 upto 60 min            Wt Readings from Last 3 Encounters:  12/03/19 178 lb 9.6 oz (81 kg)  10/20/19 175 lb (79.4 kg)  08/05/19 176 lb 9.6 oz (80.1 kg)    LABS:  Lab Results  Component Value Date   HGBA1C 6.9 (H) 12/01/2019   HGBA1C 6.9 (H) 08/01/2019   HGBA1C 6.6 (H) 03/05/2019   Lab Results  Component Value Date   MICROALBUR 1.0 08/01/2019   LDLCALC 101 (H) 08/01/2019   CREATININE 0.96 12/01/2019   Other active problems discussed today: See review of systems   Lab on 12/01/2019  Component Date Value Ref Range Status  . Sodium 12/01/2019 138  135 - 145 mEq/L Final  . Potassium 12/01/2019 4.8  3.5 - 5.1 mEq/L Final  . Chloride 12/01/2019 105  96 - 112 mEq/L Final  . CO2 12/01/2019 25  19 - 32 mEq/L Final  . Glucose, Bld 12/01/2019 101* 70 - 99 mg/dL Final  . BUN 12/01/2019 30* 6 - 23 mg/dL Final  . Creatinine, Ser 12/01/2019 0.96  0.40 - 1.20 mg/dL Final  . GFR 12/01/2019 58.30* >60.00 mL/min Final  . Calcium 12/01/2019 10.4  8.4 -  10.5 mg/dL Final  . Hgb A1c MFr Bld 12/01/2019 6.9* 4.6 - 6.5 % Final   Glycemic Control Guidelines for People with Diabetes:Non Diabetic:  <6%Goal of Therapy: <7%Additional Action Suggested:  >8%     Allergies as of 12/03/2019      Reactions   Statins Other (See Comments)   Muscle pain. Pt able to take Crestor but not everyday.   Meperidine Hcl Rash      Medication List       Accurate as of December 03, 2019 11:33 AM. If you have any questions, ask your nurse or doctor.        anastrozole 1 MG tablet Commonly known as: ARIMIDEX Take 1 tablet (1 mg total) by mouth daily.   canagliflozin 300 MG Tabs tablet Commonly known as: Invokana TAKE 1 TABLET BY MOUTH DAILY BEFORE FIRST MEAL OF THE DAY   cloNIDine 0.1 mg/24hr patch Commonly known as: CATAPRES - Dosed in mg/24 hr PLACE 1 PATCH ON THE SKIN ONCE A WEEK   glucose blood test strip Use Onetouch Ultra test strips as instructed to check blood sugar once daily.   Livalo  4 MG Tabs Generic drug: Pitavastatin Calcium Take one tablet daily   olopatadine 0.1 % ophthalmic solution Commonly known as: PATANOL Instill 1 drop in left eye twice daily   ONE TOUCH ULTRA 2 w/Device Kit 1 each by Does not apply route daily. Use daily to check blood sugar.   Trulicity 5.63 SL/3.7DS Sopn Generic drug: Dulaglutide INJECT 0.75 MG UNDER THE SKIN ONCE A WEEK   venlafaxine 37.5 MG tablet Commonly known as: EFFEXOR Take 1 tablet (37.5 mg total) by mouth 2 (two) times daily.   Vitamin D3 50 MCG (2000 UT) Tabs Take 6,000 Units by mouth daily.       Allergies:  Allergies  Allergen Reactions  . Statins Other (See Comments)    Muscle pain. Pt able to take Crestor but not everyday.  . Meperidine Hcl Rash    Past Medical History:  Diagnosis Date  . Arthritis   . Breast cancer ([place])   . Breast cancer of upper-outer quadrant of right female breast ([place]) 03/24/2015  . Carpal tunnel syndrome   . Diabetes mellitus without complication ([place])    type 2  . GERD (gastroesophageal reflux disease)    Pt takes OTC when needed.  Marland Kitchen Hot flashes     Past Surgical History:  Procedure Laterality Date  . ABDOMINAL HYSTERECTOMY    . APPENDECTOMY    . COLONOSCOPY    . ESOPHAGOGASTRODUODENOSCOPY    . EYE SURGERY Bilateral    Cataracts removed  . JOINT REPLACEMENT    . RADIOACTIVE SEED GUIDED PARTIAL MASTECTOMY WITH AXILLARY SENTINEL LYMPH NODE BIOPSY Right 04/16/2015   Procedure: RADIOACTIVE SEED GUIDED PARTIAL MASTECTOMY WITH AXILLARY SENTINEL LYMPH NODE BIOPSY;  Surgeon: Alphonsa Overall, MD;  Location: [place];  Service: General;  Laterality: Right;  . SHOULDER SURGERY Right   . TONSILLECTOMY    . TOTAL KNEE ARTHROPLASTY Right 07/18/2013   DR GRAVES  . TOTAL KNEE ARTHROPLASTY Right 07/18/2013   Procedure: TOTAL KNEE ARTHROPLASTY;  Surgeon: Alta Corning, MD;  Location: [facility];  Service: Orthopedics;  Laterality: Right;  . TOTAL KNEE ARTHROPLASTY Left  06/15/2017   Procedure: LEFT TOTAL KNEE ARTHROPLASTY;  Surgeon: Dorna Leitz, MD;  Location: [facility];  Service: Orthopedics;  Laterality: Left;    No family history on file.  Social History:  reports that she has never  smoked. She has never used smokeless tobacco. She reports current alcohol use. She reports that she does not use drugs.  Review of Systems:  Lipids:  She was switched from Crestor to Livalo because of her difficulty tolerating this because of joint pains . She is taking Livalo 4 mg  every day She will take Tylenol if she has any muscle or joint pains   Previously baseline LDL has been as high as 200 LDL much better compared to 9/20 but still above 100   Lab Results  Component Value Date   CHOL 190 08/01/2019   CHOL 235 (H) 10/08/2018   CHOL 260 (H) 02/15/2018   Lab Results  Component Value Date   HDL 77.20 08/01/2019   HDL 75.30 10/08/2018   HDL 77.20 02/15/2018   Lab Results  Component Value Date   LDLCALC 101 (H) 08/01/2019   LDLCALC 148 (H) 10/08/2018   LDLCALC 168 (H) 02/15/2018   Lab Results  Component Value Date   TRIG 57.0 08/01/2019   TRIG 58.0 10/08/2018   TRIG 70.0 02/15/2018   Lab Results  Component Value Date   CHOLHDL 2 08/01/2019   CHOLHDL 3 10/08/2018   CHOLHDL 3 02/15/2018   Lab Results  Component Value Date   LDLDIRECT 120.1 09/30/2013   LDLDIRECT 115.5 03/24/2013   LDLDIRECT 204.9 11/19/2012     She Catapres patch prescribed by her oncologist mostly for sweats and not for hypertension However she has not used this for a week as she has had less symptoms and only some night sweats at night  BP Readings from Last 3 Encounters:  12/03/19 130/76  10/21/19 126/77  08/05/19 140/70     Foot exam done in 6/21, normal exam  Eye exam: Usually regular She is [facility] by [facility]   She takes vitamin D 6000 units daily as advised by her PCP  Bone density was normal in 2016 done by PCP,  HYPERCALCEMIA: She appears to have  persistently high or high normal calcium levels since 6/19 Not on any HCTZ Calcium is back to normal  Lab Results  Component Value Date   CALCIUM 10.4 12/01/2019   CALCIUM 11.1 (H) 08/01/2019   CALCIUM 10.2 03/05/2019   CALCIUM 10.6 (H) 10/08/2018   CALCIUM 10.1 07/05/2018   CALCIUM 10.7 (H) 02/15/2018   CALCIUM 10.5 09/14/2017   Lab Results  Component Value Date   PTH 29 10/11/2018   CALCIUM 10.4 12/01/2019   CAION 1.09 (L) 01/10/2009      Examination:     BP 130/76   Pulse 75   Wt 178 lb 9.6 oz (81 kg)   SpO2 99%   BMI 32.15 kg/m   Body mass index is 32.15 kg/m.   Blood pressure improved on second measurement  ASSESSMENT/ PLAN:  Diabetes type 2 with mild obesity  See history of present illness for discussion of current diabetes management, blood sugar patterns and problems identified  Her A1c has slightly increased at 6.9  She is on Trulicity and Invokana  Previously not able to take more than 0.10 mg Trulicity because of nausea Even though her blood sugar looking fairly good her A1c is still relatively higher She is exercising regularly  However considering her age and comorbidities her level of control is adequate and she will stay on the same regimen   HYPERCHOLESTEROLEMIA:  To have follow-up on the next visit  HYPERCALCEMIA: Previously calcium level was 11.1 but now back to normal  Likely has primary hyperparathyroidism  even with normal PTH Needs periodic monitoring  Have recommended repeat bone density, to be checked by PCP   Follow-up in 3 months  There are no Patient Instructions on file for this visit.   [name] 12/03/2019, 11:33 AM

## 2020-01-05 ENCOUNTER — Other Ambulatory Visit: Payer: Self-pay | Admitting: Endocrinology

## 2020-02-19 ENCOUNTER — Telehealth: Payer: Self-pay | Admitting: Endocrinology

## 2020-02-19 MED ORDER — CANAGLIFLOZIN 300 MG PO TABS: ORAL_TABLET | ORAL | 1 refills | Status: DC

## 2020-02-19 NOTE — Telephone Encounter (Signed)
Patient asked that we send her Invokana to    [place], [place] Phone:  [phone]  Fax:  [phone]

## 2020-02-19 NOTE — Telephone Encounter (Signed)
Invokana sent to [facility].

## 2020-02-25 ENCOUNTER — Telehealth: Payer: Self-pay | Admitting: Endocrinology

## 2020-02-25 NOTE — Telephone Encounter (Signed)
Patient requests to be called at ph# [phone] re: Patient's insurance no longer covers Invokana.

## 2020-02-27 ENCOUNTER — Other Ambulatory Visit: Payer: Self-pay | Admitting: *Deleted

## 2020-02-27 MED ORDER — EMPAGLIFLOZIN 10 MG PO TABS: 10.0000 mg | ORAL_TABLET | Freq: Every day | ORAL | 1 refills | Status: DC

## 2020-03-01 NOTE — Telephone Encounter (Signed)
Jardiance was sent to her pharmacy to replace Invokana, patient is aware.

## 2020-04-01 DIAGNOSIS — M19041 Primary osteoarthritis, right hand: Secondary | ICD-10-CM | POA: Diagnosis not present

## 2020-04-06 ENCOUNTER — Other Ambulatory Visit (INDEPENDENT_AMBULATORY_CARE_PROVIDER_SITE_OTHER): Payer: Medicare Other

## 2020-04-06 ENCOUNTER — Other Ambulatory Visit: Payer: Self-pay

## 2020-04-06 DIAGNOSIS — E7800 Pure hypercholesterolemia, unspecified: Secondary | ICD-10-CM

## 2020-04-06 DIAGNOSIS — E1165 Type 2 diabetes mellitus with hyperglycemia: Secondary | ICD-10-CM

## 2020-04-06 LAB — COMPREHENSIVE METABOLIC PANEL
ALT: 20 U/L (ref 0–35)
AST: 18 U/L (ref 0–37)
Albumin: 4.2 g/dL (ref 3.5–5.2)
Alkaline Phosphatase: 63 U/L (ref 39–117)
BUN: 30 mg/dL — ABNORMAL HIGH (ref 6–23)
CO2: 29 mEq/L (ref 19–32)
Calcium: 10.3 mg/dL (ref 8.4–10.5)
Chloride: 102 mEq/L (ref 96–112)
Creatinine, Ser: 0.95 mg/dL (ref 0.40–1.20)
GFR: 58.9 mL/min — ABNORMAL LOW (ref >60.00)
Glucose, Bld: 116 mg/dL — ABNORMAL HIGH (ref 70–99)
Potassium: 4.3 mEq/L (ref 3.5–5.1)
Sodium: 138 mEq/L (ref 135–145)
Total Bilirubin: 0.5 mg/dL (ref 0.2–1.2)
Total Protein: 7.3 g/dL (ref 6.0–8.3)

## 2020-04-06 LAB — LIPID PANEL
Cholesterol: 222 mg/dL — ABNORMAL HIGH (ref 0–200)
HDL: 70 mg/dL (ref >39.00)
LDL Cholesterol: 136 mg/dL — ABNORMAL HIGH (ref 0–99)
NonHDL: 152.35
Total CHOL/HDL Ratio: 3
Triglycerides: 83 mg/dL (ref 0.0–149.0)
VLDL: 16.6 mg/dL (ref 0.0–40.0)

## 2020-04-06 LAB — HEMOGLOBIN A1C: Hgb A1c MFr Bld: 6.6 % — ABNORMAL HIGH (ref 4.6–6.5)

## 2020-04-08 ENCOUNTER — Ambulatory Visit: Payer: Medicare Other | Admitting: Endocrinology

## 2020-04-09 ENCOUNTER — Ambulatory Visit (INDEPENDENT_AMBULATORY_CARE_PROVIDER_SITE_OTHER): Payer: Medicare Other | Admitting: Endocrinology

## 2020-04-09 ENCOUNTER — Encounter: Payer: Self-pay | Admitting: Endocrinology

## 2020-04-09 ENCOUNTER — Other Ambulatory Visit: Payer: Self-pay

## 2020-04-09 VITALS — BP 128/68 | HR 80 | Ht 62.5 in | Wt 181.0 lb

## 2020-04-09 DIAGNOSIS — E1165 Type 2 diabetes mellitus with hyperglycemia: Secondary | ICD-10-CM | POA: Diagnosis not present

## 2020-04-09 DIAGNOSIS — E213 Hyperparathyroidism, unspecified: Secondary | ICD-10-CM

## 2020-04-09 DIAGNOSIS — R5383 Other fatigue: Secondary | ICD-10-CM | POA: Diagnosis not present

## 2020-04-09 DIAGNOSIS — E782 Mixed hyperlipidemia: Secondary | ICD-10-CM | POA: Diagnosis not present

## 2020-04-09 NOTE — Progress Notes (Signed)
Amanda [name] is a 75 y.o. female.             Reason for Appointment: Endocrinology follow-up   Initial diagnosis of diabetes:?  2007   History of Present Illness   Diagnosis: Type 2 DIABETES MELITUS     Previous history: She has had difficulty with blood sugar control initially because of limited tolerability with metformin and difficulty with weight loss. She did very well with starting Victoza which has helped her blood sugar control as well as some improvement in her obesity Her insurance company did not prefer Victoza and she was switched to Trulicity Subsequently her glipizide was stopped She was started on Invokana 100 mg daily in addition to her Trulicity 1.66 mg when her blood sugars were higher in 2017  Recent history:    Non-insulin hypoglycemic drugs:  Trulicity 0.63 mg weekly, Jardiance 10 mg daily   Current blood sugar patterns, management and problems identified:   Her A1c is improved at 6.6 compared to 6.9  Although she did have a couple of 170+ readings with a steroid injection in her shoulder her blood sugars recently appear to be still relatively higher  Her insurance recommended changing to [place] instead of [place] but for unknown reason she is only getting a 10 mg prescription since end of January  Weight appears to be gradually increasing  She thinks that she is still trying to walk on the treadmill or other walking fairly regularly  Blood sugar monitoring however has been sporadic but lab glucose was 116 fasting     Side effects from medications: Diarrhea from metformin.  Nausea from 1.5 mg Trulicity         Monitors blood glucose:  less than Once a day.    Glucometer: One Touch ultra 2   PRE-MEAL  mornings  midday  evenings Bedtime Overall  Glucose range:  131-178  121-138  148, 179  136-145  121-179  Mean/median:     142   Prior readings:   PRE-MEAL  morning Lunch Dinner Bedtime Overall  Glucose range:  109-123    112-152  104-152   Mean/median:      123     Physical activity: exercise: walking on treadmill 4 days/7 upto 60 min            Wt Readings from Last 3 Encounters:  04/09/20 181 lb (82.1 kg)  12/03/19 178 lb 9.6 oz (81 kg)  10/20/19 175 lb (79.4 kg)    LABS:  Lab Results  Component Value Date   HGBA1C 6.6 (H) 04/06/2020   HGBA1C 6.9 (H) 12/01/2019   HGBA1C 6.9 (H) 08/01/2019   Lab Results  Component Value Date   MICROALBUR 1.0 08/01/2019   LDLCALC 136 (H) 04/06/2020   CREATININE 0.95 04/06/2020   Other active problems discussed today: See review of systems   Lab on 04/06/2020  Component Date Value Ref Range Status   Cholesterol 04/06/2020 222* 0 - 200 mg/dL Final   ATP III Classification       Desirable:  < 200 mg/dL               Borderline High:  200 - 239 mg/dL          High:  > = 240 mg/dL   Triglycerides 04/06/2020 83.0  0.0 - 149.0 mg/dL Final   Normal:  <150 mg/dLBorderline High:  150 - 199 mg/dL   HDL 04/06/2020 70.00  >39.00 mg/dL Final   VLDL 04/06/2020 16.6  0.0 - 40.0 mg/dL Final   LDL Cholesterol 04/06/2020 136* 0 - 99 mg/dL Final   Total CHOL/HDL Ratio 04/06/2020 3   Final                  Men          Women1/2 Average Risk     3.4          3.3Average Risk          5.0          4.42X Average Risk          9.6          7.13X Average Risk          15.0          11.0                       NonHDL 04/06/2020 152.35   Final   NOTE:  Non-HDL goal should be 30 mg/dL higher than patient's LDL goal (i.e. LDL goal of < 70 mg/dL, would have non-HDL goal of < 100 mg/dL)   Sodium 04/06/2020 138  135 - 145 mEq/L Final   Potassium 04/06/2020 4.3  3.5 - 5.1 mEq/L Final   Chloride 04/06/2020 102  96 - 112 mEq/L Final   CO2 04/06/2020 29  19 - 32 mEq/L Final   Glucose, Bld 04/06/2020 116* 70 - 99 mg/dL Final   BUN 04/06/2020 30* 6 - 23 mg/dL Final   Creatinine, Ser 04/06/2020 0.95  0.40 - 1.20 mg/dL Final   Total Bilirubin 04/06/2020 0.5  0.2 - 1.2 mg/dL Final   Alkaline  Phosphatase 04/06/2020 63  39 - 117 U/L Final   AST 04/06/2020 18  0 - 37 U/L Final   ALT 04/06/2020 20  0 - 35 U/L Final   Total Protein 04/06/2020 7.3  6.0 - 8.3 g/dL Final   Albumin 04/06/2020 4.2  3.5 - 5.2 g/dL Final   GFR 04/06/2020 58.90* >60.00 mL/min Final   Calculated using the CKD-EPI Creatinine Equation (2021)   Calcium 04/06/2020 10.3  8.4 - 10.5 mg/dL Final   Hgb A1c MFr Bld 04/06/2020 6.6* 4.6 - 6.5 % Final   Glycemic Control Guidelines for People with Diabetes:Non Diabetic:  <6%Goal of Therapy: <7%Additional Action Suggested:  >8%     Allergies as of 04/09/2020      Reactions   Statins Other (See Comments)   Muscle pain. Pt able to take Crestor but not everyday.   Meperidine Hcl Rash      Medication List       Accurate as of April 09, 2020  1:12 PM. If you have any questions, ask your nurse or doctor.        anastrozole 1 MG tablet Commonly known as: ARIMIDEX Take 1 tablet (1 mg total) by mouth daily.   canagliflozin 300 MG Tabs tablet Commonly known as: Invokana TAKE 1 TABLET BY MOUTH DAILY BEFORE FIRST MEAL OF THE DAY   cloNIDine 0.1 mg/24hr patch Commonly known as: CATAPRES - Dosed in mg/24 hr PLACE 1 PATCH ON THE SKIN ONCE A WEEK   empagliflozin 10 MG Tabs tablet Commonly known as: Jardiance Take 1 tablet (10 mg total) by mouth daily. Take 1 tablet daily  TO REPLACE INVOKANA PER INSURANCE   glucose blood test strip Use Onetouch Ultra test strips as instructed to check blood sugar once daily.   Livalo 4 MG Tabs Generic drug: Pitavastatin Calcium  Take one tablet daily   olopatadine 0.1 % ophthalmic solution Commonly known as: PATANOL Instill 1 drop in left eye twice daily   ONE TOUCH ULTRA 2 w/Device Kit 1 each by Does not apply route daily. Use daily to check blood sugar.   Trulicity 4.01 UU/7.2ZD Sopn Generic drug: Dulaglutide INJECT 0.75 MG UNDER THE SKIN ONCE A WEEK   venlafaxine 37.5 MG tablet Commonly known as: EFFEXOR Take 1  tablet (37.5 mg total) by mouth 2 (two) times daily.   Vitamin D3 50 MCG (2000 UT) Tabs Take 6,000 Units by mouth daily.       Allergies:  Allergies  Allergen Reactions   Statins Other (See Comments)    Muscle pain. Pt able to take Crestor but not everyday.   Meperidine Hcl Rash    Past Medical History:  Diagnosis Date   Arthritis    Breast cancer ([place])    Breast cancer of upper-outer quadrant of right female breast ([place]) 03/24/2015   Carpal tunnel syndrome    Diabetes mellitus without complication ([place])    type 2   GERD (gastroesophageal reflux disease)    Pt takes OTC when needed.   Hot flashes     Past Surgical History:  Procedure Laterality Date   ABDOMINAL HYSTERECTOMY     APPENDECTOMY     COLONOSCOPY     ESOPHAGOGASTRODUODENOSCOPY     EYE SURGERY Bilateral    Cataracts removed   JOINT REPLACEMENT     RADIOACTIVE SEED GUIDED PARTIAL MASTECTOMY WITH AXILLARY SENTINEL LYMPH NODE BIOPSY Right 04/16/2015   Procedure: RADIOACTIVE SEED GUIDED PARTIAL MASTECTOMY WITH AXILLARY SENTINEL LYMPH NODE BIOPSY;  Surgeon: Alphonsa Overall, MD;  Location: [facility];  Service: General;  Laterality: Right;   SHOULDER SURGERY Right    TONSILLECTOMY     TOTAL KNEE ARTHROPLASTY Right 07/18/2013   DR GRAVES   TOTAL KNEE ARTHROPLASTY Right 07/18/2013   Procedure: TOTAL KNEE ARTHROPLASTY;  Surgeon: Alta Corning, MD;  Location: [facility];  Service: Orthopedics;  Laterality: Right;   TOTAL KNEE ARTHROPLASTY Left 06/15/2017   Procedure: LEFT TOTAL KNEE ARTHROPLASTY;  Surgeon: Dorna Leitz, MD;  Location[facility];  Service: Orthopedics;  Laterality: Left;    No family history on file.  Social History:  reports that she has never smoked. She has never used smokeless tobacco. She reports current alcohol use. She reports that she does not use drugs.  Review of Systems:  Lipids:  She was switched from Crestor to Livalo because of her difficulty tolerating this  because of joint pains . She is taking Livalo 4 mg  4-5/7 days, previously was likely taking it more often She will take Tylenol if she has any muscle or joint pains   Previously baseline LDL has been as high as 200 LDL had improved but is back up at 136   Lab Results  Component Value Date   CHOL 222 (H) 04/06/2020   CHOL 190 08/01/2019   CHOL 235 (H) 10/08/2018   Lab Results  Component Value Date   HDL 70.00 04/06/2020   HDL 77.20 08/01/2019   HDL 75.30 10/08/2018   Lab Results  Component Value Date   LDLCALC 136 (H) 04/06/2020   LDLCALC 101 (H) 08/01/2019   LDLCALC 148 (H) 10/08/2018   Lab Results  Component Value Date   TRIG 83.0 04/06/2020   TRIG 57.0 08/01/2019   TRIG 58.0 10/08/2018   Lab Results  Component Value Date   CHOLHDL 3 04/06/2020  CHOLHDL 2 08/01/2019   CHOLHDL 3 10/08/2018   Lab Results  Component Value Date   LDLDIRECT 120.1 09/30/2013   LDLDIRECT 115.5 03/24/2013   LDLDIRECT 204.9 11/19/2012     Catapres patch prescribed by her oncologist mostly for sweating episodes and not for hypertension She is taking this most of the time now as she has symptoms without it  BP Readings from Last 3 Encounters:  04/09/20 128/68  12/03/19 130/76  10/21/19 126/77     Foot exam done in 6/21, normal exam  Eye exam: Usually regular She is [facility]  She takes vitamin D 6000 units daily as advised by her PCP  Bone density was normal in 2016 done by PCP,  HYPERCALCEMIA: She has had persistently high or high normal calcium levels since 6/19 Not on any HCTZ Calcium is as follows  Lab Results  Component Value Date   CALCIUM 10.3 04/06/2020   CALCIUM 10.4 12/01/2019   CALCIUM 11.1 (H) 08/01/2019   CALCIUM 10.2 03/05/2019   CALCIUM 10.6 (H) 10/08/2018   CALCIUM 10.1 07/05/2018   CALCIUM 10.7 (H) 02/15/2018   Lab Results  Component Value Date   PTH 29 10/11/2018   CALCIUM 10.3 04/06/2020   CAION 1.09 (L) 01/10/2009       Examination:     BP 128/68    Pulse 80    Ht 5' 2.5" (1.588 m)    Wt 181 lb (82.1 kg)    SpO2 98%    BMI 32.58 kg/m   Body mass index is 32.58 kg/m.     ASSESSMENT/ PLAN:  Diabetes type 2 with mild obesity  See history of present illness for discussion of current diabetes management, blood sugar patterns and problems identified  Her A1c has improved slightly at 6.6  She is on Trulicity and now Jardiance instead of Invokana Not clear why she has had a tendency to weight gain even though she is exercising Recently she thinks she is doing well with her diet Blood sugar monitoring is somewhat inadequate  Although she thinks she is having some fatigue from Jardiance this is unlikely as she had no side effects from [place] She will go up to 20 mg only Jardiance and prescription will be sent for 25 mg tablets Encouraged her to check blood sugars more often alternating fasting and after meals  HYPERCHOLESTEROLEMIA:  She is not able to take Livalo every day because of muscle aches and LDL has fluctuated  HYPERCALCEMIA: Likely from mild hyperparathyroidism and stable  Fatigue: If this persists she can follow-up with her PCP  Follow-up in 3 months  There are no Patient Instructions on file for this visit.   [name] 04/09/2020, 1:12 PM

## 2020-04-09 NOTE — Patient Instructions (Signed)
Take 2 Jardiance daily   Check blood sugars on waking up 2-3 days a week  Also check blood sugars about 2 hours after meals and do this after different meals by rotation  Recommended blood sugar levels on waking up are 90-130 and about 2 hours after meal is 130-160  Please bring your blood sugar monitor to each visit, thank you

## 2020-04-23 ENCOUNTER — Telehealth: Payer: Self-pay | Admitting: Endocrinology

## 2020-04-23 DIAGNOSIS — J45909 Unspecified asthma, uncomplicated: Secondary | ICD-10-CM | POA: Diagnosis not present

## 2020-04-23 DIAGNOSIS — E119 Type 2 diabetes mellitus without complications: Secondary | ICD-10-CM | POA: Diagnosis not present

## 2020-04-23 DIAGNOSIS — E1169 Type 2 diabetes mellitus with other specified complication: Secondary | ICD-10-CM | POA: Diagnosis not present

## 2020-04-23 DIAGNOSIS — K219 Gastro-esophageal reflux disease without esophagitis: Secondary | ICD-10-CM | POA: Diagnosis not present

## 2020-04-23 DIAGNOSIS — I10 Essential (primary) hypertension: Secondary | ICD-10-CM | POA: Diagnosis not present

## 2020-04-23 DIAGNOSIS — E782 Mixed hyperlipidemia: Secondary | ICD-10-CM | POA: Diagnosis not present

## 2020-04-23 DIAGNOSIS — C50911 Malignant neoplasm of unspecified site of right female breast: Secondary | ICD-10-CM | POA: Diagnosis not present

## 2020-04-23 NOTE — Telephone Encounter (Signed)
Patient called and advised that Dr Dwyane Dee told her at the last visit that she wanted her to take Jardiance 25 MG.  Patient is requesting updated prescription to be sent to [facility] on [address] back [phone]

## 2020-04-29 MED ORDER — EMPAGLIFLOZIN 25 MG PO TABS: 25.0000 mg | ORAL_TABLET | Freq: Every day | ORAL | 3 refills | Status: DC

## 2020-04-29 NOTE — Telephone Encounter (Signed)
Please advise Dr Ronnie Derby patient

## 2020-04-29 NOTE — Telephone Encounter (Signed)
Ok, I have sent a prescription to your pharmacy, to change

## 2020-04-29 NOTE — Telephone Encounter (Signed)
Spoke with pt to let her know that [facility] sent her in a new Rx.

## 2020-05-03 ENCOUNTER — Other Ambulatory Visit: Payer: Self-pay | Admitting: Oncology

## 2020-05-04 ENCOUNTER — Telehealth: Payer: Self-pay

## 2020-05-04 NOTE — Telephone Encounter (Signed)
Patient called in needing a Rx sent in     glucose blood test strip  [facility] [phone] - [place], [address] AT [address]

## 2020-05-05 ENCOUNTER — Other Ambulatory Visit: Payer: Self-pay | Admitting: *Deleted

## 2020-05-05 DIAGNOSIS — E1165 Type 2 diabetes mellitus with hyperglycemia: Secondary | ICD-10-CM

## 2020-05-05 MED ORDER — GLUCOSE BLOOD VI STRP: ORAL_STRIP | 2 refills | Status: DC

## 2020-05-05 NOTE — Telephone Encounter (Signed)
Sent Rx test strip--[facility].

## 2020-06-07 ENCOUNTER — Other Ambulatory Visit: Payer: Self-pay | Admitting: *Deleted

## 2020-06-07 ENCOUNTER — Telehealth: Payer: Self-pay | Admitting: Endocrinology

## 2020-06-07 MED ORDER — DAPAGLIFLOZIN PROPANEDIOL 5 MG PO TABS: 5.0000 mg | ORAL_TABLET | Freq: Every day | ORAL | 3 refills | Status: DC

## 2020-06-07 NOTE — Telephone Encounter (Signed)
Jardiance does not cause shortness of breath.  Since she has been on Jardiance since at least January do not think this is causing her rash also.  She will need to see her PCP.  Also need to be scheduled for follow-up appointment in June with labs

## 2020-06-07 NOTE — Telephone Encounter (Signed)
She can try 5 mg [place] instead of Jardiance.  However needs to schedule follow-up here and also see her PCP

## 2020-06-07 NOTE — Telephone Encounter (Signed)
Called and left a message for pt to call back to discuss possible side effects.

## 2020-06-07 NOTE — Telephone Encounter (Signed)
Noted, Patient is aware and did ask to switch to the Farxiga, rx has been sent to her pharmacy.

## 2020-06-07 NOTE — Telephone Encounter (Signed)
Pt called back and advised she has not started any other medications in years Jardiance being the only new medication. She advised she had not been feeling well on the 10 MG dosage and recently it was increased to 25 mg and she is feeling fatigued tired and just not herself since the switch. Recently the shortness of breath and rash have caused concern. Pt was advised s.o.b is not caused by Jardiance and she should follow up with PCP. Pt will follow up but is concerned if she should continue Jardiance.

## 2020-06-07 NOTE — Telephone Encounter (Signed)
Pt is thinking that she is having side effects from  empagliflozin (JARDIANCE) 25 MG TABS   With Shortness of breath, with rash. Patient would like a call back as soon as possible.

## 2020-06-23 DIAGNOSIS — E119 Type 2 diabetes mellitus without complications: Secondary | ICD-10-CM | POA: Diagnosis not present

## 2020-06-23 DIAGNOSIS — I10 Essential (primary) hypertension: Secondary | ICD-10-CM | POA: Diagnosis not present

## 2020-06-23 DIAGNOSIS — C50911 Malignant neoplasm of unspecified site of right female breast: Secondary | ICD-10-CM | POA: Diagnosis not present

## 2020-06-23 DIAGNOSIS — J45909 Unspecified asthma, uncomplicated: Secondary | ICD-10-CM | POA: Diagnosis not present

## 2020-06-23 DIAGNOSIS — E782 Mixed hyperlipidemia: Secondary | ICD-10-CM | POA: Diagnosis not present

## 2020-06-23 DIAGNOSIS — K219 Gastro-esophageal reflux disease without esophagitis: Secondary | ICD-10-CM | POA: Diagnosis not present

## 2020-06-23 DIAGNOSIS — E1169 Type 2 diabetes mellitus with other specified complication: Secondary | ICD-10-CM | POA: Diagnosis not present

## 2020-06-28 DIAGNOSIS — L309 Dermatitis, unspecified: Secondary | ICD-10-CM | POA: Diagnosis not present

## 2020-07-12 DIAGNOSIS — Z1231 Encounter for screening mammogram for malignant neoplasm of breast: Secondary | ICD-10-CM | POA: Diagnosis not present

## 2020-07-15 ENCOUNTER — Other Ambulatory Visit (INDEPENDENT_AMBULATORY_CARE_PROVIDER_SITE_OTHER): Payer: Medicare Other

## 2020-07-15 ENCOUNTER — Other Ambulatory Visit: Payer: Self-pay

## 2020-07-15 DIAGNOSIS — E1165 Type 2 diabetes mellitus with hyperglycemia: Secondary | ICD-10-CM

## 2020-07-15 DIAGNOSIS — E782 Mixed hyperlipidemia: Secondary | ICD-10-CM

## 2020-07-15 DIAGNOSIS — R5383 Other fatigue: Secondary | ICD-10-CM

## 2020-07-15 LAB — COMPREHENSIVE METABOLIC PANEL
ALT: 15 U/L (ref 0–35)
AST: 18 U/L (ref 0–37)
Albumin: 4 g/dL (ref 3.5–5.2)
Alkaline Phosphatase: 57 U/L (ref 39–117)
BUN: 21 mg/dL (ref 6–23)
CO2: 27 mEq/L (ref 19–32)
Calcium: 10.2 mg/dL (ref 8.4–10.5)
Chloride: 107 mEq/L (ref 96–112)
Creatinine, Ser: 0.8 mg/dL (ref 0.40–1.20)
GFR: 72.25 mL/min (ref >60.00)
Glucose, Bld: 116 mg/dL — ABNORMAL HIGH (ref 70–99)
Potassium: 4.6 mEq/L (ref 3.5–5.1)
Sodium: 139 mEq/L (ref 135–145)
Total Bilirubin: 0.5 mg/dL (ref 0.2–1.2)
Total Protein: 7.3 g/dL (ref 6.0–8.3)

## 2020-07-15 LAB — URINALYSIS, ROUTINE W REFLEX MICROSCOPIC
Bilirubin Urine: NEGATIVE
Hgb urine dipstick: NEGATIVE
Ketones, ur: NEGATIVE
Leukocytes,Ua: NEGATIVE
Nitrite: NEGATIVE
RBC / HPF: NONE SEEN (ref 0–?)
Specific Gravity, Urine: 1.02 (ref 1.000–1.030)
Total Protein, Urine: NEGATIVE
Urine Glucose: NEGATIVE
Urobilinogen, UA: 0.2 (ref 0.0–1.0)
pH: 5.5 (ref 5.0–8.0)

## 2020-07-15 LAB — MICROALBUMIN / CREATININE URINE RATIO
Creatinine,U: 138.3 mg/dL
Microalb Creat Ratio: 1 mg/g (ref 0.0–30.0)
Microalb, Ur: 1.3 mg/dL (ref 0.0–1.9)

## 2020-07-15 LAB — LIPID PANEL
Cholesterol: 219 mg/dL — ABNORMAL HIGH (ref 0–200)
HDL: 55.7 mg/dL (ref >39.00)
LDL Cholesterol: 144 mg/dL — ABNORMAL HIGH (ref 0–99)
NonHDL: 163.43
Total CHOL/HDL Ratio: 4
Triglycerides: 96 mg/dL (ref 0.0–149.0)
VLDL: 19.2 mg/dL (ref 0.0–40.0)

## 2020-07-15 LAB — HEMOGLOBIN A1C: Hgb A1c MFr Bld: 6.8 % — ABNORMAL HIGH (ref 4.6–6.5)

## 2020-07-15 LAB — TSH: TSH: 1.04 u[IU]/mL (ref 0.35–4.50)

## 2020-07-16 ENCOUNTER — Other Ambulatory Visit: Payer: Self-pay | Admitting: Endocrinology

## 2020-07-20 ENCOUNTER — Other Ambulatory Visit: Payer: Self-pay

## 2020-07-20 ENCOUNTER — Ambulatory Visit (INDEPENDENT_AMBULATORY_CARE_PROVIDER_SITE_OTHER): Payer: Medicare Other | Admitting: Endocrinology

## 2020-07-20 VITALS — BP 140/80 | HR 63 | Ht 62.5 in | Wt 182.4 lb

## 2020-07-20 DIAGNOSIS — E7800 Pure hypercholesterolemia, unspecified: Secondary | ICD-10-CM

## 2020-07-20 DIAGNOSIS — E119 Type 2 diabetes mellitus without complications: Secondary | ICD-10-CM | POA: Diagnosis not present

## 2020-07-20 NOTE — Progress Notes (Signed)
Amanda [name] is a 75 y.o. female.             Reason for Appointment: Endocrinology follow-up   Initial diagnosis of diabetes:?  2007   History of Present Illness   Diagnosis: Type 2 DIABETES MELITUS     Previous history: She has had difficulty with blood sugar control initially because of limited tolerability with metformin and difficulty with weight loss. She did very well with starting Victoza which has helped her blood sugar control as well as some improvement in her obesity Her insurance company did not prefer Victoza and she was switched to Trulicity Subsequently her glipizide was stopped She was started on Invokana 100 mg daily in addition to her Trulicity 5.17 mg when her blood sugars were higher in 2017  Recent history:    Non-insulin hypoglycemic drugs:  Trulicity 6.16 mg weekly, Farxiga 5 mg daily   Current blood sugar patterns, management and problems identified:  Her A1c is slightly higher at 6.8 compared to 6.6 She had called on 5/2 stating that she is having a rash and some shortness of breath which she felt was from [place] However she now says that she was having problems with nausea from the Jardiance that was persisting all day With switching to [place] she does not complain of nausea except very transiently from Trulicity She did not bring her meter today but she thinks her blood sugars are mostly near normal Weight appears to be about the same, previously increasing She said that usually is eating only a snack at breakfast time She thinks that she is still trying to walk on the treadmill usually 4 days a week unless she has other issues Blood sugar monitoring usually has been sporadic, lab glucose was 116 fasting     Side effects from medications: Diarrhea from metformin.  Nausea from 1.5 mg Trulicity         Monitors blood glucose:  less than Once a day.    Glucometer: One Touch ultra 2  By recall range am 113, 97, pc 125.  Go tomorrow  PRE-MEAL   mornings  midday  evenings Bedtime Overall  Glucose range:  131-178  121-138  148, 179  136-145  121-179  Mean/median:     142   Prior readings:   PRE-MEAL  morning Lunch Dinner Bedtime Overall  Glucose range:  109-123    112-152  104-152  Mean/median:      123     Physical activity: exercise: walking on treadmill 1-4 days/7 upto 60 min            Wt Readings from Last 3 Encounters:  07/20/20 182 lb 6.4 oz (82.7 kg)  04/09/20 181 lb (82.1 kg)  12/03/19 178 lb 9.6 oz (81 kg)    LABS:  Lab Results  Component Value Date   HGBA1C 6.8 (H) 07/15/2020   HGBA1C 6.6 (H) 04/06/2020   HGBA1C 6.9 (H) 12/01/2019   Lab Results  Component Value Date   MICROALBUR 1.3 07/15/2020   LDLCALC 144 (H) 07/15/2020   CREATININE 0.80 07/15/2020   Other active problems discussed today: See review of systems   Lab on 07/15/2020  Component Date Value Ref Range Status   TSH 07/15/2020 1.04  0.35 - 4.50 uIU/mL Final   Color, Urine 07/15/2020 YELLOW  Yellow;Lt. Yellow;Straw;Dark Yellow;Amber;Green;Red;Brown Final   APPearance 07/15/2020 CLEAR  Clear;Turbid;Slightly Cloudy;Cloudy Final   Specific Gravity, Urine 07/15/2020 1.020  1.000 - 1.030 Final   pH 07/15/2020 5.5  5.0 - 8.0 Final   Total Protein, Urine 07/15/2020 NEGATIVE  Negative Final   Urine Glucose 07/15/2020 NEGATIVE  Negative Final   Ketones, ur 07/15/2020 NEGATIVE  Negative Final   Bilirubin Urine 07/15/2020 NEGATIVE  Negative Final   Hgb urine dipstick 07/15/2020 NEGATIVE  Negative Final   Urobilinogen, UA 07/15/2020 0.2  0.0 - 1.0 Final   Leukocytes,Ua 07/15/2020 NEGATIVE  Negative Final   Nitrite 07/15/2020 NEGATIVE  Negative Final   WBC, UA 07/15/2020 0-2/hpf  0-2/hpf Final   RBC / HPF 07/15/2020 none seen  0-2/hpf Final   Squamous Epithelial / LPF 07/15/2020 Rare(0-4/hpf)  Rare(0-4/hpf) Final   Bacteria, UA 07/15/2020 Rare(<10/hpf) (A) None Final   Cholesterol 07/15/2020 219 (A) 0 - 200 mg/dL Final   ATP III Classification        Desirable:  < 200 mg/dL               Borderline High:  200 - 239 mg/dL          High:  > = 240 mg/dL   Triglycerides 07/15/2020 96.0  0.0 - 149.0 mg/dL Final   Normal:  <150 mg/dLBorderline High:  150 - 199 mg/dL   HDL 07/15/2020 55.70  >39.00 mg/dL Final   VLDL 07/15/2020 19.2  0.0 - 40.0 mg/dL Final   LDL Cholesterol 07/15/2020 144 (A) 0 - 99 mg/dL Final   Total CHOL/HDL Ratio 07/15/2020 4   Final                  Men          Women1/2 Average Risk     3.4          3.3Average Risk          5.0          4.42X Average Risk          9.6          7.13X Average Risk          15.0          11.0                       NonHDL 07/15/2020 163.43   Final   NOTE:  Non-HDL goal should be 30 mg/dL higher than patient's LDL goal (i.e. LDL goal of < 70 mg/dL, would have non-HDL goal of < 100 mg/dL)   Microalb, Ur 07/15/2020 1.3  0.0 - 1.9 mg/dL Final   Creatinine,U 07/15/2020 138.3  mg/dL Final   Microalb Creat Ratio 07/15/2020 1.0  0.0 - 30.0 mg/g Final   Sodium 07/15/2020 139  135 - 145 mEq/L Final   Potassium 07/15/2020 4.6  3.5 - 5.1 mEq/L Final   Chloride 07/15/2020 107  96 - 112 mEq/L Final   CO2 07/15/2020 27  19 - 32 mEq/L Final   Glucose, Bld 07/15/2020 116 (A) 70 - 99 mg/dL Final   BUN 07/15/2020 21  6 - 23 mg/dL Final   Creatinine, Ser 07/15/2020 0.80  0.40 - 1.20 mg/dL Final   Total Bilirubin 07/15/2020 0.5  0.2 - 1.2 mg/dL Final   Alkaline Phosphatase 07/15/2020 57  39 - 117 U/L Final   AST 07/15/2020 18  0 - 37 U/L Final   ALT 07/15/2020 15  0 - 35 U/L Final   Total Protein 07/15/2020 7.3  6.0 - 8.3 g/dL Final   Albumin 07/15/2020 4.0  3.5 - 5.2 g/dL Final   GFR 07/15/2020  72.25  >60.00 mL/min Final   Calculated using the CKD-EPI Creatinine Equation (2021)   Calcium 07/15/2020 10.2  8.4 - 10.5 mg/dL Final   Hgb A1c MFr Bld 07/15/2020 6.8 (A) 4.6 - 6.5 % Final   Glycemic Control Guidelines for People with Diabetes:Non Diabetic:  <6%Goal of Therapy: <7%Additional Action Suggested:   >8%     Allergies as of 07/20/2020       Reactions   Statins Other (See Comments)   Muscle pain. Pt able to take Crestor but not everyday.   Meperidine Hcl Rash        Medication List        Accurate as of July 20, 2020  9:13 AM. If you have any questions, ask your nurse or doctor.          anastrozole 1 MG tablet Commonly known as: ARIMIDEX Take 1 tablet (1 mg total) by mouth daily.   cloNIDine 0.1 mg/24hr patch Commonly known as: CATAPRES - Dosed in mg/24 hr APPLY 1 PATCH EXTERNALLY TO THE SKIN EVERY WEEK   dapagliflozin propanediol 5 MG Tabs tablet Commonly known as: Farxiga Take 1 tablet (5 mg total) by mouth daily.   empagliflozin 25 MG Tabs tablet Commonly known as: Jardiance Take 1 tablet (25 mg total) by mouth daily before breakfast.   glucose blood test strip Use Onetouch Ultra test strips as instructed to check blood sugar once daily.   Livalo 4 MG Tabs Generic drug: Pitavastatin Calcium TAKE 1 TABLET BY MOUTH DAILY   olopatadine 0.1 % ophthalmic solution Commonly known as: PATANOL Instill 1 drop in left eye twice daily   ONE TOUCH ULTRA 2 w/Device Kit 1 each by Does not apply route daily. Use daily to check blood sugar.   Trulicity 5.95 ZX/6.7SW Sopn Generic drug: Dulaglutide INJECT 0.75 MG UNDER THE SKIN ONCE A WEEK   venlafaxine 37.5 MG tablet Commonly known as: EFFEXOR Take 1 tablet (37.5 mg total) by mouth 2 (two) times daily.   Vitamin D3 50 MCG (2000 UT) Tabs Take 6,000 Units by mouth daily.        Allergies:  Allergies  Allergen Reactions   Statins Other (See Comments)    Muscle pain. Pt able to take Crestor but not everyday.   Meperidine Hcl Rash    Past Medical History:  Diagnosis Date   Arthritis    Breast cancer ([place])    Breast cancer of upper-outer quadrant of right female breast ([place]) 03/24/2015   Carpal tunnel syndrome    Diabetes mellitus without complication ([place])    type 2   GERD (gastroesophageal reflux  disease)    Pt takes OTC when needed.   Hot flashes     Past Surgical History:  Procedure Laterality Date   ABDOMINAL HYSTERECTOMY     APPENDECTOMY     COLONOSCOPY     ESOPHAGOGASTRODUODENOSCOPY     EYE SURGERY Bilateral    Cataracts removed   JOINT REPLACEMENT     RADIOACTIVE SEED GUIDED PARTIAL MASTECTOMY WITH AXILLARY SENTINEL LYMPH NODE BIOPSY Right 04/16/2015   Procedure: RADIOACTIVE SEED GUIDED PARTIAL MASTECTOMY WITH AXILLARY SENTINEL LYMPH NODE BIOPSY;  Surgeon: Alphonsa Overall, MD;  Location: [facility];  Service: General;  Laterality: Right;   SHOULDER SURGERY Right    TONSILLECTOMY     TOTAL KNEE ARTHROPLASTY Right 07/18/2013   DR GRAVES   TOTAL KNEE ARTHROPLASTY Right 07/18/2013   Procedure: TOTAL KNEE ARTHROPLASTY;  Surgeon: Alta Corning, MD;  Location: [facility]  OR;  Service: Orthopedics;  Laterality: Right;   TOTAL KNEE ARTHROPLASTY Left 06/15/2017   Procedure: LEFT TOTAL KNEE ARTHROPLASTY;  Surgeon: Dorna Leitz, MD;  Location: [facility] OR[facility];  Service: Orthopedics;  Laterality: Left;    No family history on file.  Social History:  reports that she has never smoked. She has never used smokeless tobacco. She reports current alcohol use. She reports that she does not use drugs.  Review of Systems:  Lipids:  She was switched from Crestor to Livalo because of her difficulty tolerating this because of joint pains . She is taking Livalo 4 mg  4/7 days, usually not missing any doses She will take Tylenol if she has any muscle or joint pains   Previously baseline LDL has been as high as 200 LDL had improved but is gradually increasing She thinks recently she is watching her fats   Lab Results  Component Value Date   CHOL 219 (H) 07/15/2020   CHOL 222 (H) 04/06/2020   CHOL 190 08/01/2019   Lab Results  Component Value Date   HDL 55.70 07/15/2020   HDL 70.00 04/06/2020   HDL 77.20 08/01/2019   Lab Results  Component Value Date   LDLCALC 144 (H) 07/15/2020    LDLCALC 136 (H) 04/06/2020   LDLCALC 101 (H) 08/01/2019   Lab Results  Component Value Date   TRIG 96.0 07/15/2020   TRIG 83.0 04/06/2020   TRIG 57.0 08/01/2019   Lab Results  Component Value Date   CHOLHDL 4 07/15/2020   CHOLHDL 3 04/06/2020   CHOLHDL 2 08/01/2019   Lab Results  Component Value Date   LDLDIRECT 120.1 09/30/2013   LDLDIRECT 115.5 03/24/2013   LDLDIRECT 204.9 11/19/2012     Catapres patch prescribed by her oncologist mostly for sweating episodes and not for hypertension She is taking this most of the time now as she has symptoms without it  BP Readings from Last 3 Encounters:  07/20/20 140/80  04/09/20 128/68  12/03/19 130/76     Foot exam done in 6/21, normal exam  Eye exam: Usually regular She is seen by [facility] center  She takes vitamin D 6000 units daily as advised by her PCP  Bone density was normal in 2016 done by PCP,  HYPERCALCEMIA: She has had persistently high or high normal calcium levels since 6/19 Not on any HCTZ Calcium is as follows  Lab Results  Component Value Date   CALCIUM 10.2 07/15/2020   CALCIUM 10.3 04/06/2020   CALCIUM 10.4 12/01/2019   CALCIUM 11.1 (H) 08/01/2019   CALCIUM 10.2 03/05/2019   CALCIUM 10.6 (H) 10/08/2018   CALCIUM 10.1 07/05/2018   Lab Results  Component Value Date   PTH 29 10/11/2018   CALCIUM 10.2 07/15/2020   CAION 1.09 (L) 01/10/2009      Examination:     BP 140/80 (BP Location: Left Arm, Patient Position: Sitting, Cuff Size: Normal)   Pulse 63   Ht 5' 2.5" (1.588 m)   Wt 182 lb 6.4 oz (82.7 kg)   SpO2 96%   BMI 32.83 kg/m   Body mass index is 32.83 kg/m.     ASSESSMENT/ PLAN:  Diabetes type 2 with mild obesity  See history of present illness for discussion of current diabetes management, blood sugar patterns and problems identified  Her A1c has not changed much at 6.8  She is on Trulicity and now [place] instead of [place]ard[place]  Since she is tolerating [place] and her  blood sugars  appear to be generally well controlled she can continue this with her 4.56 mg Trulicity weekly She thinks she can tolerate the mild intermittent nausea from Trulicity She does need to check her sugars regularly especially after meals and call if consistently high May consider increasing Farxiga if she has a rise in A1c again  Urine microalbumin normal  HYPERCHOLESTEROLEMIA:  Inadequately controlled because of difficulty tolerating statins She was given a list of high saturated fat foods to avoid and if her lipids are still not well controlled we will add Zetia  Fatigue: Reportedly improved after stopping Jardiance, thyroid levels normal  Follow-up in 4 months to make sure she is consistently having good control  There are no Patient Instructions on file for this visit.   [name] 07/20/2020, 9:13 AM

## 2020-07-21 NOTE — Progress Notes (Signed)
Amanda [name]  [phone] [phone]     ID: Amanda [name] DOB: December 07, 1945  MR#: [id number]  XTK#:[id number]  Patient Care Team: Antony Contras, MD as PCP - General (Family Medicine) Sylvan Cheese, NP as Nurse Practitioner (Hematology and Oncology) Catera Hankins, Virgie Dad, MD as Consulting Physician (Oncology) Thea Silversmith, MD as Consulting Physician (Radiation Oncology) Alphonsa Overall, MD as Consulting Physician (General Surgery) [name], MD as Consulting Physician (Endocrinology) GYN: OTHER MD:  CHIEF COMPLAINT: estrogen receptor positive breast cancer  CURRENT TREATMENT: Completing 5 years of anastrozole   INTERVAL HISTORY:  Amanda [name] returns today for follow-up of her estrogen receptor positive breast cancer  She continues on anastrozole. She was experiencing nighttime hot flashes, and I prescribed venlafaxine at her last visit a year ago.  However she never started it because she was having some symptoms developing around the same time.  The hot flashes in any case improved.  She is on the clonidine patch for that and tolerates that well  Since her last visit, she underwent bilateral diagnostic mammography with tomography at [facility] on 07/12/2020 showing: breast density category B; no evidence of malignancy in either breast.    REVIEW OF SYSTEMS: Stori is feeling anxious.  There is no specific issue that is making her anxious.  There are some family problems but these are not unusual.  She did have a niece die about 2 weeks ago in the setting of post renal transplant.  Otherwise [name] is doing "pretty good".  She had an episode of something she thinks may have been shingles in her upper left back but this has resolved.  A detailed review of systems today was otherwise negative.   COVID 19 VACCINATION STATUS: [place] x2, status post booster x2, most recently May 2022   BREAST CANCER HISTORY:  from the original intake note:  Amanda [name] had  routine screening mammography at [facility] 03/10/2015. The breast density was category B. There was a new cluster of nodules in the right breast at the 11:00 position measuring approximately 5 mm. Ultrasonography 03/15/2014 confirmed a 1.1 center meter oval mass in the right breast at the 10:00 position 6 cm from the nipple. There was a benign 0.8 cm simple cyst immediately adjacent to the mass. The right axilla was sonographically benign.  Biopsy of the right breast mass in question 03/22/2015 showed ([id number]) invasive ductal carcinoma, grade 1, estrogen receptor 95% positive, progesterone receptor 80% positive, both with strong staining intensity, with an MIB-1 of 15%, and no HER-2 amplification, the signals ratio being 1.58 and the number per cell 3.00.  Her subsequent history is as detailed below   PAST MEDICAL HISTORY: Past Medical History:  Diagnosis Date   Arthritis    Breast cancer ([place])    Breast cancer of upper-outer quadrant of right female breast ([place]) 03/24/2015   Carpal tunnel syndrome    Diabetes mellitus without complication ([place])    type 2   GERD (gastroesophageal reflux disease)    Pt takes OTC when needed.   Hot flashes     PAST SURGICAL HISTORY: Past Surgical History:  Procedure Laterality Date   ABDOMINAL HYSTERECTOMY     APPENDECTOMY     COLONOSCOPY     ESOPHAGOGASTRODUODENOSCOPY     EYE SURGERY Bilateral    Cataracts removed   JOINT REPLACEMENT     RADIOACTIVE SEED GUIDED PARTIAL MASTECTOMY WITH AXILLARY SENTINEL LYMPH NODE BIOPSY Right 04/16/2015   Procedure: RADIOACTIVE SEED GUIDED PARTIAL MASTECTOMY WITH AXILLARY SENTINEL  LYMPH NODE BIOPSY;  Surgeon: Alphonsa Overall, MD;  Location: [facility];  Service: General;  Laterality: Right;   SHOULDER SURGERY Right    TONSILLECTOMY     TOTAL KNEE ARTHROPLASTY Right 07/18/2013   DR GRAVES   TOTAL KNEE ARTHROPLASTY Right 07/18/2013   Procedure: TOTAL KNEE ARTHROPLASTY;  Surgeon: Alta Corning, MD;   Location: [facility];  Service: Orthopedics;  Laterality: Right;   TOTAL KNEE ARTHROPLASTY Left 06/15/2017   Procedure: LEFT TOTAL KNEE ARTHROPLASTY;  Surgeon: Dorna Leitz, MD;  Location: [facility];  Service: Orthopedics;  Laterality: Left;    FAMILY HISTORY No family history on file. The patient's father died in his 26s from reasons unclear to the patient. Her mother died from heart disease at the age of 106. The patient had one brother, 5 sisters. There is no history of breast or ovarian cancer in the family to her knowledge.   GYNECOLOGIC HISTORY:  No LMP recorded. Patient has had a hysterectomy. Menarche age 75, first live birth age 75. She is GX P2. She underwent hysterectomy with bilateral salpingo-oophorectomy in her 75s. 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  She works part-time doing Chief Strategy Officer.  She is divorced and lives alone, with no pets. Son Amanda [name] lives in [place] where he manages an auto zone. Daughter Amanda [name] lives in [place]. She works for [employer]. The patient has 4 grandchildren. She is a Psychologist, forensic.    ADVANCED DIRECTIVES: Not in place   HEALTH MAINTENANCE: Social History   Tobacco Use   Smoking status: Never   Smokeless tobacco: Never  Vaping Use   Vaping Use: Never used  Substance Use Topics   Alcohol use: Yes    Comment: socially, occ weekends   Drug use: No     Colonoscopy: 2016/ [facility]  PAP: Status post hysterectomy  Bone density: 2016/ normal/ [facility]  Lipid panel:  Allergies  Allergen Reactions   Statins Other (See Comments)    Muscle pain. Pt able to take Crestor but not everyday.   Jardiance [Empagliflozin] Nausea Only   Meperidine Hcl Rash    Current Outpatient Medications  Medication Sig Dispense Refill   Blood Glucose Monitoring Suppl (ONE TOUCH ULTRA 2) w/Device KIT 1 each by Does not apply route daily. Use daily to check blood  sugar. 1 each 0   Cholecalciferol (VITAMIN D3) 2000 units TABS Take 6,000 Units by mouth daily.      cloNIDine (CATAPRES - DOSED IN MG/24 HR) 0.1 mg/24hr patch APPLY 1 PATCH EXTERNALLY TO THE SKIN EVERY WEEK 4 patch 12   dapagliflozin propanediol (FARXIGA) 5 MG TABS tablet Take 1 tablet (5 mg total) by mouth daily. 30 tablet 3   glucose blood test strip Use Onetouch Ultra test strips as instructed to check blood sugar once daily. 100 each 2   olopatadine (PATANOL) 0.1 % ophthalmic solution Instill 1 drop in left eye twice daily  11   Pitavastatin Calcium (LIVALO) 4 MG TABS TAKE 1 TABLET BY MOUTH DAILY 30 tablet 2   TRULICITY 8.93 YB/0.1BP SOPN INJECT 0.75 MG UNDER THE SKIN ONCE A WEEK 6 mL 3   No current facility-administered medications for this visit.    OBJECTIVE:  African-American woman in no acute distress Vitals:   07/22/20 0912  BP: 135/65  Pulse: 65  Resp: 18  Temp: 97.7 F (36.5 C)  SpO2: 99%  Body mass index is 33.38 kg/m.    ECOG FS:1 - Symptomatic but completely ambulatory  Sclerae unicteric, EOMs intact Wearing a mask No cervical or supraclavicular adenopathy Lungs no rales or rhonchi Heart regular rate and rhythm Abd soft, nontender, positive bowel sounds MSK no focal spinal tenderness, no upper extremity lymphedema Neuro: nonfocal, well oriented, appropriate affect Breasts: The right breast is status postlumpectomy and radiation.  There is no evidence of local recurrence.  Left breast and both axillae are benign  LAB RESULTS:  CMP     Component Value Date/Time   NA 141 07/22/2020 0844   NA 144 05/09/2016 1108   K 4.7 07/22/2020 0844   K 4.9 05/09/2016 1108   CL 105 07/22/2020 0844   CO2 30 07/22/2020 0844   CO2 27 05/09/2016 1108   GLUCOSE 144 (H) 07/22/2020 0844   GLUCOSE 124 05/09/2016 1108   BUN 21 07/22/2020 0844   BUN 21.0 05/09/2016 1108   CREATININE 1.00 07/22/2020 0844   CREATININE 1.0 05/09/2016 1108   CALCIUM 10.2 07/22/2020 0844    CALCIUM 10.4 05/09/2016 1108   PROT 6.0 (L) 07/22/2020 0844   PROT 7.1 05/09/2016 1108   ALBUMIN 3.9 07/22/2020 0844   ALBUMIN 3.6 05/09/2016 1108   AST 19 07/22/2020 0844   AST 18 05/09/2016 1108   ALT 18 07/22/2020 0844   ALT 20 05/09/2016 1108   ALKPHOS 56 07/22/2020 0844   ALKPHOS 78 05/09/2016 1108   BILITOT 0.3 07/22/2020 0844   BILITOT 0.61 05/09/2016 1108   GFRNONAA 59 (L) 07/22/2020 0844   GFRAA >60 07/17/2017 0821    INo results found for: SPEP, UPEP  Lab Results  Component Value Date   WBC 6.0 07/22/2020   NEUTROABS 2.7 07/22/2020   HGB 12.7 07/22/2020   HCT 38.9 07/22/2020   MCV 96.3 07/22/2020   PLT 342 07/22/2020      Chemistry      Component Value Date/Time   NA 141 07/22/2020 0844   NA 144 05/09/2016 1108   K 4.7 07/22/2020 0844   K 4.9 05/09/2016 1108   CL 105 07/22/2020 0844   CO2 30 07/22/2020 0844   CO2 27 05/09/2016 1108   BUN 21 07/22/2020 0844   BUN 21.0 05/09/2016 1108   CREATININE 1.00 07/22/2020 0844   CREATININE 1.0 05/09/2016 1108      Component Value Date/Time   CALCIUM 10.2 07/22/2020 0844   CALCIUM 10.4 05/09/2016 1108   ALKPHOS 56 07/22/2020 0844   ALKPHOS 78 05/09/2016 1108   AST 19 07/22/2020 0844   AST 18 05/09/2016 1108   ALT 18 07/22/2020 0844   ALT 20 05/09/2016 1108   BILITOT 0.3 07/22/2020 0844   BILITOT 0.61 05/09/2016 1108       No results found for: LABCA2  No components found for: LABCA125  No results for input(s): INR in the last 168 hours.  Urinalysis    Component Value Date/Time   COLORURINE YELLOW 07/15/2020 0932   APPEARANCEUR CLEAR 07/15/2020 0932   LABSPEC 1.020 07/15/2020 0932   PHURINE 5.5 07/15/2020 0932   GLUCOSEU NEGATIVE 07/15/2020 0932   HGBUR NEGATIVE 07/15/2020 0932   BILIRUBINUR NEGATIVE 07/15/2020 0932   KETONESUR NEGATIVE 07/15/2020 0932   PROTEINUR NEGATIVE 06/07/2017 0838   UROBILINOGEN 0.2 07/15/2020 0932   NITRITE NEGATIVE 07/15/2020 0932   LEUKOCYTESUR NEGATIVE 07/15/2020  0932    ELIGIBLE FOR AVAILABLE RESEARCH PROTOCOL: no   STUDIES: No results found.   ASSESSMENT: 75 y.o. [place] woman  status post right breast upper outer quadrant biopsy 03/22/2015 for a clinical T1CN0, stage IA invasive ductal carcinoma, grade 1, estrogen and progesterone receptor positive, HER-2 not amplified, with an MIB-1 of 15%.  (1)  Status post right lumpectomy and sentinel lymph node sampling 04/16/2015 for a pT1c pN0, stage IA  Invasive ductal carcinoma, grade 1, repeat HER-2 again negative  (2) Oncotype DX  Score of 12 predicts a risk of outside the breast recurrence of 8% within 10 years if the patient's only systemic therapy is tamoxifen for 5 years. It also predicts no benefit from chemotherapy.  (3) adjuvant radiation completed 07/19/2015   (4) started anastrozole 10/08/2015, interrupted because of a rash, restarted 11/29/2015  (a) DEXA scan 05/28/2014 was normal with a T score of +0.7  (b) completed 5 years of anti-estrogens June 2022   PLAN: Jaylynne is now a little over 5 years out from definitive surgery for her breast cancer with no evidence of disease recurrence.  This is very favorable.  She is completing 5 years of anastrozole.  She has tolerated that well.  I do not believe she will derive significant benefit from continuing an additional 2 years so she is stopping now.  She never started the venlafaxine so we do not have to taper that off.  She is benefiting from the clonidine patch and I have given her refills for 1 year after which she will have it refilled at her primary care's discretion.  I reassured her that the occasional sensitivity, soreness, discomfort, and shooting pains that she may experience in the right breast are postop and do not indicate any increased risk of breast cancer recurrence.  At this point I feel comfortable releasing her from follow-up here.  All she will need in terms of breast cancer screening is a yearly screening mammography  and a yearly physician breast exam  I will be glad to see [name] again at any point in the future if and when the need arises but as of now we are making no further routine appointments for her here  Total encounter time 25 minutes.*   Orissa Arreaga, Virgie Dad, MD  07/22/20 9:47 AM Medical Oncology and Hematology [facility] [place], [place] [address] Tel. [phone]    Fax. [phone]   I, Wilburn Mylar, am acting as scribe for Dr. Virgie Dad. Orrin Yurkovich.  I, Lurline Del MD, have reviewed the above documentation for accuracy and completeness, and I agree with the above.   *Total Encounter Time as defined by the Centers for Medicare and Medicaid Services includes, in addition to the face-to-face time of a patient visit (documented in the note above) non-face-to-face time: obtaining and reviewing outside history, ordering and reviewing medications, tests or procedures, care coordination (communications with other health care professionals or caregivers) and documentation in the medical record.

## 2020-07-22 ENCOUNTER — Encounter: Payer: Self-pay | Admitting: Oncology

## 2020-07-22 ENCOUNTER — Inpatient Hospital Stay: Payer: Medicare Other | Attending: Oncology | Admitting: Oncology

## 2020-07-22 ENCOUNTER — Inpatient Hospital Stay: Payer: Medicare Other

## 2020-07-22 ENCOUNTER — Other Ambulatory Visit: Payer: Self-pay

## 2020-07-22 VITALS — BP 135/65 | HR 65 | Temp 97.7°F | Resp 18 | Ht 62.0 in | Wt 182.5 lb

## 2020-07-22 DIAGNOSIS — Z923 Personal history of irradiation: Secondary | ICD-10-CM | POA: Diagnosis not present

## 2020-07-22 DIAGNOSIS — C50411 Malignant neoplasm of upper-outer quadrant of right female breast: Secondary | ICD-10-CM

## 2020-07-22 DIAGNOSIS — Z170 Estrogen receptor positive status [ER+]: Secondary | ICD-10-CM

## 2020-07-22 DIAGNOSIS — Z7984 Long term (current) use of oral hypoglycemic drugs: Secondary | ICD-10-CM | POA: Diagnosis not present

## 2020-07-22 DIAGNOSIS — Z79899 Other long term (current) drug therapy: Secondary | ICD-10-CM | POA: Diagnosis not present

## 2020-07-22 DIAGNOSIS — Z90722 Acquired absence of ovaries, bilateral: Secondary | ICD-10-CM | POA: Diagnosis not present

## 2020-07-22 DIAGNOSIS — K219 Gastro-esophageal reflux disease without esophagitis: Secondary | ICD-10-CM | POA: Insufficient documentation

## 2020-07-22 DIAGNOSIS — Z79811 Long term (current) use of aromatase inhibitors: Secondary | ICD-10-CM | POA: Diagnosis not present

## 2020-07-22 DIAGNOSIS — E119 Type 2 diabetes mellitus without complications: Secondary | ICD-10-CM | POA: Insufficient documentation

## 2020-07-22 LAB — CMP (CANCER CENTER ONLY)
ALT: 18 U/L (ref 0–44)
AST: 19 U/L (ref 15–41)
Albumin: 3.9 g/dL (ref 3.5–5.0)
Alkaline Phosphatase: 56 U/L (ref 38–126)
Anion gap: 6 (ref 5–15)
BUN: 21 mg/dL (ref 8–23)
CO2: 30 mmol/L (ref 22–32)
Calcium: 10.2 mg/dL (ref 8.9–10.3)
Chloride: 105 mmol/L (ref 98–111)
Creatinine: 1 mg/dL (ref 0.44–1.00)
GFR, Estimated: 59 mL/min — ABNORMAL LOW
Glucose, Bld: 144 mg/dL — ABNORMAL HIGH (ref 70–99)
Potassium: 4.7 mmol/L (ref 3.5–5.1)
Sodium: 141 mmol/L (ref 135–145)
Total Bilirubin: 0.3 mg/dL (ref 0.3–1.2)
Total Protein: 6 g/dL — ABNORMAL LOW (ref 6.5–8.1)

## 2020-07-22 LAB — CBC WITH DIFFERENTIAL/PLATELET
Abs Immature Granulocytes: 0.02 K/uL (ref 0.00–0.07)
Basophils Absolute: 0 K/uL (ref 0.0–0.1)
Basophils Relative: 1 %
Eosinophils Absolute: 0.2 K/uL (ref 0.0–0.5)
Eosinophils Relative: 3 %
HCT: 38.9 % (ref 36.0–46.0)
Hemoglobin: 12.7 g/dL (ref 12.0–15.0)
Immature Granulocytes: 0 %
Lymphocytes Relative: 44 %
Lymphs Abs: 2.7 K/uL (ref 0.7–4.0)
MCH: 31.4 pg (ref 26.0–34.0)
MCHC: 32.6 g/dL (ref 30.0–36.0)
MCV: 96.3 fL (ref 80.0–100.0)
Monocytes Absolute: 0.5 K/uL (ref 0.1–1.0)
Monocytes Relative: 8 %
Neutro Abs: 2.7 K/uL (ref 1.7–7.7)
Neutrophils Relative %: 44 %
Platelets: 342 K/uL (ref 150–400)
RBC: 4.04 MIL/uL (ref 3.87–5.11)
RDW: 15.9 % — ABNORMAL HIGH (ref 11.5–15.5)
WBC: 6 K/uL (ref 4.0–10.5)
nRBC: 0 % (ref 0.0–0.2)

## 2020-07-22 MED ORDER — CLONIDINE 0.1 MG/24HR TD PTWK: MEDICATED_PATCH | TRANSDERMAL | 12 refills | Status: DC

## 2020-07-28 DIAGNOSIS — B0229 Other postherpetic nervous system involvement: Secondary | ICD-10-CM | POA: Diagnosis not present

## 2020-08-04 DIAGNOSIS — K219 Gastro-esophageal reflux disease without esophagitis: Secondary | ICD-10-CM | POA: Diagnosis not present

## 2020-08-04 DIAGNOSIS — J45909 Unspecified asthma, uncomplicated: Secondary | ICD-10-CM | POA: Diagnosis not present

## 2020-08-04 DIAGNOSIS — E119 Type 2 diabetes mellitus without complications: Secondary | ICD-10-CM | POA: Diagnosis not present

## 2020-08-04 DIAGNOSIS — I10 Essential (primary) hypertension: Secondary | ICD-10-CM | POA: Diagnosis not present

## 2020-08-04 DIAGNOSIS — E1169 Type 2 diabetes mellitus with other specified complication: Secondary | ICD-10-CM | POA: Diagnosis not present

## 2020-08-04 DIAGNOSIS — E782 Mixed hyperlipidemia: Secondary | ICD-10-CM | POA: Diagnosis not present

## 2020-08-31 DIAGNOSIS — E119 Type 2 diabetes mellitus without complications: Secondary | ICD-10-CM | POA: Diagnosis not present

## 2020-08-31 DIAGNOSIS — J45909 Unspecified asthma, uncomplicated: Secondary | ICD-10-CM | POA: Diagnosis not present

## 2020-08-31 DIAGNOSIS — I10 Essential (primary) hypertension: Secondary | ICD-10-CM | POA: Diagnosis not present

## 2020-08-31 DIAGNOSIS — Z0000 Encounter for general adult medical examination without abnormal findings: Secondary | ICD-10-CM | POA: Diagnosis not present

## 2020-08-31 DIAGNOSIS — E782 Mixed hyperlipidemia: Secondary | ICD-10-CM | POA: Diagnosis not present

## 2020-08-31 DIAGNOSIS — Z23 Encounter for immunization: Secondary | ICD-10-CM | POA: Diagnosis not present

## 2020-08-31 DIAGNOSIS — E559 Vitamin D deficiency, unspecified: Secondary | ICD-10-CM | POA: Diagnosis not present

## 2020-08-31 DIAGNOSIS — Z1159 Encounter for screening for other viral diseases: Secondary | ICD-10-CM | POA: Diagnosis not present

## 2020-08-31 DIAGNOSIS — Z7984 Long term (current) use of oral hypoglycemic drugs: Secondary | ICD-10-CM | POA: Diagnosis not present

## 2020-08-31 DIAGNOSIS — Z1389 Encounter for screening for other disorder: Secondary | ICD-10-CM | POA: Diagnosis not present

## 2020-08-31 DIAGNOSIS — K219 Gastro-esophageal reflux disease without esophagitis: Secondary | ICD-10-CM | POA: Diagnosis not present

## 2020-09-30 DIAGNOSIS — H04203 Unspecified epiphora, bilateral lacrimal glands: Secondary | ICD-10-CM | POA: Diagnosis not present

## 2020-09-30 DIAGNOSIS — H40013 Open angle with borderline findings, low risk, bilateral: Secondary | ICD-10-CM | POA: Diagnosis not present

## 2020-09-30 DIAGNOSIS — E119 Type 2 diabetes mellitus without complications: Secondary | ICD-10-CM | POA: Diagnosis not present

## 2020-10-07 ENCOUNTER — Other Ambulatory Visit: Payer: Self-pay | Admitting: Endocrinology

## 2020-10-21 DIAGNOSIS — J45909 Unspecified asthma, uncomplicated: Secondary | ICD-10-CM | POA: Diagnosis not present

## 2020-10-21 DIAGNOSIS — I10 Essential (primary) hypertension: Secondary | ICD-10-CM | POA: Diagnosis not present

## 2020-10-21 DIAGNOSIS — E782 Mixed hyperlipidemia: Secondary | ICD-10-CM | POA: Diagnosis not present

## 2020-10-21 DIAGNOSIS — E119 Type 2 diabetes mellitus without complications: Secondary | ICD-10-CM | POA: Diagnosis not present

## 2020-10-21 DIAGNOSIS — E1169 Type 2 diabetes mellitus with other specified complication: Secondary | ICD-10-CM | POA: Diagnosis not present

## 2020-10-21 DIAGNOSIS — K219 Gastro-esophageal reflux disease without esophagitis: Secondary | ICD-10-CM | POA: Diagnosis not present

## 2020-11-02 DIAGNOSIS — Z23 Encounter for immunization: Secondary | ICD-10-CM | POA: Diagnosis not present

## 2020-11-09 ENCOUNTER — Other Ambulatory Visit (HOSPITAL_COMMUNITY): Payer: Self-pay

## 2020-11-09 ENCOUNTER — Telehealth: Payer: Self-pay | Admitting: Pharmacy Technician

## 2020-11-09 NOTE — Telephone Encounter (Signed)
Patient Advocate Encounter   Received notification from CoverMyMeds that prior authorization for Livalo 1MG  is needing to be renewed.   PA submitted on 11/09/20 Key [id number]  Prior Authorization fhas been approved.    PA# PA Case ID: [id number] Effective dates: 10/10/20 through 11/09/21  However, It appears the pt is on the 4mg  now and it was filled today, per test claim.

## 2020-11-10 ENCOUNTER — Other Ambulatory Visit (HOSPITAL_COMMUNITY): Payer: Self-pay

## 2020-11-19 ENCOUNTER — Other Ambulatory Visit: Payer: Medicare Other

## 2020-11-22 ENCOUNTER — Other Ambulatory Visit: Payer: Medicare Other

## 2020-11-24 ENCOUNTER — Other Ambulatory Visit: Payer: Self-pay

## 2020-11-24 ENCOUNTER — Ambulatory Visit: Payer: Medicare Other | Admitting: Endocrinology

## 2020-11-24 ENCOUNTER — Telehealth: Payer: Self-pay | Admitting: Endocrinology

## 2020-11-24 DIAGNOSIS — E1165 Type 2 diabetes mellitus with hyperglycemia: Secondary | ICD-10-CM

## 2020-11-24 MED ORDER — GLUCOSE BLOOD VI STRP: ORAL_STRIP | 2 refills | Status: DC

## 2020-11-24 NOTE — Telephone Encounter (Signed)
Pt calling to request refill of OneTouch test strips to  [facility]  Pt contact [phone]

## 2020-12-23 ENCOUNTER — Other Ambulatory Visit: Payer: Self-pay | Admitting: Oncology

## 2020-12-23 DIAGNOSIS — C50411 Malignant neoplasm of upper-outer quadrant of right female breast: Secondary | ICD-10-CM

## 2020-12-27 ENCOUNTER — Telehealth: Payer: Self-pay

## 2020-12-27 NOTE — Telephone Encounter (Signed)
Return call to pt regarding clonodine Rx.  I sent this refill to the pharmacy and advised pt that PCP will need to do future refills, per MD's last visit note with [place].  Pt verbalized understanding and thanks

## 2021-01-06 ENCOUNTER — Other Ambulatory Visit: Payer: Self-pay

## 2021-01-06 ENCOUNTER — Other Ambulatory Visit (INDEPENDENT_AMBULATORY_CARE_PROVIDER_SITE_OTHER): Payer: Medicare Other

## 2021-01-06 ENCOUNTER — Telehealth: Payer: Self-pay

## 2021-01-06 DIAGNOSIS — E119 Type 2 diabetes mellitus without complications: Secondary | ICD-10-CM | POA: Diagnosis not present

## 2021-01-06 DIAGNOSIS — E7800 Pure hypercholesterolemia, unspecified: Secondary | ICD-10-CM

## 2021-01-06 LAB — BASIC METABOLIC PANEL
BUN: 25 mg/dL — ABNORMAL HIGH (ref 6–23)
CO2: 30 mEq/L (ref 19–32)
Calcium: 11.3 mg/dL — ABNORMAL HIGH (ref 8.4–10.5)
Chloride: 106 mEq/L (ref 96–112)
Creatinine, Ser: 0.93 mg/dL (ref 0.40–1.20)
GFR: 60.1 mL/min (ref >60.00)
Glucose, Bld: 123 mg/dL — ABNORMAL HIGH (ref 70–99)
Potassium: 5.3 mEq/L — ABNORMAL HIGH (ref 3.5–5.1)
Sodium: 141 mEq/L (ref 135–145)

## 2021-01-06 LAB — LIPID PANEL
Cholesterol: 253 mg/dL — ABNORMAL HIGH (ref 0–200)
HDL: 73 mg/dL (ref >39.00)
LDL Cholesterol: 167 mg/dL — ABNORMAL HIGH (ref 0–99)
NonHDL: 179.75
Total CHOL/HDL Ratio: 3
Triglycerides: 64 mg/dL (ref 0.0–149.0)
VLDL: 12.8 mg/dL (ref 0.0–40.0)

## 2021-01-06 LAB — HEMOGLOBIN A1C: Hgb A1c MFr Bld: 6.5 % (ref 4.6–6.5)

## 2021-01-07 NOTE — Telephone Encounter (Signed)
Amanda [name] is aware of her elevated Potassium level. Per patient she called her PCP.  And he wanted to know if Dr. Dwyane Dee had any suggestions on what to do?    Patient would like a call back on phone [phone].

## 2021-01-11 ENCOUNTER — Other Ambulatory Visit: Payer: Self-pay

## 2021-01-11 ENCOUNTER — Ambulatory Visit (INDEPENDENT_AMBULATORY_CARE_PROVIDER_SITE_OTHER): Payer: Medicare Other | Admitting: Endocrinology

## 2021-01-11 VITALS — BP 130/80 | HR 64 | Ht 62.0 in | Wt 170.6 lb

## 2021-01-11 DIAGNOSIS — Z23 Encounter for immunization: Secondary | ICD-10-CM

## 2021-01-11 DIAGNOSIS — Z170 Estrogen receptor positive status [ER+]: Secondary | ICD-10-CM

## 2021-01-11 DIAGNOSIS — C50411 Malignant neoplasm of upper-outer quadrant of right female breast: Secondary | ICD-10-CM | POA: Diagnosis not present

## 2021-01-11 DIAGNOSIS — E7800 Pure hypercholesterolemia, unspecified: Secondary | ICD-10-CM | POA: Diagnosis not present

## 2021-01-11 DIAGNOSIS — E119 Type 2 diabetes mellitus without complications: Secondary | ICD-10-CM

## 2021-01-11 MED ORDER — CLONIDINE 0.1 MG/24HR TD PTWK: MEDICATED_PATCH | TRANSDERMAL | 5 refills | Status: DC

## 2021-01-11 MED ORDER — EZETIMIBE 10 MG PO TABS: 10.0000 mg | ORAL_TABLET | Freq: Every day | ORAL | 1 refills | Status: DC

## 2021-01-11 NOTE — Patient Instructions (Addendum)
Check blood sugars on waking up 2 days a week  Also check blood sugars about 2 hours after meals and do this after different meals by rotation  Recommended blood sugar levels on waking up are 90-130 and about 2 hours after meal is 130-160  Please bring your blood sugar monitor to each visit, thank you   Reduce:Bananas, oranges, tomatoes, spinach and other green leafy vegetables, melons, peas and beans, and potatoes yogurt.

## 2021-01-11 NOTE — Progress Notes (Signed)
Amanda [name] is a 75 y.o. female.             Reason for Appointment: Endocrinology follow-up   Initial diagnosis of diabetes:?  2007   History of Present Illness   Diagnosis: Type 2 DIABETES MELITUS     Previous history: She has had difficulty with blood sugar control initially because of limited tolerability with metformin and difficulty with weight loss. She did very well with starting Victoza which has helped her blood sugar control as well as some improvement in her obesity Her insurance company did not prefer Victoza and she was switched to Trulicity Subsequently her glipizide was stopped She was started on Invokana 100 mg daily in addition to her Trulicity 2.23 mg when her blood sugars were higher in 2017  Recent history:    Non-insulin hypoglycemic drugs:  Trulicity 3.61 mg weekly, Farxiga 5 mg daily   Current blood sugar patterns, management and problems identified:  Her A1c is slightly better at 6.5 She has lost about 12 pounds  Although she thinks this is mostly from overall eating better this is likely from stress when she had a family issue in October; also is now working instead of staying at home She is still doing about the same amount of exercise She has checked her sugars sporadically and not after her main meal in the evening However most of her sugars are fairly good and generally below 140 including fasting Has tolerated [place] well Taking Trulicity very regularly     Side effects from medications: Diarrhea from metformin.  Nausea from 1.5 mg Trulicity         Monitors blood glucose:  less than Once a day.    Glucometer: One Touch ultra 2  AVERAGE glucose recently 122 Range 108-136 with most readings between 9 AM and 4 PM   Physical activity: exercise: walking on treadmill 1-4 days per week upto 60 min            Wt Readings from Last 3 Encounters:  01/11/21 170 lb 9.6 oz (77.4 kg)  07/22/20 182 lb 8 oz (82.8 kg)  07/20/20 182 lb 6.4 oz (82.7 kg)     LABS:  Lab Results  Component Value Date   HGBA1C 6.5 01/06/2021   HGBA1C 6.8 (H) 07/15/2020   HGBA1C 6.6 (H) 04/06/2020   Lab Results  Component Value Date   MICROALBUR 1.3 07/15/2020   LDLCALC 167 (H) 01/06/2021   CREATININE 0.93 01/06/2021   Other active problems discussed today: See review of systems   Lab on 01/06/2021  Component Date Value Ref Range Status   Cholesterol 01/06/2021 253 (H)  0 - 200 mg/dL Final   ATP III Classification       Desirable:  < 200 mg/dL               Borderline High:  200 - 239 mg/dL          High:  > = 240 mg/dL   Triglycerides 01/06/2021 64.0  0.0 - 149.0 mg/dL Final   Normal:  <150 mg/dLBorderline High:  150 - 199 mg/dL   HDL 01/06/2021 73.00  >39.00 mg/dL Final   VLDL 01/06/2021 12.8  0.0 - 40.0 mg/dL Final   LDL Cholesterol 01/06/2021 167 (H)  0 - 99 mg/dL Final   Total CHOL/HDL Ratio 01/06/2021 3   Final                  Men  Women1/2 Average Risk     3.4          3.3Average Risk          5.0          4.42X Average Risk          9.6          7.13X Average Risk          15.0          11.0                       NonHDL 01/06/2021 179.75   Final   NOTE:  Non-HDL goal should be 30 mg/dL higher than patient's LDL goal (i.e. LDL goal of < 70 mg/dL, would have non-HDL goal of < 100 mg/dL)   Sodium 01/06/2021 141  135 - 145 mEq/L Final   Potassium 01/06/2021 5.3 No hemolysis seen (H)  3.5 - 5.1 mEq/L Final   Chloride 01/06/2021 106  96 - 112 mEq/L Final   CO2 01/06/2021 30  19 - 32 mEq/L Final   Glucose, Bld 01/06/2021 123 (H)  70 - 99 mg/dL Final   BUN 01/06/2021 25 (H)  6 - 23 mg/dL Final   Creatinine, Ser 01/06/2021 0.93  0.40 - 1.20 mg/dL Final   GFR 01/06/2021 60.10  >60.00 mL/min Final   Calculated using the CKD-EPI Creatinine Equation (2021)   Calcium 01/06/2021 11.3 (H)  8.4 - 10.5 mg/dL Final   Hgb A1c MFr Bld 01/06/2021 6.5  4.6 - 6.5 % Final   Glycemic Control Guidelines for People with Diabetes:Non Diabetic:  <6%Goal of  Therapy: <7%Additional Action Suggested:  >8%     Allergies as of 01/11/2021       Reactions   Statins Other (See Comments)   Muscle pain. Pt able to take Crestor but not everyday.   Jardiance [empagliflozin] Nausea Only   Meperidine Hcl Rash        Medication List        Accurate as of January 11, 2021  3:51 PM. If you have any questions, ask your nurse or doctor.          cloNIDine 0.1 mg/24hr patch Commonly known as: CATAPRES - Dosed in mg/24 hr APPLY 1 PATCH TOPICALLY TO THE SKIN EVERY WEEK   ezetimibe 10 MG tablet Commonly known as: Zetia Take 1 tablet (10 mg total) by mouth daily. Started by: [name], MD   Farxiga 5 MG Tabs tablet Generic drug: dapagliflozin propanediol TAKE 1 TABLET(5 MG) BY MOUTH DAILY   glucose blood test strip Use Onetouch Ultra test strips as instructed to check blood sugar once daily.   Livalo 4 MG Tabs Generic drug: Pitavastatin Calcium TAKE 1 TABLET BY MOUTH DAILY   olopatadine 0.1 % ophthalmic solution Commonly known as: PATANOL Instill 1 drop in left eye twice daily   ONE TOUCH ULTRA 2 w/Device Kit 1 each by Does not apply route daily. Use daily to check blood sugar.   Trulicity 9.98 PJ/8.2NK Sopn Generic drug: Dulaglutide INJECT 0.75 MG UNDER THE SKIN ONCE A WEEK   Vitamin D3 50 MCG (2000 UT) Tabs Take 6,000 Units by mouth daily.        Allergies:  Allergies  Allergen Reactions   Statins Other (See Comments)    Muscle pain. Pt able to take Crestor but not everyday.   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   Hot flashes     Past Surgical History:  Procedure Laterality Date    ABDOMINAL HYSTERECTOMY     APPENDECTOMY     COLONOSCOPY     ESOPHAGOGASTRODUODENOSCOPY     EYE SURGERY Bilateral    Cataracts removed   JOINT REPLACEMENT     RADIOACTIVE SEED GUIDED PARTIAL MASTECTOMY WITH AXILLARY SENTINEL LYMPH NODE BIOPSY Right 04/16/2015   Procedure: RADIOACTIVE SEED GUIDED PARTIAL MASTECTOMY WITH AXILLARY SENTINEL LYMPH NODE BIOPSY;  Surgeon: Alphonsa Overall, MD;  Location: [facility];  Service: General;  Laterality: Right;   SHOULDER SURGERY Right    TONSILLECTOMY     TOTAL KNEE ARTHROPLASTY Right 07/18/2013   DR GRAVES   TOTAL KNEE ARTHROPLASTY Right 07/18/2013   Procedure: TOTAL KNEE ARTHROPLASTY;  Surgeon: Alta Corning, MD;  Location: [facility];  Service: Orthopedics;  Laterality: Right;   TOTAL KNEE ARTHROPLASTY Left 06/15/2017   Procedure: LEFT TOTAL KNEE ARTHROPLASTY;  Surgeon: Dorna Leitz, MD;  Location: [facility];  Service: Orthopedics;  Laterality: Left;    No family history on file.  Social History:  reports that she has never smoked. She has never used smokeless tobacco. She reports current alcohol use. She reports that she does not use drugs.  Review of Systems:  Lipids:  She was switched from Crestor to Livalo because of her difficulty tolerating this because of joint pains . She is taking Livalo 4 mg  4/7 days She thinks when she was out of town for a month in October she did not take her medication  Recently has been taking it more regularly and tolerating well, may take Tylenol if she has any muscle aches or joint pains   Previously baseline LDL has been as high as 200 LDL had been better previously, but is increasing significantly, has not been below 100 at any time    Lab Results  Component Value Date   CHOL 253 (H) 01/06/2021   CHOL 219 (H) 07/15/2020   CHOL 222 (H) 04/06/2020   Lab Results  Component Value Date   HDL 73.00 01/06/2021   HDL 55.70 07/15/2020   HDL 70.00 04/06/2020   Lab Results  Component Value Date    LDLCALC 167 (H) 01/06/2021   LDLCALC 144 (H) 07/15/2020   LDLCALC 136 (H) 04/06/2020   Lab Results  Component Value Date   TRIG 64.0 01/06/2021   TRIG 96.0 07/15/2020   TRIG 83.0 04/06/2020   Lab Results  Component Value Date   CHOLHDL 3 01/06/2021   CHOLHDL 4 07/15/2020   CHOLHDL 3 04/06/2020   Lab Results  Component Value Date   LDLDIRECT 120.1 09/30/2013   LDLDIRECT 115.5 03/24/2013   LDLDIRECT 204.9 11/19/2012     Catapres patch prescribed by her oncologist mostly for sweating episodes and not for hypertension She is taking regularly now she is requesting for prescription since her oncologist has retired  BP Readings from Last 3 Encounters:  01/11/21 130/80  07/22/20 135/65  07/20/20 140/80     Foot exam done in 6/21, normal exam  Eye exam: Usually regular She is seen by [facility]  She takes vitamin D  6000 units daily as advised by her PCP  Bone density was normal in 2016 done by PCP,  HYPERCALCEMIA: She has had variably high normal calcium levels since 6/19 Not on any HCTZ PTH not suppressed Calcium is as follows  Lab Results  Component Value Date   CALCIUM 11.3 (H) 01/06/2021   CALCIUM 10.2 07/22/2020   CALCIUM 10.2 07/15/2020   CALCIUM 10.3 04/06/2020   CALCIUM 10.4 12/01/2019   CALCIUM 11.1 (H) 08/01/2019   CALCIUM 10.2 03/05/2019   Lab Results  Component Value Date   PTH 29 10/11/2018   CALCIUM 11.3 (H) 01/06/2021   CAION 1.09 (L) 01/10/2009      Examination:     BP 130/80 (BP Location: Left Arm, Patient Position: Sitting, Cuff Size: Normal)   Pulse 64   Ht '5\' 2"'  (1.575 m)   Wt 170 lb 9.6 oz (77.4 kg)   SpO2 98%   BMI 31.20 kg/m   Body mass index is 31.2 kg/m.     ASSESSMENT/ PLAN:  Diabetes type 2 with mild obesity  See history of present illness for discussion of current diabetes management, blood sugar patterns and problems identified  Her A1c has improved slightly at 6.5  She is on Trulicity and now [place]  5 mg daily and tolerating well  She has lost weight and blood sugars are looking excellent Weight loss may be related to stress but also overall decreased portions Discussed checking blood sugars after dinner which she has not done and to call if they are consistently high  She will try to exercise regularly   HYPERCHOLESTEROLEMIA:  Inadequately controlled because of difficulty tolerating statins  Recently has done fairly well on her diet and has lost weight She has taken her medication regularly in the last month at least but LDL is still not well controlled To get her LDL back to target below 100 will add Zetia and explained how this works as well as need to take it daily  HYPERKALEMIA: Etiology unclear as she is not taking any medications or OTC drugs or supplements that would cause this She will cut back on high potassium foods and a list was given  Flu vaccine given  Follow-up in 3 months  Patient Instructions  Check blood sugars on waking up 2 days a week  Also check blood sugars about 2 hours after meals and do this after different meals by rotation  Recommended blood sugar levels on waking up are 90-130 and about 2 hours after meal is 130-160  Please bring your blood sugar monitor to each visit, thank you   Reduce:Bananas, oranges, tomatoes, spinach and other green leafy vegetables, melons, peas and beans, and potatoes yogurt.    [name] 01/11/2021, 3:51 PM

## 2021-01-25 DIAGNOSIS — J45909 Unspecified asthma, uncomplicated: Secondary | ICD-10-CM | POA: Diagnosis not present

## 2021-01-25 DIAGNOSIS — K219 Gastro-esophageal reflux disease without esophagitis: Secondary | ICD-10-CM | POA: Diagnosis not present

## 2021-01-25 DIAGNOSIS — E119 Type 2 diabetes mellitus without complications: Secondary | ICD-10-CM | POA: Diagnosis not present

## 2021-01-25 DIAGNOSIS — E1169 Type 2 diabetes mellitus with other specified complication: Secondary | ICD-10-CM | POA: Diagnosis not present

## 2021-01-25 DIAGNOSIS — I10 Essential (primary) hypertension: Secondary | ICD-10-CM | POA: Diagnosis not present

## 2021-01-25 DIAGNOSIS — E782 Mixed hyperlipidemia: Secondary | ICD-10-CM | POA: Diagnosis not present

## 2021-02-04 ENCOUNTER — Telehealth: Payer: Self-pay | Admitting: Endocrinology

## 2021-02-04 NOTE — Telephone Encounter (Signed)
Patient dropped off [place] Patient Assistance on 02/04/21 - placed in Dr Jodelle Green box at front

## 2021-02-04 NOTE — Telephone Encounter (Signed)
Received paperwork and given to provider for signatures. Will fax paperwork once completed.

## 2021-02-12 ENCOUNTER — Other Ambulatory Visit: Payer: Self-pay | Admitting: Endocrinology

## 2021-03-02 NOTE — Telephone Encounter (Signed)
Called patient no answer. Left vm to give office a call back. Called to let patient know that patient assistance has been approved until the end of 2023 calendar year.

## 2021-03-02 NOTE — Telephone Encounter (Signed)
Patient called back and I informed her of approval for patient assistance

## 2021-03-06 ENCOUNTER — Other Ambulatory Visit: Payer: Self-pay | Admitting: Endocrinology

## 2021-04-07 ENCOUNTER — Other Ambulatory Visit: Payer: Self-pay

## 2021-04-07 ENCOUNTER — Other Ambulatory Visit (INDEPENDENT_AMBULATORY_CARE_PROVIDER_SITE_OTHER): Payer: BC Managed Care – PPO

## 2021-04-07 DIAGNOSIS — E119 Type 2 diabetes mellitus without complications: Secondary | ICD-10-CM | POA: Diagnosis not present

## 2021-04-07 DIAGNOSIS — E7800 Pure hypercholesterolemia, unspecified: Secondary | ICD-10-CM

## 2021-04-07 LAB — HEMOGLOBIN A1C: Hgb A1c MFr Bld: 7.1 % — ABNORMAL HIGH (ref 4.6–6.5)

## 2021-04-07 LAB — COMPREHENSIVE METABOLIC PANEL
ALT: 17 U/L (ref 0–35)
AST: 21 U/L (ref 0–37)
Albumin: 4.4 g/dL (ref 3.5–5.2)
Alkaline Phosphatase: 50 U/L (ref 39–117)
BUN: 17 mg/dL (ref 6–23)
CO2: 26 mEq/L (ref 19–32)
Calcium: 10.3 mg/dL (ref 8.4–10.5)
Chloride: 105 mEq/L (ref 96–112)
Creatinine, Ser: 0.83 mg/dL (ref 0.40–1.20)
GFR: 68.77 mL/min (ref >60.00)
Glucose, Bld: 116 mg/dL — ABNORMAL HIGH (ref 70–99)
Potassium: 4.4 mEq/L (ref 3.5–5.1)
Sodium: 139 mEq/L (ref 135–145)
Total Bilirubin: 0.9 mg/dL (ref 0.2–1.2)
Total Protein: 7 g/dL (ref 6.0–8.3)

## 2021-04-07 LAB — LIPID PANEL
Cholesterol: 183 mg/dL (ref 0–200)
HDL: 80.8 mg/dL (ref >39.00)
LDL Cholesterol: 88 mg/dL (ref 0–99)
NonHDL: 101.73
Total CHOL/HDL Ratio: 2
Triglycerides: 70 mg/dL (ref 0.0–149.0)
VLDL: 14 mg/dL (ref 0.0–40.0)

## 2021-04-08 ENCOUNTER — Other Ambulatory Visit: Payer: Medicare Other

## 2021-04-12 ENCOUNTER — Ambulatory Visit: Payer: Medicare Other | Admitting: Endocrinology

## 2021-04-15 ENCOUNTER — Encounter: Payer: Self-pay | Admitting: Endocrinology

## 2021-04-15 ENCOUNTER — Other Ambulatory Visit: Payer: Self-pay

## 2021-04-15 ENCOUNTER — Ambulatory Visit (INDEPENDENT_AMBULATORY_CARE_PROVIDER_SITE_OTHER): Payer: BC Managed Care – PPO | Admitting: Endocrinology

## 2021-04-15 VITALS — BP 132/68 | HR 66 | Ht 62.0 in | Wt 178.8 lb

## 2021-04-15 DIAGNOSIS — E7800 Pure hypercholesterolemia, unspecified: Secondary | ICD-10-CM | POA: Diagnosis not present

## 2021-04-15 DIAGNOSIS — E1165 Type 2 diabetes mellitus with hyperglycemia: Secondary | ICD-10-CM | POA: Diagnosis not present

## 2021-04-15 NOTE — Patient Instructions (Signed)
Walk 3/7  ? ?Check blood sugars on waking up 3 days a week ? ?Also check blood sugars about 2 hours after meals and do this after different meals by rotation ? ?Recommended blood sugar levels on waking up are 90-130 and about 2 hours after meal is 130-160 ? ?Please bring your blood sugar monitor to each visit, thank you ? ?

## 2021-04-15 NOTE — Progress Notes (Signed)
Amanda [name] is a 76 y.o. female.             Reason for Appointment: Endocrinology follow-up   Initial diagnosis of diabetes:?  2007   History of Present Illness   Diagnosis: Type 2 DIABETES MELITUS     Previous history: She has had difficulty with blood sugar control initially because of limited tolerability with metformin and difficulty with weight loss. She did very well with starting Victoza which has helped her blood sugar control as well as some improvement in her obesity Her insurance company did not prefer Victoza and she was switched to Trulicity Subsequently her glipizide was stopped She was started on Invokana 100 mg daily in addition to her Trulicity 1.61 mg when her blood sugars were higher in 2017  Recent history:    Non-insulin hypoglycemic drugs:  Trulicity 0.96 mg weekly, Farxiga 5 mg daily   Current blood sugar patterns, management and problems identified:  Her A1c is higher than usual at 7.1  She thinks that some of her high sugars are from getting a dose of prednisone and also an injection of steroid in her hand  She does not remember her blood sugars recently but she thinks they were significantly higher after her taking prednisone for respiratory infection, highest likely about 170 Also because this was she has not been able to exercise as usual However her weight has not gone up She says she has not had the time to exercise also because of overworking long hours on some days of the week She has overall recently not changed her diet much and still trying to eat healthy Recent blood sugars at home have been at the most about 132 after eating  In the morning her blood sugars may be ranging between about 109 and 045 Taking Trulicity very regularly     Side effects from medications: Diarrhea from metformin.  Nausea from 1.5 mg Trulicity         Monitors blood glucose:  less than Once a day.    Glucometer: One Touch ultra 2  Recent blood sugars as  above  AVERAGE glucose PREVIOUSLY 122 Range 108-136 with most readings between 9 AM and 4 PM  Physical activity: exercise: walking on treadmill 1-4 days per week upto 60 min            Wt Readings from Last 3 Encounters:  04/15/21 178 lb 12.8 oz (81.1 kg)  01/11/21 170 lb 9.6 oz (77.4 kg)  07/22/20 182 lb 8 oz (82.8 kg)    LABS:  Lab Results  Component Value Date   HGBA1C 7.1 (H) 04/07/2021   HGBA1C 6.5 01/06/2021   HGBA1C 6.8 (H) 07/15/2020   Lab Results  Component Value Date   MICROALBUR 1.3 07/15/2020   LDLCALC 88 04/07/2021   CREATININE 0.83 04/07/2021   Other active problems discussed today: See review of systems   No visits with results within 1 Week(s) from this visit.  Latest known visit with results is:  Lab on 04/07/2021  Component Date Value Ref Range Status   Cholesterol 04/07/2021 183  0 - 200 mg/dL Final   ATP III Classification       Desirable:  < 200 mg/dL               Borderline High:  200 - 239 mg/dL          High:  > = 240 mg/dL   Triglycerides 04/07/2021 70.0  0.0 - 149.0 mg/dL Final  Normal:  <150 mg/dLBorderline High:  150 - 199 mg/dL   HDL 04/07/2021 80.80  >39.00 mg/dL Final   VLDL 04/07/2021 14.0  0.0 - 40.0 mg/dL Final   LDL Cholesterol 04/07/2021 88  0 - 99 mg/dL Final   Total CHOL/HDL Ratio 04/07/2021 2   Final                  Men          Women1/2 Average Risk     3.4          3.3Average Risk          5.0          4.42X Average Risk          9.6          7.13X Average Risk          15.0          11.0                       NonHDL 04/07/2021 101.73   Final   NOTE:  Non-HDL goal should be 30 mg/dL higher than patient's LDL goal (i.e. LDL goal of < 70 mg/dL, would have non-HDL goal of < 100 mg/dL)   Sodium 04/07/2021 139  135 - 145 mEq/L Final   Potassium 04/07/2021 4.4  3.5 - 5.1 mEq/L Final   Chloride 04/07/2021 105  96 - 112 mEq/L Final   CO2 04/07/2021 26  19 - 32 mEq/L Final   Glucose, Bld 04/07/2021 116 (H)  70 - 99 mg/dL Final   BUN  04/07/2021 17  6 - 23 mg/dL Final   Creatinine, Ser 04/07/2021 0.83  0.40 - 1.20 mg/dL Final   Total Bilirubin 04/07/2021 0.9  0.2 - 1.2 mg/dL Final   Alkaline Phosphatase 04/07/2021 50  39 - 117 U/L Final   AST 04/07/2021 21  0 - 37 U/L Final   ALT 04/07/2021 17  0 - 35 U/L Final   Total Protein 04/07/2021 7.0  6.0 - 8.3 g/dL Final   Albumin 04/07/2021 4.4  3.5 - 5.2 g/dL Final   GFR 04/07/2021 68.77  >60.00 mL/min Final   Calculated using the CKD-EPI Creatinine Equation (2021)   Calcium 04/07/2021 10.3  8.4 - 10.5 mg/dL Final   Hgb A1c MFr Bld 04/07/2021 7.1 (H)  4.6 - 6.5 % Final   Glycemic Control Guidelines for People with Diabetes:Non Diabetic:  <6%Goal of Therapy: <7%Additional Action Suggested:  >8%     Allergies as of 04/15/2021       Reactions   Statins Other (See Comments)   Muscle pain. Pt able to take Crestor but not everyday.   Jardiance [empagliflozin] Nausea Only   Meperidine Hcl Rash        Medication List        Accurate as of April 15, 2021  3:15 PM. If you have any questions, ask your nurse or doctor.          cloNIDine 0.1 mg/24hr patch Commonly known as: CATAPRES - Dosed in mg/24 hr APPLY 1 PATCH TOPICALLY TO THE SKIN EVERY WEEK   ezetimibe 10 MG tablet Commonly known as: Zetia Take 1 tablet (10 mg total) by mouth daily.   Farxiga 5 MG Tabs tablet Generic drug: dapagliflozin propanediol TAKE 1 TABLET(5 MG) BY MOUTH DAILY   glucose blood test strip Use Onetouch Ultra test strips as instructed to check blood sugar once daily.  Livalo 4 MG Tabs Generic drug: Pitavastatin Calcium TAKE 1 TABLET BY MOUTH DAILY   olopatadine 0.1 % ophthalmic solution Commonly known as: PATANOL Instill 1 drop in left eye twice daily   ONE TOUCH ULTRA 2 w/Device Kit 1 each by Does not apply route daily. Use daily to check blood sugar.   Trulicity 1.24 PY/0.9XI Sopn Generic drug: Dulaglutide INJECT 0.75 MG UNDER THE SKIN ONCE A WEEK   Vitamin D3 50 MCG  (2000 UT) Tabs Take 6,000 Units by mouth daily.        Allergies:  Allergies  Allergen Reactions   Statins Other (See Comments)    Muscle pain. Pt able to take Crestor but not everyday.   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   Hot flashes     Past Surgical History:  Procedure Laterality Date   ABDOMINAL HYSTERECTOMY     APPENDECTOMY     COLONOSCOPY     ESOPHAGOGASTRODUODENOSCOPY     EYE SURGERY Bilateral    Cataracts removed   JOINT REPLACEMENT     RADIOACTIVE SEED GUIDED PARTIAL MASTECTOMY WITH AXILLARY SENTINEL LYMPH NODE BIOPSY Right 04/16/2015   Procedure: RADIOACTIVE SEED GUIDED PARTIAL MASTECTOMY WITH AXILLARY SENTINEL LYMPH NODE BIOPSY;  Surgeon: Alphonsa Overall, MD;  Location: [facility];  Service: General;  Laterality: Right;   SHOULDER SURGERY Right    TONSILLECTOMY     TOTAL KNEE ARTHROPLASTY Right 07/18/2013   DR GRAVES   TOTAL KNEE ARTHROPLASTY Right 07/18/2013   Procedure: TOTAL KNEE ARTHROPLASTY;  Surgeon: Alta Corning, MD;  Location: [facility];  Service: Orthopedics;  Laterality: Right;   TOTAL KNEE ARTHROPLASTY Left 06/15/2017   Procedure: LEFT TOTAL KNEE ARTHROPLASTY;  Surgeon: Dorna Leitz, MD;  [facility];  Service: Orthopedics;  Laterality: Left;    No family history on file.  Social History:  reports that she has never smoked. She has never used smokeless tobacco. She reports current alcohol use. She reports that she does not use drugs.  Review of Systems:  Lipids:  She was switched from Crestor to Livalo because of her difficulty tolerating this because of joint pains . She is taking Livalo 4 mg and  recently has been able to take this daily without excessive joint and muscle pains Also has been taking EZETIMIBE since her last visit when her LDL was 167    Previously baseline LDL has been as high as 200 LDL has improved significantly    Lab Results  Component Value Date   CHOL 183 04/07/2021   CHOL 253 (H) 01/06/2021   CHOL 219 (H) 07/15/2020   Lab Results  Component Value Date   HDL 80.80 04/07/2021   HDL 73.00 01/06/2021   HDL 55.70 07/15/2020   Lab Results  Component Value Date   LDLCALC 88 04/07/2021   LDLCALC 167 (H) 01/06/2021   LDLCALC 144 (H) 07/15/2020   Lab Results  Component Value Date   TRIG 70.0 04/07/2021   TRIG 64.0 01/06/2021   TRIG 96.0 07/15/2020   Lab Results  Component Value Date   CHOLHDL 2 04/07/2021  CHOLHDL 3 01/06/2021   CHOLHDL 4 07/15/2020   Lab Results  Component Value Date   LDLDIRECT 120.1 09/30/2013   LDLDIRECT 115.5 03/24/2013   LDLDIRECT 204.9 11/19/2012     She is using the Catapres patch prescribed by her oncologist mostly for sweating episodes and not for hypertension   BP Readings from Last 3 Encounters:  04/15/21 132/68  01/11/21 130/80  07/22/20 135/65     Foot exam done in 6/21, normal exam  Eye exam: Usually regular She is seen by [facility] center, recent report not available  She takes vitamin D 6000 units daily as advised by her PCP  Bone density was normal in 2016 done by PCP,  HYPERCALCEMIA: She has had variably high normal calcium levels since 6/19 Not on any HCTZ PTH not suppressed Calcium is as follows  Lab Results  Component Value Date   CALCIUM 10.3 04/07/2021   CALCIUM 11.3 (H) 01/06/2021   CALCIUM 10.2 07/22/2020   CALCIUM 10.2 07/15/2020   CALCIUM 10.3 04/06/2020   CALCIUM 10.4 12/01/2019   CALCIUM 11.1 (H) 08/01/2019   Lab Results  Component Value Date   PTH 29 10/11/2018   CALCIUM 10.3 04/07/2021   CAION 1.09 (L) 01/10/2009      Examination:     BP 132/68    Pulse  66    Ht _0  (1.575 m)    Wt 178 lb 12.8 oz (81.1 kg)    SpO2 95%    BMI 32.70 kg/m   Body mass index is 32.7 kg/m.     ASSESSMENT/ PLAN:  Diabetes type 2 with mild obesity  See history of present illness for discussion of current diabetes management, blood sugar patterns and problems identified  Her A1c has increased to 7.1 previously was at 6.5  She is on Trulicity and Farxiga 5 mg daily Likely her higher blood sugars are related to intercurrent illnesses and steroids However unable to verify her blood sugars from her home meter today Recently reports better readings  She will call if she has any rise in blood sugars and try to check more readings after meals To try and get back on her exercise regimen Consider increasing dose of Farxiga if needed  HYPERCHOLESTEROLEMIA:  Much better controlled with taking Livalo daily and also adding Zetia which she is tolerating also  LDL is 88 and she will continue the same regimen indefinitely  Hypertension blood blood pressure is still well controlled and no adverse effects of Catapres patch Recheck microalbumin on the next visit  Follow-up in 3 months  Patient Instructions  Walk 3/7   Check blood sugars on waking up 3 days a week  Also check blood sugars about 2 hours after meals and do this after different meals by rotation  Recommended blood sugar levels on waking up are 90-130 and about 2 hours after meal is 130-160  Please bring your blood sugar monitor to each visit, thank you    [name] 04/15/2021, 3:15 PM

## 2021-06-06 ENCOUNTER — Telehealth: Payer: Self-pay

## 2021-06-06 NOTE — Telephone Encounter (Signed)
Can we please do PA on the Livalo medication? Per patient her insurance is requesting. ?

## 2021-06-07 ENCOUNTER — Telehealth: Payer: Self-pay | Admitting: Pharmacy Technician

## 2021-06-07 ENCOUNTER — Other Ambulatory Visit (HOSPITAL_COMMUNITY): Payer: Self-pay

## 2021-06-07 DIAGNOSIS — E7800 Pure hypercholesterolemia, unspecified: Secondary | ICD-10-CM

## 2021-06-07 NOTE — Telephone Encounter (Signed)
Patient Advocate Encounter ? ?Received notification from [employer] that prior authorization for LIVALO '4MG'$  is required. ?  ?PA submitted on 5.2.23 ?Key [id number] ?Status is pending ?  ?[facility] will continue to follow ? ?Anira Senegal R Tiawanna Luchsinger, CPhT ?Patient Advocate ?[facility] ?Phone: [phone] ?Fax:  [phone] ? ?

## 2021-06-07 NOTE — Telephone Encounter (Signed)
PA has been submitted.

## 2021-06-08 ENCOUNTER — Other Ambulatory Visit (HOSPITAL_COMMUNITY): Payer: Self-pay

## 2021-06-08 NOTE — Telephone Encounter (Signed)
Patient Advocate Encounter ? ?Prior Authorization for  LIVALO '4MG'$   has been approved.   ? ?PA# [id number] ?Effective dates: 05/08/2021 through 06/07/2022 ? ?Patients co-pay is $33.90.  ? ? ? ?Lady Deutscher, CPhT-Adv ?Pharmacy Patient Advocate Specialist ?[facility] Patient Advocate Team ?Direct Number: [phone]  Fax: [phone] ? ?

## 2021-06-09 MED ORDER — LIVALO 4 MG PO TABS: 1.0000 | ORAL_TABLET | Freq: Every day | ORAL | 2 refills | Status: DC

## 2021-06-09 NOTE — Addendum Note (Signed)
Addended by: Cinda Quest on: 06/09/2021 03:11 PM ? ? Modules accepted: Orders ? ?

## 2021-06-09 NOTE — Telephone Encounter (Signed)
Receive a phone call from [facility] Physician requesting to call in alternate for Livalo because medication too expensive. I called patient and informed her that the PA for Livalo was approved and her copay is 33.90. I also tried to return Katie's call but was never able to get to speak with her ?

## 2021-07-15 ENCOUNTER — Other Ambulatory Visit (INDEPENDENT_AMBULATORY_CARE_PROVIDER_SITE_OTHER): Payer: Medicare Other

## 2021-07-15 DIAGNOSIS — E1165 Type 2 diabetes mellitus with hyperglycemia: Secondary | ICD-10-CM

## 2021-07-15 LAB — MICROALBUMIN / CREATININE URINE RATIO
Creatinine,U: 61.8 mg/dL
Microalb Creat Ratio: 1.1 mg/g (ref 0.0–30.0)
Microalb, Ur: <0.7 mg/dL (ref 0.0–1.9)

## 2021-07-15 LAB — COMPREHENSIVE METABOLIC PANEL
ALT: 23 U/L (ref 0–35)
AST: 22 U/L (ref 0–37)
Albumin: 4.4 g/dL (ref 3.5–5.2)
Alkaline Phosphatase: 59 U/L (ref 39–117)
BUN: 24 mg/dL — ABNORMAL HIGH (ref 6–23)
CO2: 31 mEq/L (ref 19–32)
Calcium: 10.5 mg/dL (ref 8.4–10.5)
Chloride: 104 mEq/L (ref 96–112)
Creatinine, Ser: 0.87 mg/dL (ref 0.40–1.20)
GFR: 64.87 mL/min (ref >60.00)
Glucose, Bld: 109 mg/dL — ABNORMAL HIGH (ref 70–99)
Potassium: 4.2 mEq/L (ref 3.5–5.1)
Sodium: 140 mEq/L (ref 135–145)
Total Bilirubin: 0.6 mg/dL (ref 0.2–1.2)
Total Protein: 7.4 g/dL (ref 6.0–8.3)

## 2021-07-22 ENCOUNTER — Encounter: Payer: Self-pay | Admitting: Endocrinology

## 2021-07-22 ENCOUNTER — Ambulatory Visit (INDEPENDENT_AMBULATORY_CARE_PROVIDER_SITE_OTHER): Payer: Medicare Other | Admitting: Endocrinology

## 2021-07-22 VITALS — BP 144/72 | HR 64 | Ht 62.5 in | Wt 177.4 lb

## 2021-07-22 DIAGNOSIS — E1165 Type 2 diabetes mellitus with hyperglycemia: Secondary | ICD-10-CM

## 2021-07-22 DIAGNOSIS — E213 Hyperparathyroidism, unspecified: Secondary | ICD-10-CM | POA: Diagnosis not present

## 2021-07-22 DIAGNOSIS — E7800 Pure hypercholesterolemia, unspecified: Secondary | ICD-10-CM

## 2021-07-22 LAB — POCT GLYCOSYLATED HEMOGLOBIN (HGB A1C): Hemoglobin A1C: 6.7 % — AB (ref 4.0–5.6)

## 2021-07-22 NOTE — Progress Notes (Signed)
Amanda [name] is a 76 y.o. female.             Reason for Appointment: Endocrinology follow-up   Initial diagnosis of diabetes:?  2007   History of Present Illness   Diagnosis: Type 2 DIABETES MELITUS     Previous history: She has had difficulty with blood sugar control initially because of limited tolerability with metformin and difficulty with weight loss. She did very well with starting Victoza which has helped her blood sugar control as well as some improvement in her obesity Her insurance company did not prefer Victoza and she was switched to Trulicity Subsequently her glipizide was stopped She was started on Invokana 100 mg daily in addition to her Trulicity 6.01 mg when her blood sugars were higher in 2017  Recent history:    Non-insulin hypoglycemic drugs:  Trulicity 0.93 mg weekly, Farxiga 5 mg daily   Current blood sugar patterns, management and problems identified:  Her A1c is improved at 6.5, previously was higher than usual at 7.1  Previously higher sugars were from getting a dose of prednisone and also an injection of steroid in her hand  She has not had any further steroids  Although her blood sugars are overall better she has not lost the weight she has gained Also she says she does not get the time to exercise  Because of working out of town and working 12 hours she does not plan her meals and she thinks she can do much better on her diet also  Also she is forgetting to check readings after meals  Highest sugar in the morning is only 138 and was 109 in the lab Taking Trulicity regularly without side effects     Side effects from medications: Diarrhea from metformin.  Nausea from 1.5 mg Trulicity         Monitors blood glucose:  less than Once a day.    Glucometer: One Touch ultra 2  Recent blood sugars MORNING range 106-138 with AVERAGE 127  AVERAGE glucose PREVIOUSLY 122 Range 108-136 with most readings between 9 AM and 4 PM  Physical activity:  exercise: walking on treadmill 1-4 days per week upto 60 min            Wt Readings from Last 3 Encounters:  07/22/21 177 lb 6.4 oz (80.5 kg)  04/15/21 178 lb 12.8 oz (81.1 kg)  01/11/21 170 lb 9.6 oz (77.4 kg)    LABS:  Lab Results  Component Value Date   HGBA1C 6.7 (A) 07/22/2021   HGBA1C 7.1 (H) 04/07/2021   HGBA1C 6.5 01/06/2021   Lab Results  Component Value Date   MICROALBUR <0.7 07/15/2021   LDLCALC 88 04/07/2021   CREATININE 0.87 07/15/2021   Other active problems discussed today: See review of systems   Office Visit on 07/22/2021  Component Date Value Ref Range Status   Hemoglobin A1C 07/22/2021 6.7 (A)  4.0 - 5.6 % Final    Allergies as of 07/22/2021       Reactions   Statins Other (See Comments)   Muscle pain. Pt able to take Crestor but not everyday.   Jardiance [empagliflozin] Nausea Only   Meperidine Hcl Rash        Medication List        Accurate as of July 22, 2021  3:23 PM. If you have any questions, ask your nurse or doctor.          cloNIDine 0.1 mg/24hr patch Commonly known as: CATAPRES -  Dosed in mg/24 hr APPLY 1 PATCH TOPICALLY TO THE SKIN EVERY WEEK   ezetimibe 10 MG tablet Commonly known as: Zetia Take 1 tablet (10 mg total) by mouth daily.   Farxiga 5 MG Tabs tablet Generic drug: dapagliflozin propanediol TAKE 1 TABLET(5 MG) BY MOUTH DAILY   glucose blood test strip Use Onetouch Ultra test strips as instructed to check blood sugar once daily.   Livalo 4 MG Tabs Generic drug: Pitavastatin Calcium Take 1 tablet (4 mg total) by mouth daily.   olopatadine 0.1 % ophthalmic solution Commonly known as: PATANOL Instill 1 drop in left eye twice daily   ONE TOUCH ULTRA 2 w/Device Kit 1 each by Does not apply route daily. Use daily to check blood sugar.   Trulicity 0.96 EA/5.4UJ Sopn Generic drug: Dulaglutide INJECT 0.75 MG UNDER THE SKIN ONCE A WEEK   Vitamin D3 50 MCG (2000 UT) Tabs Take 6,000 Units by mouth daily.         Allergies:  Allergies  Allergen Reactions   Statins Other (See Comments)    Muscle pain. Pt able to take Crestor but not everyday.   Jardiance [Empagliflozin] Nausea Only   Meperidine Hcl Rash    Past Medical History:  Diagnosis Date   Arthritis    Breast cancer ([place])    Breast cancer of upper-outer quadrant of right female breast ([place]) 03/24/2015   Carpal tunnel syndrome    Diabetes mellitus without complication ([place])    type 2   GERD (gastroesophageal reflux disease)    Pt takes OTC when needed.   Hot flashes     Past Surgical History:  Procedure Laterality Date   ABDOMINAL HYSTERECTOMY     APPENDECTOMY     COLONOSCOPY     ESOPHAGOGASTRODUODENOSCOPY     EYE SURGERY Bilateral    Cataracts removed   JOINT REPLACEMENT     RADIOACTIVE SEED GUIDED PARTIAL MASTECTOMY WITH AXILLARY SENTINEL LYMPH NODE BIOPSY Right 04/16/2015   Procedure: RADIOACTIVE SEED GUIDED PARTIAL MASTECTOMY WITH AXILLARY SENTINEL LYMPH NODE BIOPSY;  Surgeon: Alphonsa Overall, MD;  Location: [place];  Service: General;  Laterality: Right;   SHOULDER SURGERY Right    TONSILLECTOMY     TOTAL KNEE ARTHROPLASTY Right 07/18/2013   DR GRAVES   TOTAL KNEE ARTHROPLASTY Right 07/18/2013   Procedure: TOTAL KNEE ARTHROPLASTY;  Surgeon: Alta Corning, MD;  Location: [place];  Service: Orthopedics;  Laterality: Right;   TOTAL KNEE ARTHROPLASTY Left 06/15/2017   Procedure: LEFT TOTAL KNEE ARTHROPLASTY;  Surgeon: Dorna Leitz, MD;  Location: [facility];  Service: Orthopedics;  Laterality: Left;    No family history on file.  Social History:  reports that she has never smoked. She has never used smokeless tobacco. She reports current alcohol use. She reports that she does not use drugs.  Review of Systems:  Lipids:  She was switched from Crestor to Livalo because of her difficulty tolerating this because of joint pains . She is taking Livalo 4 mg and again has been able to take this daily without  excessive joint and muscle pains Also has been taking EZETIMIBE since previously her LDL was 167   Previously baseline LDL has been as high as 200 LDL has improved significantly, she has been able to get authorization from insurance for Livalo now    Lab Results  Component Value Date   CHOL 183 04/07/2021   CHOL 253 (H) 01/06/2021   CHOL 219 (H) 07/15/2020   Lab Results  Component Value Date   HDL 80.80 04/07/2021   HDL 73.00 01/06/2021   HDL 55.70 07/15/2020   Lab Results  Component Value Date   LDLCALC 88 04/07/2021   LDLCALC 167 (H) 01/06/2021   LDLCALC 144 (H) 07/15/2020   Lab Results  Component Value Date   TRIG 70.0 04/07/2021   TRIG 64.0 01/06/2021   TRIG 96.0 07/15/2020   Lab Results  Component Value Date   CHOLHDL 2 04/07/2021   CHOLHDL 3 01/06/2021   CHOLHDL 4 07/15/2020   Lab Results  Component Value Date   LDLDIRECT 120.1 09/30/2013   LDLDIRECT 115.5 03/24/2013   LDLDIRECT 204.9 11/19/2012     She is using the Catapres patch prescribed by her oncologist mostly for sweating episodes and not for hypertension   BP Readings from Last 3 Encounters:  07/22/21 (!) 144/72  04/15/21 132/68  01/11/21 130/80     Foot exam done in 6/21, normal exam  Eye exam: Usually regular She is seen by [facility]  She takes vitamin D 6000 units daily as advised by her PCP  Bone density was normal in 2016 done by PCP,  HYPERCALCEMIA: She has had variably high normal calcium levels since 6/19 Not on any HCTZ PTH not suppressed Calcium is as follows  Lab Results  Component Value Date   CALCIUM 10.5 07/15/2021   CALCIUM 10.3 04/07/2021   CALCIUM 11.3 (H) 01/06/2021   CALCIUM 10.2 07/22/2020   CALCIUM 10.2 07/15/2020   CALCIUM 10.3 04/06/2020   CALCIUM 10.4 12/01/2019   Lab Results  Component Value Date   PTH 29 10/11/2018   CALCIUM 10.5 07/15/2021   CAION 1.09 (L) 01/10/2009      Examination:     BP (!) 144/72   Pulse 64   Ht 5' 2.5"  (1.588 m)   Wt 177 lb 6.4 oz (80.5 kg)   SpO2 98%   BMI 31.93 kg/m   Body mass index is 31.93 kg/m.     ASSESSMENT/ PLAN:  Diabetes type 2 with mild obesity  See history of present illness for discussion of current diabetes management, blood sugar patterns and problems identified  Her A1c has come back to 6.7  She is on Trulicity and Farxiga 5 mg daily Although this is not her best A1c she thinks she can do better with diet and exercise and needs some weight loss Also needs to monitor readings after meals Discussed implementing these changes Currently not having any side effects from [place] or Trulicity  Consider increasing dose of Farxiga if needed  HYPERCHOLESTEROLEMIA:  Much better controlled with taking Livalo consistently and also adding Zetia  She will continue the same regimen  Overall blood pressure is still well controlled and no adverse effects of Catapres patch Urine microalbumin normal  Follow-up in 4 months  Patient Instructions  Check blood sugars on waking up 2-3 days a week  Also check blood sugars about 2 hours after meals and do this after different meals by rotation  Recommended blood sugar levels on waking up are 90-130 and about 2 hours after meal is 130-160  Please bring your blood sugar monitor to each visit, thank you  More exercise  [place] 07/22/2021, 3:23 PM

## 2021-07-22 NOTE — Patient Instructions (Addendum)
Check blood sugars on waking up 2-3 days a week  Also check blood sugars about 2 hours after meals and do this after different meals by rotation  Recommended blood sugar levels on waking up are 90-130 and about 2 hours after meal is 130-160  Please bring your blood sugar monitor to each visit, thank you  More exercise  [place]

## 2021-07-28 ENCOUNTER — Other Ambulatory Visit: Payer: Self-pay | Admitting: Endocrinology

## 2021-07-28 ENCOUNTER — Other Ambulatory Visit: Payer: Self-pay

## 2021-08-04 ENCOUNTER — Ambulatory Visit (INDEPENDENT_AMBULATORY_CARE_PROVIDER_SITE_OTHER): Payer: Medicare Other | Admitting: Podiatry

## 2021-08-04 ENCOUNTER — Ambulatory Visit: Payer: Medicare Other

## 2021-08-04 DIAGNOSIS — M778 Other enthesopathies, not elsewhere classified: Secondary | ICD-10-CM

## 2021-08-04 DIAGNOSIS — L600 Ingrowing nail: Secondary | ICD-10-CM

## 2021-08-04 MED ORDER — NEOMYCIN-POLYMYXIN-HC 1 % OT SOLN
OTIC | 1 refills | Status: DC
Start: 2021-08-04 — End: 2021-12-15

## 2021-08-04 NOTE — Progress Notes (Signed)
Subjective:  Patient ID: Amanda [name], female    DOB: 1945/11/30,  MRN: [id number] HPI Chief Complaint  Patient presents with   Toe Pain    diabetic with right great toe pain    76 y.o. female presents with the above complaint.   ROS: Denies fever chills nausea vomiting muscle aches pains calf pain back pain chest pain shortness of breath.  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   Hot flashes    Past Surgical History:  Procedure Laterality Date   ABDOMINAL HYSTERECTOMY     APPENDECTOMY     COLONOSCOPY     ESOPHAGOGASTRODUODENOSCOPY     EYE SURGERY Bilateral    Cataracts removed   JOINT REPLACEMENT     RADIOACTIVE SEED GUIDED PARTIAL MASTECTOMY WITH AXILLARY SENTINEL LYMPH NODE BIOPSY Right 04/16/2015   Procedure: RADIOACTIVE SEED GUIDED PARTIAL MASTECTOMY WITH AXILLARY SENTINEL LYMPH NODE BIOPSY;  Surgeon: Alphonsa Overall, MD;  Location: [place];  Service: General;  Laterality: Right;   SHOULDER SURGERY Right    TONSILLECTOMY     TOTAL KNEE ARTHROPLASTY Right 07/18/2013   DR GRAVES   TOTAL KNEE ARTHROPLASTY Right 07/18/2013   Procedure: TOTAL KNEE ARTHROPLASTY;  Surgeon: Alta Corning, MD;  Location: [place];  Service: Orthopedics;  Laterality: Right;   TOTAL KNEE ARTHROPLASTY Left 06/15/2017   Procedure: LEFT TOTAL KNEE ARTHROPLASTY;  Surgeon: Dorna Leitz, MD;  Location: [facility];  Service: Orthopedics;  Laterality: Left;    Current Outpatient Medications:    NEOMYCIN-POLYMYXIN-HYDROCORTISONE (CORTISPORIN) 1 % SOLN OTIC solution, Apply 1-2 drops to toe BID after soaking, Disp: 10 mL, Rfl: 1   Blood Glucose Monitoring Suppl (ONE TOUCH ULTRA 2) w/Device KIT, 1 each by Does not apply route daily.  Use daily to check blood sugar., Disp: 1 each, Rfl: 0   Cholecalciferol (VITAMIN D3) 2000 units TABS, Take 6,000 Units by mouth daily. , Disp: , Rfl:    cloNIDine (CATAPRES - DOSED IN MG/24 HR) 0.1 mg/24hr patch, APPLY 1 PATCH TOPICALLY TO THE SKIN EVERY WEEK, Disp: 4 patch, Rfl: 5   ezetimibe (ZETIA) 10 MG tablet, TAKE 1 TABLET(10 MG) BY MOUTH DAILY, Disp: 90 tablet, Rfl: 1   FARXIGA 5 MG TABS tablet, TAKE 1 TABLET(5 MG) BY MOUTH DAILY, Disp: 30 tablet, Rfl: 3   glucose blood test strip, Use Onetouch Ultra test strips as instructed to check blood sugar once daily., Disp: 100 each, Rfl: 2   olopatadine (PATANOL) 0.1 % ophthalmic solution, Instill 1 drop in left eye twice daily (Patient not taking: Reported on 04/15/2021), Disp: , Rfl: 11   Pitavastatin Calcium (LIVALO) 4 MG TABS, Take 1 tablet (4 mg total) by mouth daily., Disp: 30 tablet, Rfl: 2   TRULICITY 2.72 ZD/6.6YQ SOPN, INJECT 0.75 MG UNDER THE SKIN ONCE A WEEK, Disp: 6 mL, Rfl: 3  Allergies  Allergen Reactions   Statins Other (See Comments)    Muscle pain. Pt able to take Crestor but not everyday.   Jardiance [Empagliflozin] Nausea Only   Meperidine Hcl Rash   Review of Systems Objective:  There were no vitals filed for this visit.  General: Well developed, nourished, in no acute  distress, alert and oriented x3   Dermatological: Skin is warm, dry and supple bilateral. Nails x 10 are well maintained; remaining integument appears unremarkable at this time. There are no open sores, no preulcerative lesions, no rash or signs of infection present.  Sharp incurvated nail margin tibial border hallux right no erythema cellulitis drainage or odor just pain on palpation particularly distal aspect of the border.  Vascular: Dorsalis Pedis artery and Posterior Tibial artery pedal pulses are 2/4 bilateral with immedate capillary fill time. Pedal hair growth present. No varicosities and no lower extremity edema present bilateral.   Neruologic:  Grossly intact via light touch bilateral. Vibratory intact via tuning fork bilateral. Protective threshold with Semmes Wienstein monofilament intact to all pedal sites bilateral. Patellar and Achilles deep tendon reflexes 2+ bilateral. No Babinski or clonus noted bilateral.   Musculoskeletal: No gross boney pedal deformities bilateral. No pain, crepitus, or limitation noted with foot and ankle range of motion bilateral. Muscular strength 5/5 in all groups tested bilateral.  Gait: Unassisted, Nonantalgic.    Radiographs:  None taken  Assessment & Plan:   Assessment: Ingrown toenail tibial border hallux right.  Plan: Discussed etiology pathology conservative versus surgical therapies.  At this point I discussed chemical matricectomy with her.  She agreed to this.  This was performed after local anesthetic was administered.  She tolerated procedure well without complications.  She was given both oral and home-going structure for the care and symptom of the toe as well as a prescription for Cortisporin Otic to be applied twice daily after soaking.  Like to follow-up with her in 2 to 3 weeks to make sure she is healing well.     Tally Mattox T. [place], [place]

## 2021-08-04 NOTE — Patient Instructions (Signed)

## 2021-08-18 ENCOUNTER — Ambulatory Visit (INDEPENDENT_AMBULATORY_CARE_PROVIDER_SITE_OTHER): Payer: Medicare Other | Admitting: Podiatry

## 2021-08-18 ENCOUNTER — Encounter: Payer: Self-pay | Admitting: Podiatry

## 2021-08-18 DIAGNOSIS — L03031 Cellulitis of right toe: Secondary | ICD-10-CM

## 2021-08-18 NOTE — Progress Notes (Signed)
Subjective:   Patient ID: Amanda [name], female   DOB: 76 y.o.   MRN: [id number]   HPI Patient presents stating concerned about the discoloration of the right hallux nail and whether there could be any issue with it from a healing perspective after having surgery 2 weeks ago   ROS      Objective:  Physical Exam  Neurovascular status intact muscle strength adequate range of motion adequate patient who is a diabetic who has type 2 diabetes well controlled has a small area of discoloration of the medial side of the right hallux local no proximal edema erythema drainage noted     Assessment:  Low-grade paronychia right hallux that is localized with no proximal issues and sugar that is under good control     Plan:  Reviewed condition and recommended soaks to be continued along with topical medicine and I do think this will be uneventful.  Patient wants to follow this route we may consider oral antibiotics or other treatment if any further redness drainage or pain were to occur

## 2021-09-16 ENCOUNTER — Other Ambulatory Visit: Payer: Self-pay

## 2021-09-16 MED ORDER — CLONIDINE 0.1 MG/24HR TD PTWK: MEDICATED_PATCH | TRANSDERMAL | 5 refills | Status: DC

## 2021-09-26 ENCOUNTER — Other Ambulatory Visit: Payer: Self-pay | Admitting: Endocrinology

## 2021-09-26 DIAGNOSIS — E7800 Pure hypercholesterolemia, unspecified: Secondary | ICD-10-CM

## 2021-10-27 ENCOUNTER — Ambulatory Visit: Payer: Medicare Other

## 2021-10-27 ENCOUNTER — Encounter: Payer: Self-pay | Admitting: Podiatry

## 2021-10-27 ENCOUNTER — Ambulatory Visit (INDEPENDENT_AMBULATORY_CARE_PROVIDER_SITE_OTHER): Payer: Medicare Other | Admitting: Podiatry

## 2021-10-27 DIAGNOSIS — L600 Ingrowing nail: Secondary | ICD-10-CM | POA: Diagnosis not present

## 2021-10-27 DIAGNOSIS — L03031 Cellulitis of right toe: Secondary | ICD-10-CM | POA: Diagnosis not present

## 2021-10-27 NOTE — Progress Notes (Signed)
Subjective:   Patient ID: Amanda [name], female   DOB: 76 y.o.   MRN: [id number]   HPI Patient presents concerned about toe pain of the right big toenail after having had removal around 3 months ago by Dr. Milinda Pointer   ROS      Objective:  Physical Exam  Neuro vascular status intact within crusted like surface on the right hallux medial side localized no erythema edema or drainage associated with it with mild discomfort     Assessment:  This may be some scab tissue in the corner that needs to come out or its possible small amount of additional nail may need to be removed at 1 point future     Plan:  Reviewed with condition and we will start cushioning of the toe and see if it just does not get better on its own.  Patient encouraged to call with any questions concerns which may arise

## 2021-10-27 NOTE — Patient Instructions (Signed)

## 2021-11-18 ENCOUNTER — Other Ambulatory Visit (INDEPENDENT_AMBULATORY_CARE_PROVIDER_SITE_OTHER): Payer: Medicare Other

## 2021-11-18 DIAGNOSIS — E1165 Type 2 diabetes mellitus with hyperglycemia: Secondary | ICD-10-CM | POA: Diagnosis not present

## 2021-11-18 DIAGNOSIS — E7800 Pure hypercholesterolemia, unspecified: Secondary | ICD-10-CM | POA: Diagnosis not present

## 2021-11-18 LAB — BASIC METABOLIC PANEL
BUN: 24 mg/dL — ABNORMAL HIGH (ref 6–23)
CO2: 29 mEq/L (ref 19–32)
Calcium: 10.2 mg/dL (ref 8.4–10.5)
Chloride: 104 mEq/L (ref 96–112)
Creatinine, Ser: 0.81 mg/dL (ref 0.40–1.20)
GFR: 70.51 mL/min (ref >60.00)
Glucose, Bld: 115 mg/dL — ABNORMAL HIGH (ref 70–99)
Potassium: 4.3 mEq/L (ref 3.5–5.1)
Sodium: 139 mEq/L (ref 135–145)

## 2021-11-18 LAB — LIPID PANEL
Cholesterol: 149 mg/dL (ref 0–200)
HDL: 63.9 mg/dL (ref >39.00)
LDL Cholesterol: 72 mg/dL (ref 0–99)
NonHDL: 84.87
Total CHOL/HDL Ratio: 2
Triglycerides: 63 mg/dL (ref 0.0–149.0)
VLDL: 12.6 mg/dL (ref 0.0–40.0)

## 2021-11-18 LAB — HEMOGLOBIN A1C: Hgb A1c MFr Bld: 7.1 % — ABNORMAL HIGH (ref 4.6–6.5)

## 2021-11-25 ENCOUNTER — Encounter: Payer: Self-pay | Admitting: Endocrinology

## 2021-11-25 ENCOUNTER — Ambulatory Visit (INDEPENDENT_AMBULATORY_CARE_PROVIDER_SITE_OTHER): Payer: Medicare Other | Admitting: Endocrinology

## 2021-11-25 VITALS — BP 124/64 | HR 67 | Ht 62.5 in | Wt 178.8 lb

## 2021-11-25 DIAGNOSIS — E1165 Type 2 diabetes mellitus with hyperglycemia: Secondary | ICD-10-CM

## 2021-11-25 DIAGNOSIS — Z794 Long term (current) use of insulin: Secondary | ICD-10-CM

## 2021-11-25 DIAGNOSIS — E782 Mixed hyperlipidemia: Secondary | ICD-10-CM | POA: Diagnosis not present

## 2021-11-25 MED ORDER — DAPAGLIFLOZIN PROPANEDIOL 10 MG PO TABS: 10.0000 mg | ORAL_TABLET | Freq: Every day | ORAL | 5 refills | Status: DC

## 2021-11-25 NOTE — Progress Notes (Unsigned)
Amanda [name] is a 76 y.o. female.             Reason for Appointment: Endocrinology follow-up   Initial diagnosis of diabetes:?  2007   History of Present Illness   Diagnosis: Type 2 DIABETES MELITUS     Previous history: She has had difficulty with blood sugar control initially because of limited tolerability with metformin and difficulty with weight loss. She did very well with starting Victoza which has helped her blood sugar control as well as some improvement in her obesity Her insurance company did not prefer Victoza and she was switched to Trulicity Subsequently her glipizide was stopped She was started on Invokana 100 mg daily in addition to her Trulicity 3.83 mg when her blood sugars were higher in 2017  Recent history:    Non-insulin hypoglycemic drugs:  Trulicity 3.38 mg weekly, Farxiga 5 mg daily   Current blood sugar patterns, management and problems identified:  Her A1c is improved at 6.5, previously was higher than usual at 7.1  Previously higher sugars were from getting a dose of prednisone and also an injection of steroid in her hand  She has not had any further steroids  Although her blood sugars are overall better she has not lost the weight she has gained Also she says she does not get the time to exercise  Because of working out of town and working 12 hours she does not plan her meals and she thinks she can do much better on her diet also  Also she is forgetting to check readings after meals  Highest sugar in the morning is only 138 and was 109 in the lab Taking Trulicity regularly without side effects     Side effects from medications: Diarrhea from metformin.  Nausea from 1.5 mg Trulicity         Monitors blood glucose:  less than Once a day.    Glucometer: One Touch ultra 2  Recent blood sugars MORNING range 106-138 with AVERAGE 127  AVERAGE glucose PREVIOUSLY 122 Range 108-136 with most readings between 9 AM and 4 PM  Physical activity:  exercise: walking on treadmill 1-4 days per week upto 60 min            Wt Readings from Last 3 Encounters:  11/25/21 178 lb 12.8 oz (81.1 kg)  07/22/21 177 lb 6.4 oz (80.5 kg)  04/15/21 178 lb 12.8 oz (81.1 kg)    LABS:  Lab Results  Component Value Date   HGBA1C 7.1 (H) 11/18/2021   HGBA1C 6.7 (A) 07/22/2021   HGBA1C 7.1 (H) 04/07/2021   Lab Results  Component Value Date   MICROALBUR <0.7 07/15/2021   LDLCALC 72 11/18/2021   CREATININE 0.81 11/18/2021   Other active problems discussed today: See review of systems   No visits with results within 1 Week(s) from this visit.  Latest known visit with results is:  Lab on 11/18/2021  Component Date Value Ref Range Status   Hgb A1c MFr Bld 11/18/2021 7.1 (H)  4.6 - 6.5 % Final   Glycemic Control Guidelines for People with Diabetes:Non Diabetic:  <6%Goal of Therapy: <7%Additional Action Suggested:  >8%    Cholesterol 11/18/2021 149  0 - 200 mg/dL Final   ATP III Classification       Desirable:  < 200 mg/dL               Borderline High:  200 - 239 mg/dL  High:  > = 240 mg/dL   Triglycerides 11/18/2021 63.0  0.0 - 149.0 mg/dL Final   Normal:  <150 mg/dLBorderline High:  150 - 199 mg/dL   HDL 11/18/2021 63.90  >39.00 mg/dL Final   VLDL 11/18/2021 12.6  0.0 - 40.0 mg/dL Final   LDL Cholesterol 11/18/2021 72  0 - 99 mg/dL Final   Total CHOL/HDL Ratio 11/18/2021 2   Final                  Men          Women1/2 Average Risk     3.4          3.3Average Risk          5.0          4.42X Average Risk          9.6          7.13X Average Risk          15.0          11.0                       NonHDL 11/18/2021 84.87   Final   NOTE:  Non-HDL goal should be 30 mg/dL higher than patient's LDL goal (i.e. LDL goal of < 70 mg/dL, would have non-HDL goal of < 100 mg/dL)   Sodium 11/18/2021 139  135 - 145 mEq/L Final   Potassium 11/18/2021 4.3  3.5 - 5.1 mEq/L Final   Chloride 11/18/2021 104  96 - 112 mEq/L Final   CO2 11/18/2021 29  19 - 32  mEq/L Final   Glucose, Bld 11/18/2021 115 (H)  70 - 99 mg/dL Final   BUN 11/18/2021 24 (H)  6 - 23 mg/dL Final   Creatinine, Ser 11/18/2021 0.81  0.40 - 1.20 mg/dL Final   GFR 11/18/2021 70.51  >60.00 mL/min Final   Calculated using the CKD-EPI Creatinine Equation (2021)   Calcium 11/18/2021 10.2  8.4 - 10.5 mg/dL Final    Allergies as of 11/25/2021       Reactions   Statins Other (See Comments)   Muscle pain. Pt able to take Crestor but not everyday.   Jardiance [empagliflozin] Nausea Only   Meperidine Hcl Rash        Medication List        Accurate as of November 25, 2021  9:39 AM. If you have any questions, ask your nurse or doctor.          cloNIDine 0.1 mg/24hr patch Commonly known as: CATAPRES - Dosed in mg/24 hr APPLY 1 PATCH TOPICALLY TO THE SKIN EVERY WEEK   ezetimibe 10 MG tablet Commonly known as: ZETIA TAKE 1 TABLET(10 MG) BY MOUTH DAILY   Farxiga 5 MG Tabs tablet Generic drug: dapagliflozin propanediol TAKE 1 TABLET(5 MG) BY MOUTH DAILY   glucose blood test strip Use Onetouch Ultra test strips as instructed to check blood sugar once daily.   Livalo 4 MG Tabs Generic drug: Pitavastatin Calcium TAKE 1 TABLET(4 MG) BY MOUTH DAILY   NEOMYCIN-POLYMYXIN-HYDROCORTISONE 1 % Soln OTIC solution Commonly known as: CORTISPORIN Apply 1-2 drops to toe BID after soaking   olopatadine 0.1 % ophthalmic solution Commonly known as: PATANOL Instill 1 drop in left eye twice daily   ONE TOUCH ULTRA 2 w/Device Kit 1 each by Does not apply route daily. Use daily to check blood sugar.   Trulicity 3.50 KX/3.8HW Sopn Generic drug: Dulaglutide  INJECT 0.75 MG UNDER THE SKIN ONCE A WEEK   Vitamin D3 50 MCG (2000 UT) Tabs Take 6,000 Units by mouth daily.        Allergies:  Allergies  Allergen Reactions   Statins Other (See Comments)    Muscle pain. Pt able to take Crestor but not everyday.   Jardiance [Empagliflozin] Nausea Only   Meperidine Hcl Rash     Past Medical History:  Diagnosis Date   Arthritis    Breast cancer ([place])    Breast cancer of upper-outer quadrant of right female breast ([place]) 03/24/2015   Carpal tunnel syndrome    Diabetes mellitus without complication ([place])    type 2   GERD (gastroesophageal reflux disease)    Pt takes OTC when needed.   Hot flashes     Past Surgical History:  Procedure Laterality Date   ABDOMINAL HYSTERECTOMY     APPENDECTOMY     COLONOSCOPY     ESOPHAGOGASTRODUODENOSCOPY     EYE SURGERY Bilateral    Cataracts removed   JOINT REPLACEMENT     RADIOACTIVE SEED GUIDED PARTIAL MASTECTOMY WITH AXILLARY SENTINEL LYMPH NODE BIOPSY Right 04/16/2015   Procedure: RADIOACTIVE SEED GUIDED PARTIAL MASTECTOMY WITH AXILLARY SENTINEL LYMPH NODE BIOPSY;  Surgeon: Alphonsa Overall, MD;  Location: [facility];  Service: General;  Laterality: Right;   SHOULDER SURGERY Right    TONSILLECTOMY     TOTAL KNEE ARTHROPLASTY Right 07/18/2013   DR GRAVES   TOTAL KNEE ARTHROPLASTY Right 07/18/2013   Procedure: TOTAL KNEE ARTHROPLASTY;  Surgeon: Alta Corning, MD;  Location: [facility];  Service: Orthopedics;  Laterality: Right;   TOTAL KNEE ARTHROPLASTY Left 06/15/2017   Procedure: LEFT TOTAL KNEE ARTHROPLASTY;  Surgeon: Dorna Leitz, MD;  Location: [facility];  Service: Orthopedics;  Laterality: Left;    No family history on file.  Social History:  reports that she has never smoked. She has never used smokeless tobacco. She reports current alcohol use. She reports that she does not use drugs.  Review of Systems:  Lipids:  She was switched from Crestor to Livalo because of her difficulty tolerating this because of joint pains . She is taking Livalo 4 mg and again has been able to take this daily without excessive joint and muscle pains Also has been taking EZETIMIBE since previously her LDL was 167   Previously baseline LDL has been as high as 200 LDL has improved significantly, she has been able to get  authorization from insurance for Livalo now    Lab Results  Component Value Date   CHOL 149 11/18/2021   CHOL 183 04/07/2021   CHOL 253 (H) 01/06/2021   Lab Results  Component Value Date   HDL 63.90 11/18/2021   HDL 80.80 04/07/2021   HDL 73.00 01/06/2021   Lab Results  Component Value Date   LDLCALC 72 11/18/2021   LDLCALC 88 04/07/2021   LDLCALC 167 (H) 01/06/2021   Lab Results  Component Value Date   TRIG 63.0 11/18/2021   TRIG 70.0 04/07/2021   TRIG 64.0 01/06/2021   Lab Results  Component Value Date   CHOLHDL 2 11/18/2021   CHOLHDL 2 04/07/2021   CHOLHDL 3 01/06/2021   Lab Results  Component Value Date   LDLDIRECT 120.1 09/30/2013   LDLDIRECT 115.5 03/24/2013   LDLDIRECT 204.9 11/19/2012     She is using the Catapres patch prescribed by her oncologist mostly for sweating episodes and not for hypertension   BP Readings from  Last 3 Encounters:  11/25/21 124/64  07/22/21 (!) 144/72  04/15/21 132/68    Foot exam done in 6/21, normal exam  Eye exam: Usually regular She is seen by [facility]  She takes vitamin D 6000 units daily as advised by her PCP  Bone density was normal in 2016 done by PCP,  HYPERCALCEMIA: She has had variably high normal calcium levels since 6/19 Not on any HCTZ PTH not suppressed Calcium is as follows  Lab Results  Component Value Date   CALCIUM 10.2 11/18/2021   CALCIUM 10.5 07/15/2021   CALCIUM 10.3 04/07/2021   CALCIUM 11.3 (H) 01/06/2021   CALCIUM 10.2 07/22/2020   CALCIUM 10.2 07/15/2020   CALCIUM 10.3 04/06/2020   Lab Results  Component Value Date   PTH 29 10/11/2018   CALCIUM 10.2 11/18/2021   CAION 1.09 (L) 01/10/2009      Examination:     BP 124/64   Pulse 67   Ht 5' 2.5" (1.588 m)   Wt 178 lb 12.8 oz (81.1 kg)   SpO2 93%   BMI 32.18 kg/m   Body mass index is 32.18 kg/m.     ASSESSMENT/ PLAN:  Diabetes type 2 with mild obesity  See history of present illness for discussion of  current diabetes management, blood sugar patterns and problems identified  Her A1c has come back to 6.7  She is on Trulicity and Farxiga 5 mg daily Although this is not her best A1c she thinks she can do better with diet and exercise and needs some weight loss Also needs to monitor readings after meals Discussed implementing these changes Currently not having any side effects from [place] or Trulicity  Consider increasing dose of Farxiga if needed  HYPERCHOLESTEROLEMIA:  Much better controlled with taking Livalo consistently and also adding Zetia  She will continue the same regimen  Overall blood pressure is still well controlled and no adverse effects of Catapres patch Urine microalbumin normal  Follow-up in 4 months  There are no Patient Instructions on file for this visit.   [name] 11/25/2021, 9:39 AM

## 2021-11-25 NOTE — Patient Instructions (Signed)
Take 2 Farxiga '5mg'$   Check blood sugars on waking up 2 days a week  Also check blood sugars about 2 hours after meals and do this after different meals by rotation  Recommended blood sugar levels on waking up are 90-130 and about 2 hours after meal is 130-160  Please bring your blood sugar monitor to each visit, thank you

## 2021-12-12 ENCOUNTER — Other Ambulatory Visit: Payer: Self-pay | Admitting: Endocrinology

## 2021-12-15 ENCOUNTER — Ambulatory Visit (INDEPENDENT_AMBULATORY_CARE_PROVIDER_SITE_OTHER): Payer: Medicare Other

## 2021-12-15 ENCOUNTER — Ambulatory Visit (INDEPENDENT_AMBULATORY_CARE_PROVIDER_SITE_OTHER): Payer: Medicare Other | Admitting: Podiatry

## 2021-12-15 ENCOUNTER — Encounter: Payer: Self-pay | Admitting: Podiatry

## 2021-12-15 DIAGNOSIS — M778 Other enthesopathies, not elsewhere classified: Secondary | ICD-10-CM

## 2021-12-15 DIAGNOSIS — M899 Disorder of bone, unspecified: Secondary | ICD-10-CM | POA: Diagnosis not present

## 2021-12-16 NOTE — Progress Notes (Signed)
She presents today concerned about the medial border of the hallux right for we removed a nail border with the matrixectomy back in June she states that she still having severe pain in that area she states that she had a little infection after the procedure in June of which healed but it never really went away the pain and that she still is having problems wearing shoe gear.  Objective: Vital signs are stable she is alert and oriented x3 pulses are palpable.  The medial border of the right hallux demonstrates a very hard margin when compared to the contralateral foot.  Radiographs were taken today on suspicion of a subungual exostosis which is confirmed.  This was confirmed by radiographs today  Assessment: Subungual exostosis hallux right tibial border.  Plan: At this point we schedule her for a subungual exostectomy to that lateral border.  We did discuss the possible postop complications associated with this she understands and is amenable to it.  We will follow-up with her at the time of surgery.

## 2021-12-19 ENCOUNTER — Telehealth: Payer: Self-pay | Admitting: Podiatry

## 2021-12-19 NOTE — Telephone Encounter (Signed)
DOS: 01/06/2022  [employer] Medicare  Exostectomy 1st Rt (01410)  DX: M89.9  Deductible: $0 Out-of-Pocket: $0 CoInsurance: 0%  Prior authorization is not required per Destiny F.  Call Reference #: [id number]

## 2021-12-26 ENCOUNTER — Other Ambulatory Visit: Payer: Self-pay | Admitting: Endocrinology

## 2021-12-26 DIAGNOSIS — E7800 Pure hypercholesterolemia, unspecified: Secondary | ICD-10-CM

## 2022-01-02 ENCOUNTER — Telehealth: Payer: Self-pay

## 2022-01-02 NOTE — Telephone Encounter (Signed)
Amanda [name] called to cancel her surgery with Dr. Milinda Pointer on 01/06/2022. She stated she had something come and and needs to go out of town. She stated she will call back to reschedule. Notified Dr. Milinda Pointer and Caren Griffins with [facility].

## 2022-01-12 ENCOUNTER — Encounter: Payer: Medicare Other | Admitting: Podiatry

## 2022-01-19 ENCOUNTER — Encounter: Payer: Medicare Other | Admitting: Podiatry

## 2022-02-01 ENCOUNTER — Other Ambulatory Visit: Payer: Self-pay | Admitting: Endocrinology

## 2022-02-02 ENCOUNTER — Encounter: Payer: Medicare Other | Admitting: Podiatry

## 2022-02-02 ENCOUNTER — Other Ambulatory Visit: Payer: Self-pay | Admitting: Podiatry

## 2022-02-02 MED ORDER — OXYCODONE-ACETAMINOPHEN 10-325 MG PO TABS: 1.0000 | ORAL_TABLET | Freq: Three times a day (TID) | ORAL | 0 refills | Status: AC | PRN

## 2022-02-02 MED ORDER — ONDANSETRON HCL 4 MG PO TABS: 4.0000 mg | ORAL_TABLET | Freq: Three times a day (TID) | ORAL | 0 refills | Status: DC | PRN

## 2022-02-02 MED ORDER — CEPHALEXIN 500 MG PO CAPS: 500.0000 mg | ORAL_CAPSULE | Freq: Three times a day (TID) | ORAL | 0 refills | Status: DC

## 2022-02-03 DIAGNOSIS — L600 Ingrowing nail: Secondary | ICD-10-CM | POA: Diagnosis not present

## 2022-02-03 DIAGNOSIS — M25774 Osteophyte, right foot: Secondary | ICD-10-CM | POA: Diagnosis not present

## 2022-02-09 ENCOUNTER — Encounter: Payer: Self-pay | Admitting: Podiatry

## 2022-02-09 ENCOUNTER — Ambulatory Visit (INDEPENDENT_AMBULATORY_CARE_PROVIDER_SITE_OTHER): Payer: Medicare Other | Admitting: Podiatry

## 2022-02-09 ENCOUNTER — Ambulatory Visit (INDEPENDENT_AMBULATORY_CARE_PROVIDER_SITE_OTHER): Payer: Medicare Other

## 2022-02-09 DIAGNOSIS — M899 Disorder of bone, unspecified: Secondary | ICD-10-CM

## 2022-02-09 DIAGNOSIS — Z9889 Other specified postprocedural states: Secondary | ICD-10-CM

## 2022-02-09 NOTE — Progress Notes (Signed)
She presents today for her first postop visit date of surgery 02/03/2022 digital exostectomy hallux right with a surgical matricectomy to the fibular border.  Objective: Vital signs stable alert oriented x 3 there is no erythema edema salines drainage odor sutures are intact.  Toe is exquisitely tender.  No signs of infection radiographs taken today demonstrate an osseously mature foot with mild edema to the first toe there are some small spicules of bone that probably did give that he should go on and resolved.  Assessment: Well-healing surgical foot status post subungual exostectomy.  Plan: Redressed today dressed compressive dressing follow-up with her in 1 week

## 2022-02-16 ENCOUNTER — Ambulatory Visit (INDEPENDENT_AMBULATORY_CARE_PROVIDER_SITE_OTHER): Payer: Medicare Other | Admitting: Podiatry

## 2022-02-16 ENCOUNTER — Encounter: Payer: Medicare Other | Admitting: Podiatry

## 2022-02-16 VITALS — BP 114/57 | HR 72

## 2022-02-16 DIAGNOSIS — M899 Disorder of bone, unspecified: Secondary | ICD-10-CM

## 2022-02-16 DIAGNOSIS — Z9889 Other specified postprocedural states: Secondary | ICD-10-CM

## 2022-02-16 NOTE — Progress Notes (Signed)
She presents today for second postop visit date of surgery was 02/03/2022 digital exostectomy and fibular border nail removal.  She states that it is a little tender but doing much better.  Objective: Vital signs stable alert oriented x 3.  Pulses are palpable.  There is no erythema edema salines drainage odor sutures are intact.  No purulence no malodor no signs of infection.  Assessment: Well-healed surgical toe.  Plan: Sutures removed today redressed today dressed a compressive dressing follow-up with me in 2 weeks.  Continue use of the Darco shoe and allow her to start showering.

## 2022-02-17 ENCOUNTER — Telehealth: Payer: Self-pay | Admitting: Endocrinology

## 2022-02-17 NOTE — Telephone Encounter (Signed)
Patient brought [place] Application for free [employer] in office today for completion.  Application is in Dr. Ronnie Derby folder in front office.

## 2022-02-20 NOTE — Telephone Encounter (Signed)
Noted and placed on providers desk for signature.

## 2022-03-01 ENCOUNTER — Other Ambulatory Visit: Payer: Self-pay

## 2022-03-01 MED ORDER — CLONIDINE 0.1 MG/24HR TD PTWK: MEDICATED_PATCH | TRANSDERMAL | 5 refills | Status: DC

## 2022-03-02 ENCOUNTER — Encounter: Payer: Self-pay | Admitting: Podiatry

## 2022-03-02 ENCOUNTER — Encounter: Payer: Medicare Other | Admitting: Podiatry

## 2022-03-02 ENCOUNTER — Ambulatory Visit (INDEPENDENT_AMBULATORY_CARE_PROVIDER_SITE_OTHER): Payer: Medicare Other | Admitting: Podiatry

## 2022-03-02 DIAGNOSIS — Z9889 Other specified postprocedural states: Secondary | ICD-10-CM

## 2022-03-02 DIAGNOSIS — M899 Disorder of bone, unspecified: Secondary | ICD-10-CM

## 2022-03-02 NOTE — Progress Notes (Signed)
Presents today for follow-up of her exostectomy to the right.  Date of surgery 02/03/2022.  She states that it is so much better she is hardly expressed her gratitude.  She states that is feeling much better than it was but is not ready for shoe yet.  Continues to take Tylenol for discomfort.  Objective: Vital signs stable oriented x 3 there is no erythematous mild edema some postinflammatory hyperpigmentation around the hallux right no purulence no malodor no signs of infection mildly tender on palpation.  Assessment: Well-healing surgical toe hallux right.  Plan: Discussed etiology pathology and surgical therapies recommended she start getting some lotion on the toe tried different shoes trying to get the sensation to return to normal.  I will follow-up with her in about 1 month at which time we plan to discharge

## 2022-03-02 NOTE — Telephone Encounter (Signed)
Application completed and faxed

## 2022-03-02 NOTE — Telephone Encounter (Signed)
Application for [employer] was old application. Called patient she came back to complete new one. Put back on kumar desk to sign. [place] application was sent.

## 2022-03-07 ENCOUNTER — Other Ambulatory Visit: Payer: Self-pay

## 2022-03-07 DIAGNOSIS — E1165 Type 2 diabetes mellitus with hyperglycemia: Secondary | ICD-10-CM

## 2022-03-07 MED ORDER — TRULICITY 0.75 MG/0.5ML ~~LOC~~ SOAJ: 0.7500 mg | SUBCUTANEOUS | 3 refills | Status: DC

## 2022-03-09 ENCOUNTER — Other Ambulatory Visit (INDEPENDENT_AMBULATORY_CARE_PROVIDER_SITE_OTHER): Payer: Medicare Other

## 2022-03-09 DIAGNOSIS — Z794 Long term (current) use of insulin: Secondary | ICD-10-CM | POA: Diagnosis not present

## 2022-03-09 DIAGNOSIS — E1165 Type 2 diabetes mellitus with hyperglycemia: Secondary | ICD-10-CM | POA: Diagnosis not present

## 2022-03-09 LAB — BASIC METABOLIC PANEL
BUN: 21 mg/dL (ref 6–23)
CO2: 30 mEq/L (ref 19–32)
Calcium: 10.6 mg/dL — ABNORMAL HIGH (ref 8.4–10.5)
Chloride: 104 mEq/L (ref 96–112)
Creatinine, Ser: 0.85 mg/dL (ref 0.40–1.20)
GFR: 66.4 mL/min (ref >60.00)
Glucose, Bld: 128 mg/dL — ABNORMAL HIGH (ref 70–99)
Potassium: 4.5 mEq/L (ref 3.5–5.1)
Sodium: 141 mEq/L (ref 135–145)

## 2022-03-09 LAB — HEMOGLOBIN A1C: Hgb A1c MFr Bld: 6.9 % — ABNORMAL HIGH (ref 4.6–6.5)

## 2022-03-16 ENCOUNTER — Encounter: Payer: Self-pay | Admitting: Endocrinology

## 2022-03-16 ENCOUNTER — Ambulatory Visit (INDEPENDENT_AMBULATORY_CARE_PROVIDER_SITE_OTHER): Payer: Medicare Other | Admitting: Endocrinology

## 2022-03-16 VITALS — BP 136/68 | HR 66 | Ht 62.5 in | Wt 183.0 lb

## 2022-03-16 DIAGNOSIS — Z794 Long term (current) use of insulin: Secondary | ICD-10-CM | POA: Diagnosis not present

## 2022-03-16 DIAGNOSIS — E782 Mixed hyperlipidemia: Secondary | ICD-10-CM | POA: Diagnosis not present

## 2022-03-16 DIAGNOSIS — E1165 Type 2 diabetes mellitus with hyperglycemia: Secondary | ICD-10-CM | POA: Diagnosis not present

## 2022-03-16 NOTE — Progress Notes (Signed)
Amanda [name] is a 77 y.o. female.             Reason for Appointment: Endocrinology follow-up   Initial diagnosis of diabetes:?  2007   History of Present Illness   Diagnosis: Type 2 DIABETES MELITUS     Previous history: She has had difficulty with blood sugar control initially because of limited tolerability with metformin and difficulty with weight loss. She did very well with starting Victoza which has helped her blood sugar control as well as some improvement in her obesity Her insurance company did not prefer Victoza and she was switched to Trulicity Subsequently her glipizide was stopped She was started on Invokana 100 mg daily in addition to her Trulicity 3.78 mg when her blood sugars were higher in 2017  Recent history:    Non-insulin hypoglycemic drugs:  Trulicity 5.88 mg weekly, Farxiga 10 mg daily   Current blood sugar patterns, management and problems identified:  Her A1c is 6.9 and slightly better May be benefiting from increasing her [place] to 10 mg which she is tolerating She has only 5 readings in the last month Blood sugar range 113-167 average 128 with higher reading after breakfast Has only 1 reading after dinner Lab glucose 128 Because of her foot problems she has not exercised Weight is slightly higher      Side effects from medications: Diarrhea from metformin.  Nausea from 1.5 mg Trulicity         Monitors blood glucose:  less than Once a day.    Glucometer: One Touch ultra 2  Blood sugars as above  Physical activity: exercise: walking on treadmill 1-2 days per week upto 60 min            Wt Readings from Last 3 Encounters:  03/16/22 183 lb (83 kg)  11/25/21 178 lb 12.8 oz (81.1 kg)  07/22/21 177 lb 6.4 oz (80.5 kg)    LABS:  Lab Results  Component Value Date   HGBA1C 6.9 (H) 03/09/2022   HGBA1C 7.1 (H) 11/18/2021   HGBA1C 6.7 (A) 07/22/2021   Lab Results  Component Value Date   MICROALBUR <0.7 07/15/2021   LDLCALC 72 11/18/2021    CREATININE 0.85 03/09/2022   Other active problems discussed today: See review of systems   No visits with results within 1 Week(s) from this visit.  Latest known visit with results is:  Lab on 03/09/2022  Component Date Value Ref Range Status   Sodium 03/09/2022 141  135 - 145 mEq/L Final   Potassium 03/09/2022 4.5  3.5 - 5.1 mEq/L Final   Chloride 03/09/2022 104  96 - 112 mEq/L Final   CO2 03/09/2022 30  19 - 32 mEq/L Final   Glucose, Bld 03/09/2022 128 (H)  70 - 99 mg/dL Final   BUN 03/09/2022 21  6 - 23 mg/dL Final   Creatinine, Ser 03/09/2022 0.85  0.40 - 1.20 mg/dL Final   GFR 03/09/2022 66.40  >60.00 mL/min Final   Calculated using the CKD-EPI Creatinine Equation (2021)   Calcium 03/09/2022 10.6 (H)  8.4 - 10.5 mg/dL Final   Hgb A1c MFr Bld 03/09/2022 6.9 (H)  4.6 - 6.5 % Final   Glycemic Control Guidelines for People with Diabetes:Non Diabetic:  <6%Goal of Therapy: <7%Additional Action Suggested:  >8%     Allergies as of 03/16/2022       Reactions   Statins Other (See Comments)   Muscle pain. Pt able to take Crestor but not everyday.  Jardiance [empagliflozin] Nausea Only   Meperidine Hcl Rash        Medication List        Accurate as of March 16, 2022 10:07 AM. If you have any questions, ask your nurse or doctor.          amoxicillin 500 MG capsule Commonly known as: AMOXIL Take 500 mg by mouth every 8 (eight) hours.   cephALEXin 500 MG capsule Commonly known as: KEFLEX Take 1 capsule (500 mg total) by mouth 3 (three) times daily.   cloNIDine 0.1 mg/24hr patch Commonly known as: CATAPRES - Dosed in mg/24 hr APPLY 1 PATCH TOPICALLY TO THE SKIN EVERY WEEK   dapagliflozin propanediol 10 MG Tabs tablet Commonly known as: Farxiga Take 1 tablet (10 mg total) by mouth daily.   ezetimibe 10 MG tablet Commonly known as: ZETIA TAKE 1 TABLET(10 MG) BY MOUTH DAILY   Flarex 0.1 % ophthalmic suspension Generic drug: fluorometholone   glucose blood  test strip Use Onetouch Ultra test strips as instructed to check blood sugar once daily.   ondansetron 4 MG tablet Commonly known as: Zofran Take 1 tablet (4 mg total) by mouth every 8 (eight) hours as needed.   ONE TOUCH ULTRA 2 w/Device Kit 1 each by Does not apply route daily. Use daily to check blood sugar.   Pitavastatin Calcium 4 MG Tabs Commonly known as: Livalo TAKE 1 TABLET(4 MG) BY MOUTH DAILY   Trulicity 2.42 AS/3.4HD Sopn Generic drug: Dulaglutide Inject 0.75 mg into the skin once a week.   Vitamin D3 50 MCG (2000 UT) Tabs Take 6,000 Units by mouth daily.        Allergies:  Allergies  Allergen Reactions   Statins Other (See Comments)    Muscle pain. Pt able to take Crestor but not everyday.   Jardiance [Empagliflozin] Nausea Only   Meperidine Hcl Rash    Past Medical History:  Diagnosis Date   Arthritis    Breast cancer ([place])    Breast cancer of upper-outer quadrant of right female breast ([place]) 03/24/2015   Carpal tunnel syndrome    Diabetes mellitus without complication ([place])    type 2   GERD (gastroesophageal reflux disease)    Pt takes OTC when needed.   Hot flashes     Past Surgical History:  Procedure Laterality Date   ABDOMINAL HYSTERECTOMY     APPENDECTOMY     COLONOSCOPY     ESOPHAGOGASTRODUODENOSCOPY     EYE SURGERY Bilateral    Cataracts removed   JOINT REPLACEMENT     RADIOACTIVE SEED GUIDED PARTIAL MASTECTOMY WITH AXILLARY SENTINEL LYMPH NODE BIOPSY Right 04/16/2015   Procedure: RADIOACTIVE SEED GUIDED PARTIAL MASTECTOMY WITH AXILLARY SENTINEL LYMPH NODE BIOPSY;  Surgeon: Alphonsa Overall, MD;  Location: [facility];  Service: General;  Laterality: Right;   SHOULDER SURGERY Right    TONSILLECTOMY     TOTAL KNEE ARTHROPLASTY Right 07/18/2013   DR GRAVES   TOTAL KNEE ARTHROPLASTY Right 07/18/2013   Procedure: TOTAL KNEE ARTHROPLASTY;  Surgeon: Alta Corning, MD;  Location: [facility];  Service: Orthopedics;  Laterality: Right;    TOTAL KNEE ARTHROPLASTY Left 06/15/2017   Procedure: LEFT TOTAL KNEE ARTHROPLASTY;  Surgeon: Dorna Leitz, MD;  Location: [facility];  Service: Orthopedics;  Laterality: Left;    No family history on file.  Social History:  reports that she has never smoked. She has never used smokeless tobacco. She reports current alcohol use. She reports that she does  not use drugs.  Review of Systems:  Lipids:  She was switched from Crestor to Livalo because of her difficulty tolerating this because of joint pains . She is taking Livalo 4 mg and has been able to take this daily without joint and muscle pains Also has been taking EZETIMIBE since previously her LDL was 167   Previously baseline LDL has been as high as 200 LDL has been consistently 70 or below     Lab Results  Component Value Date   CHOL 149 11/18/2021   CHOL 183 04/07/2021   CHOL 253 (H) 01/06/2021   Lab Results  Component Value Date   HDL 63.90 11/18/2021   HDL 80.80 04/07/2021   HDL 73.00 01/06/2021   Lab Results  Component Value Date   LDLCALC 72 11/18/2021   LDLCALC 88 04/07/2021   LDLCALC 167 (H) 01/06/2021   Lab Results  Component Value Date   TRIG 63.0 11/18/2021   TRIG 70.0 04/07/2021   TRIG 64.0 01/06/2021   Lab Results  Component Value Date   CHOLHDL 2 11/18/2021   CHOLHDL 2 04/07/2021   CHOLHDL 3 01/06/2021   Lab Results  Component Value Date   LDLDIRECT 120.1 09/30/2013   LDLDIRECT 115.5 03/24/2013   LDLDIRECT 204.9 11/19/2012     She is using the Catapres patch prescribed by her oncologist mostly for sweating episodes and not for hypertension   BP Readings from Last 3 Encounters:  03/16/22 136/68  02/16/22 (!) 114/57  11/25/21 124/64    Foot exam done in 2023, normal exam  Eye exam: Usually regular She is seen by [facility] center  She takes vitamin D 6000 units daily as advised by her PCP  Bone density was normal in 2016 done by PCP,  HYPERCALCEMIA: She has had variably high  normal calcium levels since 6/19 Not on any HCTZ PTH not suppressed Calcium is as follows  Lab Results  Component Value Date   CALCIUM 10.6 (H) 03/09/2022   CALCIUM 10.2 11/18/2021   CALCIUM 10.5 07/15/2021   CALCIUM 10.3 04/07/2021   CALCIUM 11.3 (H) 01/06/2021   CALCIUM 10.2 07/22/2020   CALCIUM 10.2 07/15/2020   Lab Results  Component Value Date   PTH 29 10/11/2018   CALCIUM 10.6 (H) 03/09/2022   CAION 1.09 (L) 01/10/2009      Examination:     BP 136/68 (BP Location: Left Arm, Patient Position: Sitting, Cuff Size: Normal)   Pulse 66   Ht 5' 2.5" (1.588 m)   Wt 183 lb (83 kg)   SpO2 97%   BMI 32.94 kg/m   Body mass index is 32.94 kg/m.    ASSESSMENT/ PLAN:  Diabetes type 2 with mild obesity  See history of present illness for discussion of current diabetes management, blood sugar patterns and problems identified  Her A1c has improved slightly at 6.9  She is on Trulicity and Farxiga 10 mg daily  As before she has minimal glucose monitoring lately Currently not exercising and hopefully she can restart walking on her treadmill which will help Does need more consistent monitoring especially after meals  HYPERCHOLESTEROLEMIA:  Will need follow-up in the next visit  Follow-up in 4 months  There are no Patient Instructions on file for this visit.   [name] 03/16/2022, 10:07 AM

## 2022-03-16 NOTE — Patient Instructions (Addendum)
Check blood sugars on waking up days 2 a week  Also check blood sugars about 2 hours after meals and do this after different meals by rotation  Recommended blood sugar levels on waking up are 90-130 and about 2 hours after meal is 130-160  Please bring your blood sugar monitor to each visit, thank you  No chan

## 2022-03-21 ENCOUNTER — Encounter: Payer: Medicare Other | Admitting: Podiatry

## 2022-03-30 ENCOUNTER — Encounter: Payer: Medicare Other | Admitting: Podiatry

## 2022-04-02 ENCOUNTER — Other Ambulatory Visit: Payer: Self-pay | Admitting: Endocrinology

## 2022-04-02 DIAGNOSIS — E7800 Pure hypercholesterolemia, unspecified: Secondary | ICD-10-CM

## 2022-04-13 ENCOUNTER — Encounter: Payer: Medicare Other | Admitting: Podiatry

## 2022-04-27 ENCOUNTER — Encounter: Payer: Self-pay | Admitting: Podiatry

## 2022-04-27 ENCOUNTER — Ambulatory Visit (INDEPENDENT_AMBULATORY_CARE_PROVIDER_SITE_OTHER): Payer: Medicare Other | Admitting: Podiatry

## 2022-04-27 VITALS — BP 116/59 | HR 71

## 2022-04-27 DIAGNOSIS — M899 Disorder of bone, unspecified: Secondary | ICD-10-CM

## 2022-04-27 DIAGNOSIS — Z9889 Other specified postprocedural states: Secondary | ICD-10-CM

## 2022-04-27 MED ORDER — GABAPENTIN 100 MG PO CAPS: 100.0000 mg | ORAL_CAPSULE | Freq: Two times a day (BID) | ORAL | 3 refills | Status: DC

## 2022-04-27 NOTE — Progress Notes (Signed)
She presents today for follow-up of her exostectomy hallux right she states is still sore with a regular shoe she states this nerve is really starting to get on the last  Date of surgery was 02/03/2022.  Objective: Surgical site is healing visibly much decrease in edema she still has neurologic type symptomatology to the toe with a sharp stabbing pain on palpation and sometimes just out of the blue she says even just touching the skin is exquisitely tender to her.  Assessment: Surgical sites: Healed uneventfully but is nonpalpable.  She does have some left neurologic underlying issue possibly associated with her diabetes.  Plan: At this point I will start her on gabapentin 100 mg 1 p.o. twice daily number to follow-up with her in 1 month.  May need to increase that

## 2022-05-08 ENCOUNTER — Other Ambulatory Visit: Payer: Self-pay

## 2022-05-08 MED ORDER — FARXIGA 10 MG PO TABS: 10.0000 mg | ORAL_TABLET | Freq: Every day | ORAL | 5 refills | Status: DC

## 2022-05-25 ENCOUNTER — Ambulatory Visit: Payer: Medicare Other | Admitting: Podiatry

## 2022-05-25 ENCOUNTER — Ambulatory Visit (INDEPENDENT_AMBULATORY_CARE_PROVIDER_SITE_OTHER): Payer: Medicare Other | Admitting: Podiatry

## 2022-05-25 DIAGNOSIS — M79674 Pain in right toe(s): Secondary | ICD-10-CM | POA: Diagnosis not present

## 2022-05-25 DIAGNOSIS — M899 Disorder of bone, unspecified: Secondary | ICD-10-CM

## 2022-05-25 DIAGNOSIS — G8929 Other chronic pain: Secondary | ICD-10-CM

## 2022-05-25 DIAGNOSIS — Z9889 Other specified postprocedural states: Secondary | ICD-10-CM

## 2022-05-25 NOTE — Progress Notes (Signed)
She presents today states that her pain to the hallux right is still approximately an 8 out of 10 exquisitely tender until she places Band-Aids on the toes.  States that she cannot wear closed toed shoes and that her gabapentin may have helped some but really not that much at all she can comment on.  Objective: Vitals are stable she alert oriented x 3 toe is mildly edematous to the distal aspect for surgery was performed.  That has gone on to heal up uneventfully at this point.  She still has tenderness on palpation of the toenail and severe tenderness on palpation distal aspect of the toe at this point she has good range of motion of the metatarsophalangeal joint of the hallux interphalangeal joint is exquisitely tender.  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  Surgical correction of the toenail as well as exostectomy has not rendered this patient asymptomatic.  Plan: Discussed etiology pathology and surgical therapies at this point requesting MRI to evaluate for any osseous abnormalities that are not visible on radiograph.  This is for possible surgical intervention as well as differential diagnoses.  We are also going to increase her gabapentin 300 mg at night.

## 2022-05-26 IMAGING — CR DG CHEST 2V
2 series · 2 of 2 positions shown · non-contrast
Comparison: 06/07/2017

CLINICAL DATA: Shortness of breath

EXAM:
CHEST - 2 VIEW

[w chest pa]
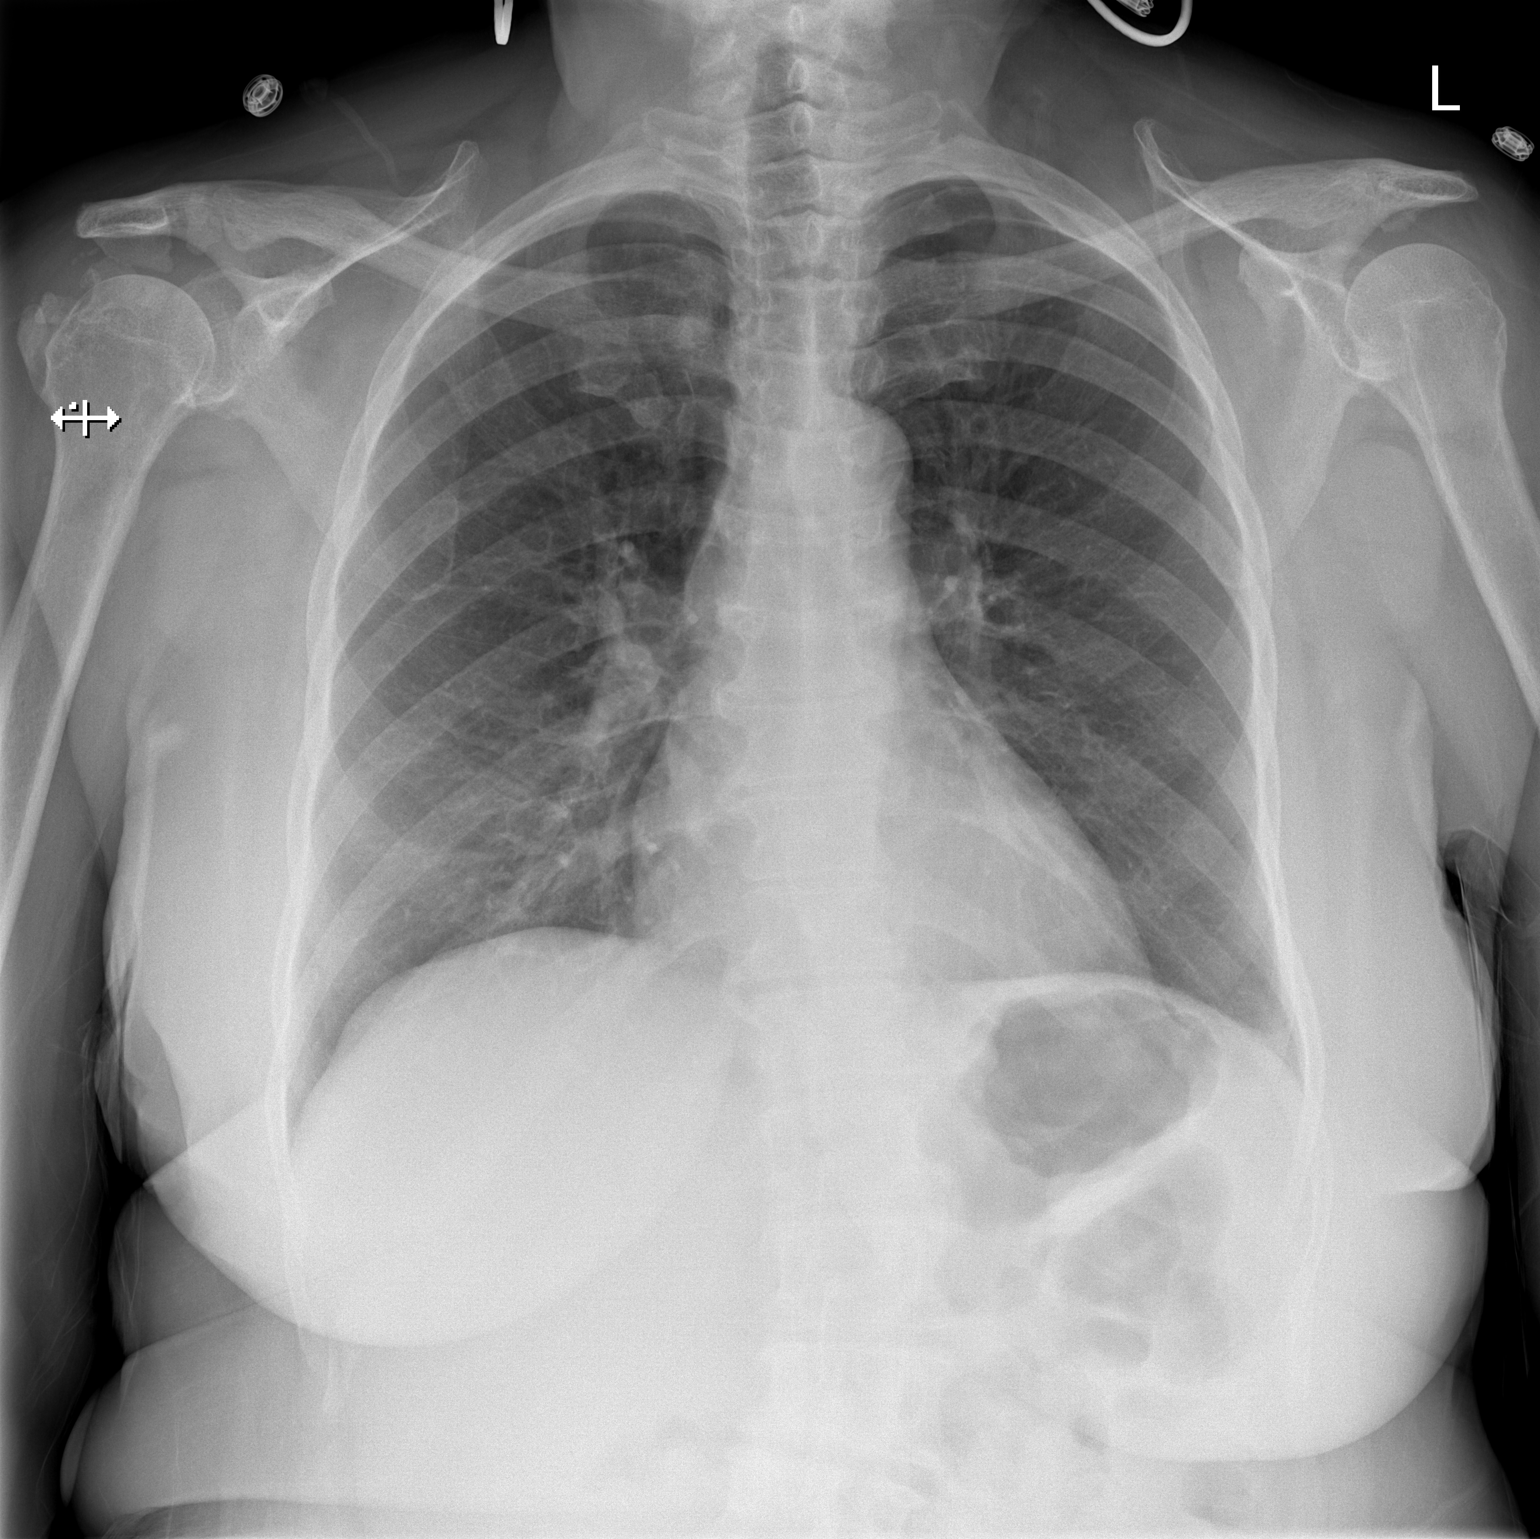

[w chest lat]
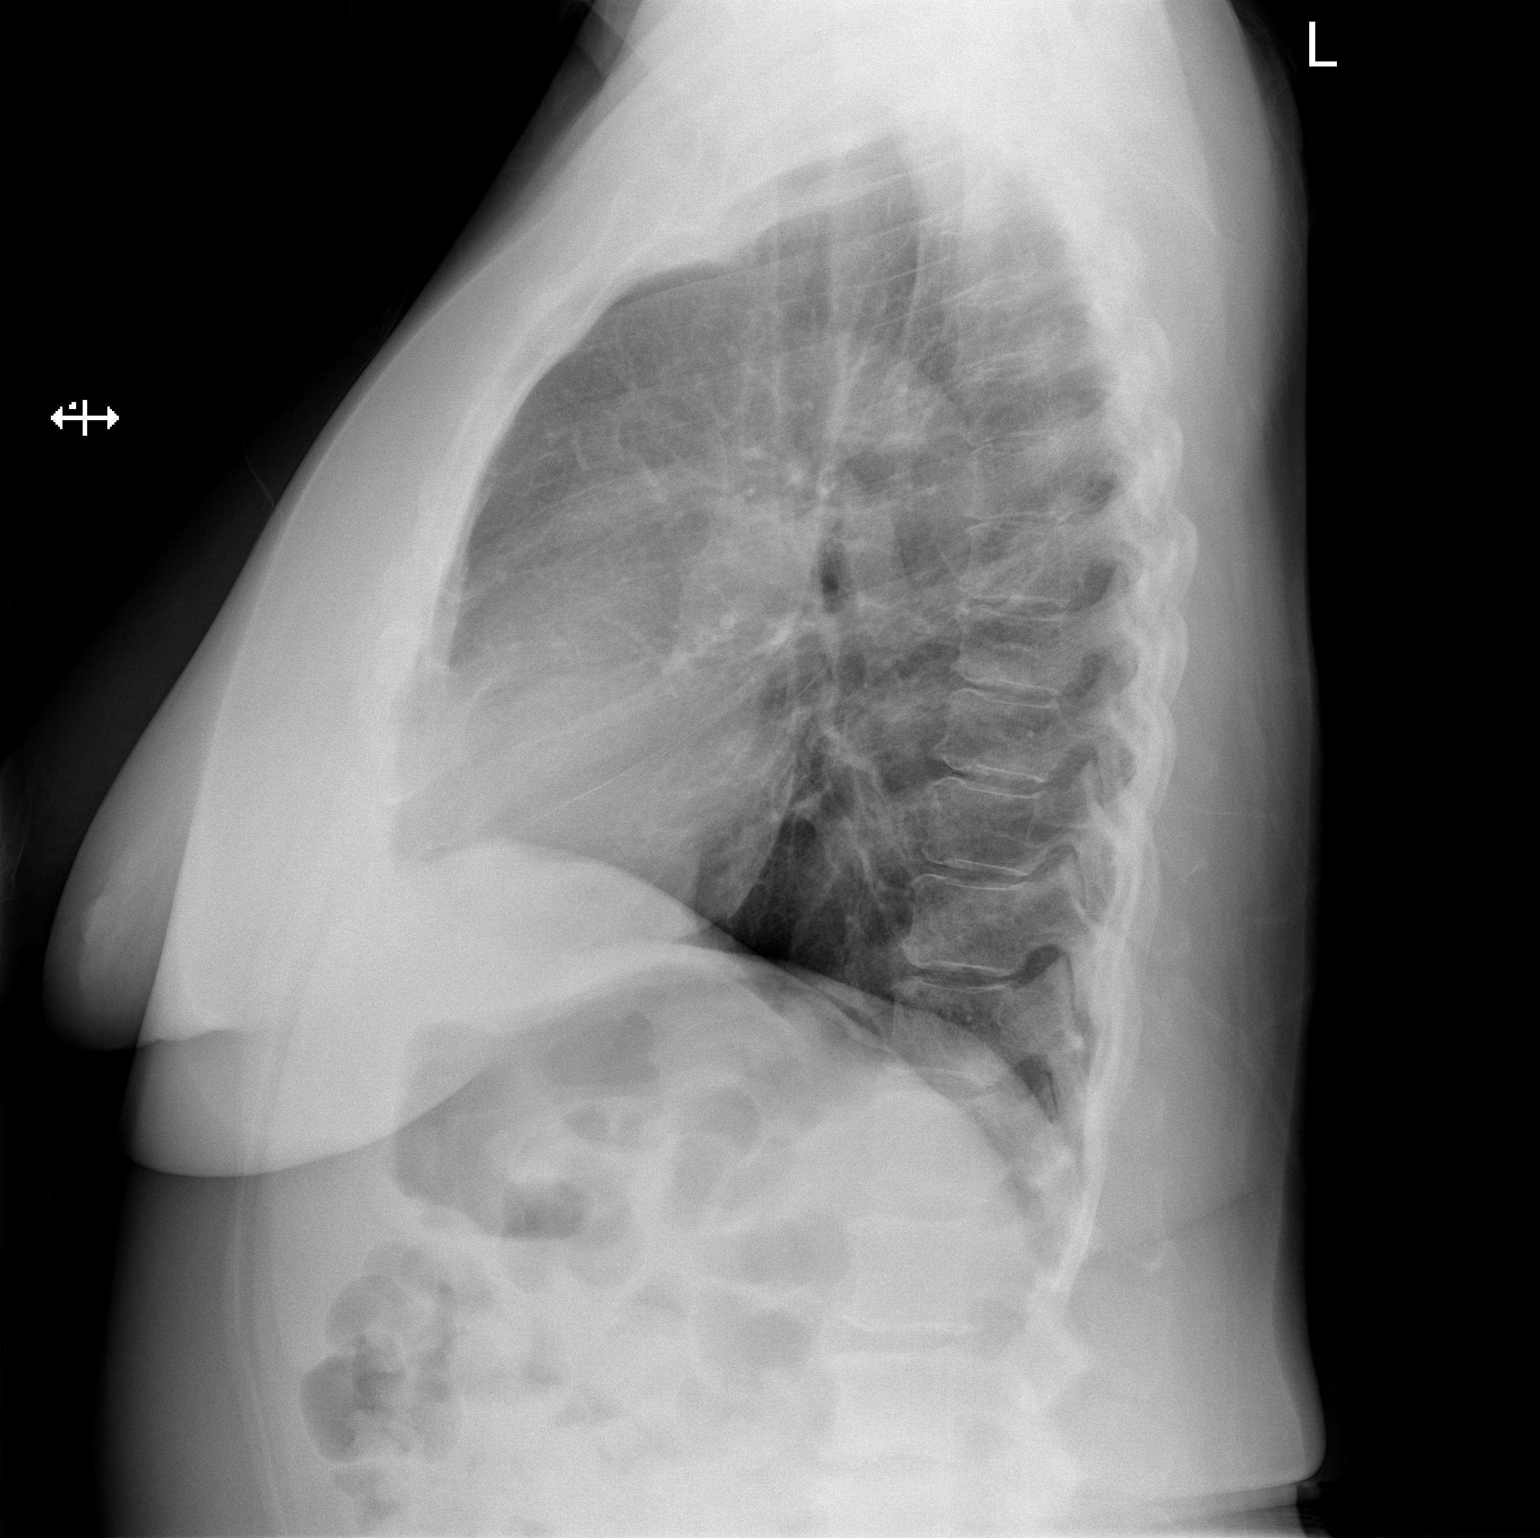

[2 of 2 positions shown; findings below may reference images not displayed]

FINDINGS: The heart size and mediastinal contours are within normal limits.
Both lungs are clear. The visualized skeletal structures are
unremarkable.
IMPRESSION: No active cardiopulmonary disease.

## 2022-06-02 ENCOUNTER — Telehealth: Payer: Self-pay | Admitting: *Deleted

## 2022-06-02 NOTE — Telephone Encounter (Signed)
Patient is calling for status of MRI, was given DRI information to contact for status thru voice message.

## 2022-06-16 ENCOUNTER — Encounter: Payer: Self-pay | Admitting: Podiatry

## 2022-06-22 ENCOUNTER — Ambulatory Visit
Admission: RE | Admit: 2022-06-22 | Discharge: 2022-06-22 | Disposition: A | Discharge status: Home / Self Care | Payer: Medicare Other | Source: Ambulatory Visit | Attending: Podiatry | Admitting: Podiatry

## 2022-06-22 DIAGNOSIS — G8929 Other chronic pain: Secondary | ICD-10-CM

## 2022-06-22 DIAGNOSIS — Z9889 Other specified postprocedural states: Secondary | ICD-10-CM

## 2022-06-29 LAB — COLOGUARD: COLOGUARD: POSITIVE — AB

## 2022-07-13 ENCOUNTER — Other Ambulatory Visit: Payer: Medicare Other

## 2022-07-17 ENCOUNTER — Telehealth: Payer: Self-pay | Admitting: Podiatry

## 2022-07-17 NOTE — Telephone Encounter (Signed)
Pt called to inquire about MRI results

## 2022-07-18 ENCOUNTER — Other Ambulatory Visit (INDEPENDENT_AMBULATORY_CARE_PROVIDER_SITE_OTHER): Payer: Medicare Other

## 2022-07-18 DIAGNOSIS — E1165 Type 2 diabetes mellitus with hyperglycemia: Secondary | ICD-10-CM

## 2022-07-18 DIAGNOSIS — Z794 Long term (current) use of insulin: Secondary | ICD-10-CM | POA: Diagnosis not present

## 2022-07-18 DIAGNOSIS — E782 Mixed hyperlipidemia: Secondary | ICD-10-CM | POA: Diagnosis not present

## 2022-07-18 LAB — COMPREHENSIVE METABOLIC PANEL
ALT: 19 U/L (ref 0–35)
AST: 20 U/L (ref 0–37)
Albumin: 4.3 g/dL (ref 3.5–5.2)
Alkaline Phosphatase: 54 U/L (ref 39–117)
BUN: 23 mg/dL (ref 6–23)
CO2: 27 mEq/L (ref 19–32)
Calcium: 10.4 mg/dL (ref 8.4–10.5)
Chloride: 105 mEq/L (ref 96–112)
Creatinine, Ser: 0.85 mg/dL (ref 0.40–1.20)
GFR: 66.24 mL/min (ref >60.00)
Glucose, Bld: 100 mg/dL — ABNORMAL HIGH (ref 70–99)
Potassium: 4.5 mEq/L (ref 3.5–5.1)
Sodium: 140 mEq/L (ref 135–145)
Total Bilirubin: 0.7 mg/dL (ref 0.2–1.2)
Total Protein: 7 g/dL (ref 6.0–8.3)

## 2022-07-18 LAB — MICROALBUMIN / CREATININE URINE RATIO
Creatinine,U: 54.2 mg/dL
Microalb Creat Ratio: 1.3 mg/g (ref 0.0–30.0)
Microalb, Ur: <0.7 mg/dL (ref 0.0–1.9)

## 2022-07-18 LAB — LIPID PANEL
Cholesterol: 189 mg/dL (ref 0–200)
HDL: 76.4 mg/dL (ref >39.00)
LDL Cholesterol: 97 mg/dL (ref 0–99)
NonHDL: 112.83
Total CHOL/HDL Ratio: 2
Triglycerides: 78 mg/dL (ref 0.0–149.0)
VLDL: 15.6 mg/dL (ref 0.0–40.0)

## 2022-07-18 LAB — HEMOGLOBIN A1C: Hgb A1c MFr Bld: 6.6 % — ABNORMAL HIGH (ref 4.6–6.5)

## 2022-07-20 ENCOUNTER — Ambulatory Visit (INDEPENDENT_AMBULATORY_CARE_PROVIDER_SITE_OTHER): Payer: Medicare Other | Admitting: Endocrinology

## 2022-07-20 VITALS — BP 141/78 | HR 63 | Ht 62.0 in | Wt 175.0 lb

## 2022-07-20 DIAGNOSIS — E119 Type 2 diabetes mellitus without complications: Secondary | ICD-10-CM

## 2022-07-20 DIAGNOSIS — Z7985 Long-term (current) use of injectable non-insulin antidiabetic drugs: Secondary | ICD-10-CM

## 2022-07-20 DIAGNOSIS — E7800 Pure hypercholesterolemia, unspecified: Secondary | ICD-10-CM | POA: Diagnosis not present

## 2022-07-20 DIAGNOSIS — Z7984 Long term (current) use of oral hypoglycemic drugs: Secondary | ICD-10-CM

## 2022-07-20 NOTE — Telephone Encounter (Signed)
Pt called again to see if MRI results are in. Please advise

## 2022-07-20 NOTE — Progress Notes (Signed)
Amanda [name] is a 77 y.o. female.             Reason for Appointment: Endocrinology follow-up   Initial diagnosis of diabetes:?  2007   History of Present Illness   Diagnosis: Type 2 DIABETES MELITUS     Previous history: She has had difficulty with blood sugar control initially because of limited tolerability with metformin and difficulty with weight loss. She did very well with starting Victoza which has helped her blood sugar control as well as some improvement in her obesity Her insurance company did not prefer Victoza and she was switched to Trulicity Subsequently her glipizide was stopped She was started on Invokana 100 mg daily in addition to her Trulicity 0.75 mg when her blood sugars were higher in 2017  Recent history:    Non-insulin hypoglycemic drugs:  Trulicity 0.75 mg weekly, Farxiga 10 mg daily   Current blood sugar patterns, management and problems identified:  Her A1c is improved slightly again and now 6.6 She has lost weight likely from cutting back on calories and meats She is taking her Trulicity regularly and may have only transient mild nausea No side effects with Marcelline Deist which she can afford and take regularly Lab glucose 100 Because of her foot problems she has not been able to do much walking except occasional exercise Weight has come down slightly  One Touch Verio monitor was downloaded with the following results  PRE-MEAL Fasting Lunch Dinner Bedtime Overall  Glucose range: ?      Mean/median:     135   POST-MEAL PC Breakfast PC Lunch PC Dinner  Glucose range: 147, 167 93-144   Mean/median:       Side effects from medications: Diarrhea from metformin.  Nausea from 1.5 mg Trulicity    Physical activity: exercise: walking on treadmill 1-2 days per week            Wt Readings from Last 3 Encounters:  07/20/22 175 lb (79.4 kg)  03/16/22 183 lb (83 kg)  11/25/21 178 lb 12.8 oz (81.1 kg)    LABS:  Lab Results  Component Value Date    HGBA1C 6.6 (H) 07/18/2022   HGBA1C 6.9 (H) 03/09/2022   HGBA1C 7.1 (H) 11/18/2021   Lab Results  Component Value Date   MICROALBUR <0.7 07/18/2022   LDLCALC 97 07/18/2022   CREATININE 0.85 07/18/2022   Other active problems discussed today: See review of systems   Lab on 07/18/2022  Component Date Value Ref Range Status   Microalb, Ur 07/18/2022 <0.7  0.0 - 1.9 mg/dL Final   Creatinine,U 16/11/9602 54.2  mg/dL Final   Microalb Creat Ratio 07/18/2022 1.3  0.0 - 30.0 mg/g Final   Cholesterol 07/18/2022 189  0 - 200 mg/dL Final   ATP III Classification       Desirable:  < 200 mg/dL               Borderline High:  200 - 239 mg/dL          High:  > = 540 mg/dL   Triglycerides 98/12/9145 78.0  0.0 - 149.0 mg/dL Final   Normal:  <829 mg/dLBorderline High:  150 - 199 mg/dL   HDL 56/21/3086 57.84  >39.00 mg/dL Final   VLDL 69/62/9528 15.6  0.0 - 40.0 mg/dL Final   LDL Cholesterol 07/18/2022 97  0 - 99 mg/dL Final   Total CHOL/HDL Ratio 07/18/2022 2   Final  Men          Women1/2 Average Risk     3.4          3.3Average Risk          5.0          4.42X Average Risk          9.6          7.13X Average Risk          15.0          11.0                       NonHDL 07/18/2022 112.83   Final   NOTE:  Non-HDL goal should be 30 mg/dL higher than patient's LDL goal (i.e. LDL goal of < 70 mg/dL, would have non-HDL goal of < 100 mg/dL)   Sodium 16/11/9602 540  135 - 145 mEq/L Final   Potassium 07/18/2022 4.5  3.5 - 5.1 mEq/L Final   Chloride 07/18/2022 105  96 - 112 mEq/L Final   CO2 07/18/2022 27  19 - 32 mEq/L Final   Glucose, Bld 07/18/2022 100 (H)  70 - 99 mg/dL Final   BUN 98/12/9145 23  6 - 23 mg/dL Final   Creatinine, Ser 07/18/2022 0.85  0.40 - 1.20 mg/dL Final   Total Bilirubin 07/18/2022 0.7  0.2 - 1.2 mg/dL Final   Alkaline Phosphatase 07/18/2022 54  39 - 117 U/L Final   AST 07/18/2022 20  0 - 37 U/L Final   ALT 07/18/2022 19  0 - 35 U/L Final   Total Protein 07/18/2022  7.0  6.0 - 8.3 g/dL Final   Albumin 82/95/6213 4.3  3.5 - 5.2 g/dL Final   GFR 08/65/7846 66.24  >60.00 mL/min Final   Calculated using the CKD-EPI Creatinine Equation (2021)   Calcium 07/18/2022 10.4  8.4 - 10.5 mg/dL Final   Hgb N6E MFr Bld 07/18/2022 6.6 (H)  4.6 - 6.5 % Final   Glycemic Control Guidelines for People with Diabetes:Non Diabetic:  <6%Goal of Therapy: <7%Additional Action Suggested:  >8%     Allergies as of 07/20/2022       Reactions   Statins Other (See Comments)   Muscle pain. Pt able to take Crestor but not everyday.   Ezetimibe    Other Reaction(s): intolerant   Jardiance [empagliflozin] Nausea Only   Lisinopril Cough   Losartan Potassium Diarrhea   Metformin    Other Reaction(s): nausea/diarrhea   Pioglitazone Swelling   Meperidine Hcl Rash        Medication List        Accurate as of July 20, 2022  9:16 AM. If you have any questions, ask your nurse or doctor.          STOP taking these medications    cephALEXin 500 MG capsule Commonly known as: KEFLEX       TAKE these medications    albuterol 108 (90 Base) MCG/ACT inhaler Commonly known as: VENTOLIN HFA 2 puff as needed for wheezing Inhalation every 4-6 hrs   amoxicillin 500 MG capsule Commonly known as: AMOXIL Take 500 mg by mouth every 8 (eight) hours.   cloNIDine 0.1 mg/24hr patch Commonly known as: CATAPRES - Dosed in mg/24 hr APPLY 1 PATCH TOPICALLY TO THE SKIN EVERY WEEK   Ergocalciferol 50 MCG (2000 UT) Caps 3 capsules Orally once a day   ezetimibe 10 MG tablet Commonly known as: ZETIA TAKE 1  TABLET(10 MG) BY MOUTH DAILY   Farxiga 10 MG Tabs tablet Generic drug: dapagliflozin propanediol Take 1 tablet (10 mg total) by mouth daily.   Flarex 0.1 % ophthalmic suspension Generic drug: fluorometholone   gabapentin 100 MG capsule Commonly known as: NEURONTIN Take 1 capsule (100 mg total) by mouth 2 (two) times daily.   glucose blood test strip Use Onetouch Ultra test  strips as instructed to check blood sugar once daily.   ondansetron 4 MG tablet Commonly known as: Zofran Take 1 tablet (4 mg total) by mouth every 8 (eight) hours as needed.   ONE TOUCH ULTRA 2 w/Device Kit 1 each by Does not apply route daily. Use daily to check blood sugar.   Pitavastatin Calcium 4 MG Tabs TAKE 1 TABLET(4 MG) BY MOUTH DAILY   Trulicity 0.75 MG/0.5ML Sopn Generic drug: Dulaglutide Inject 0.75 mg into the skin once a week.   Vitamin D3 50 MCG (2000 UT) Tabs Take 6,000 Units by mouth daily.        Allergies:  Allergies  Allergen Reactions   Statins Other (See Comments)    Muscle pain. Pt able to take Crestor but not everyday.   Ezetimibe     Other Reaction(s): intolerant   Jardiance [Empagliflozin] Nausea Only   Lisinopril Cough   Losartan Potassium Diarrhea   Metformin     Other Reaction(s): nausea/diarrhea   Pioglitazone Swelling   Meperidine Hcl Rash    Past Medical History:  Diagnosis Date   Arthritis    Breast cancer ([facility])    Breast cancer of upper-outer quadrant of right female breast ([facility]) 03/24/2015   Carpal tunnel syndrome    Diabetes mellitus without complication ([facility])    type 2   GERD (gastroesophageal reflux disease)    Pt takes OTC when needed.   Hot flashes     Past Surgical History:  Procedure Laterality Date   ABDOMINAL HYSTERECTOMY     APPENDECTOMY     COLONOSCOPY     ESOPHAGOGASTRODUODENOSCOPY     EYE SURGERY Bilateral    Cataracts removed   JOINT REPLACEMENT     RADIOACTIVE SEED GUIDED PARTIAL MASTECTOMY WITH AXILLARY SENTINEL LYMPH NODE BIOPSY Right 04/16/2015   Procedure: RADIOACTIVE SEED GUIDED PARTIAL MASTECTOMY WITH AXILLARY SENTINEL LYMPH NODE BIOPSY;  Surgeon: Ovidio Kin, MD;  Location: [facility];  Service: General;  Laterality: Right;   SHOULDER SURGERY Right    TONSILLECTOMY     TOTAL KNEE ARTHROPLASTY Right 07/18/2013   DR GRAVES   TOTAL KNEE ARTHROPLASTY Right 07/18/2013   Procedure:  TOTAL KNEE ARTHROPLASTY;  Surgeon: Harvie Junior, MD;  Location: [facility];  Service: Orthopedics;  Laterality: Right;   TOTAL KNEE ARTHROPLASTY Left 06/15/2017   Procedure: LEFT TOTAL KNEE ARTHROPLASTY;  Surgeon: Jodi Geralds, MD;  Location: [facility];  Service: Orthopedics;  Laterality: Left;    No family history on file.  Social History:  reports that she has never smoked. She has never used smokeless tobacco. She reports current alcohol use. She reports that she does not use drugs.  Review of Systems:  Lipids:  She was switched from Crestor to Livalo because of her difficulty tolerating this because of joint pains . She is taking Livalo 4 mg and has been able to take this daily without joint and muscle pains Also has been taking EZETIMIBE since previously her LDL was 167   Previously baseline LDL has been as high as 200 LDL appears to be to be higher Although  her refill history does not indicate she is getting Zetia she thinks she just got a refill not long ago and has been taking it    Lab Results  Component Value Date   CHOL 189 07/18/2022   CHOL 149 11/18/2021   CHOL 183 04/07/2021   Lab Results  Component Value Date   HDL 76.40 07/18/2022   HDL 63.90 11/18/2021   HDL 80.80 04/07/2021   Lab Results  Component Value Date   LDLCALC 97 07/18/2022   LDLCALC 72 11/18/2021   LDLCALC 88 04/07/2021   Lab Results  Component Value Date   TRIG 78.0 07/18/2022   TRIG 63.0 11/18/2021   TRIG 70.0 04/07/2021   Lab Results  Component Value Date   CHOLHDL 2 07/18/2022   CHOLHDL 2 11/18/2021   CHOLHDL 2 04/07/2021   Lab Results  Component Value Date   LDLDIRECT 120.1 09/30/2013   LDLDIRECT 115.5 03/24/2013   LDLDIRECT 204.9 11/19/2012     She is using the Catapres patch prescribed by her oncologist mostly for sweating episodes and not for hypertension Blood pressure appears to be variable  BP Readings from Last 3 Encounters:  07/20/22 (!) 141/78  04/27/22 (!) 116/59   03/16/22 136/68    Foot exam done in 2023, normal exam  Eye exam: Usually regular She is seen by [facility] center  She takes vitamin D 6000 units daily as advised by her PCP  Bone density was normal in 2016 done by PCP,  HYPERCALCEMIA: She has had variably high normal calcium levels since 6/19 Not on any HCTZ PTH not suppressed Calcium is as follows  Lab Results  Component Value Date   CALCIUM 10.4 07/18/2022   CALCIUM 10.6 (H) 03/09/2022   CALCIUM 10.2 11/18/2021   CALCIUM 10.5 07/15/2021   CALCIUM 10.3 04/07/2021   CALCIUM 11.3 (H) 01/06/2021   CALCIUM 10.2 07/22/2020   Lab Results  Component Value Date   PTH 29 10/11/2018   CALCIUM 10.4 07/18/2022   CAION 1.09 (L) 01/10/2009      Examination:     BP (!) 141/78 (BP Location: Left Arm, Patient Position: Sitting, Cuff Size: Large)   Pulse 63   Ht 5\' 2"  (1.575 m)   Wt 175 lb (79.4 kg)   SpO2 98%   BMI 32.01 kg/m   Body mass index is 32.01 kg/m.    ASSESSMENT/ PLAN:  Diabetes type 2 with mild obesity  See history of present illness for discussion of current diabetes management, blood sugar patterns and problems identified  Her A1c has improved slightly at 6.6  She is on Trulicity 0.75 mg and Farxiga 10 mg daily  Overall control is better and she has lost weight This is despite not being able to exercise Most of her blood sugars are fairly good although not checking after dinner She can continue the same regimen, currently no side effects or adverse effects from renal function with above medications  HYPERCHOLESTEROLEMIA:  Although her lipids still show adequate control her LDL is relatively much higher than last year She states that she is taking both her medications although cannot verify this on the refill history for Zetia Diet has been fairly good Will continue Livalo and Zetia unchanged  Follow-up in 4 months  There are no Patient Instructions on file for this visit.   Reather Littler 07/20/2022, 9:16 AM

## 2022-08-03 ENCOUNTER — Other Ambulatory Visit: Payer: Self-pay

## 2022-08-03 ENCOUNTER — Other Ambulatory Visit: Payer: Self-pay | Admitting: Endocrinology

## 2022-08-03 MED ORDER — FARXIGA 10 MG PO TABS: 10.0000 mg | ORAL_TABLET | Freq: Every day | ORAL | 5 refills | Status: DC

## 2022-08-29 ENCOUNTER — Ambulatory Visit (INDEPENDENT_AMBULATORY_CARE_PROVIDER_SITE_OTHER): Payer: Medicare Other | Admitting: Podiatry

## 2022-08-29 DIAGNOSIS — M19071 Primary osteoarthritis, right ankle and foot: Secondary | ICD-10-CM

## 2022-08-29 NOTE — Progress Notes (Signed)
Amanda [name] presents today for follow-up of her MRI.  She states that there is really no change and I cannot wear any shoes the pain is 10 out of 10 as far as she is concerned with this right hallux.  She states that the pain used to be intermittent now it is consistent.  Objective: Vital signs are stable she is alert and oriented x 3 there is no erythema edema cellulitis drainage or odor she now has pain on not only palpation of the distal aspect of the toe but on range of motion and end range of motion of the first metatarsophalangeal joint right foot.  MRI discussed only osteoarthritis of the first metatarsal phalangeal joint and tibial sesamoid.  Osteoarthritis first metatarsophalangeal joint right foot.  This was resulting in neuritis.  Plan: I did discuss with her today injecting around the joint to see if this would alleviate any of her symptoms.  I did express to her that we could do an intra-articular injection as well but I think around the joint would probably help with the neuritis as well.  I did express to her that we may need to start dehydrated alcohol injections to help decrease the sensitivity of that toe.  She understands that and is amenable to it if necessary.

## 2022-10-23 ENCOUNTER — Other Ambulatory Visit: Payer: Self-pay

## 2022-10-23 DIAGNOSIS — E7800 Pure hypercholesterolemia, unspecified: Secondary | ICD-10-CM

## 2022-10-23 MED ORDER — PITAVASTATIN CALCIUM 4 MG PO TABS: ORAL_TABLET | ORAL | 1 refills | Status: DC

## 2022-10-24 ENCOUNTER — Other Ambulatory Visit: Payer: Self-pay | Admitting: Endocrinology

## 2022-10-24 DIAGNOSIS — E7800 Pure hypercholesterolemia, unspecified: Secondary | ICD-10-CM

## 2022-10-30 ENCOUNTER — Encounter: Payer: Self-pay | Admitting: Podiatry

## 2022-11-28 ENCOUNTER — Other Ambulatory Visit: Payer: Self-pay

## 2022-11-28 DIAGNOSIS — E1165 Type 2 diabetes mellitus with hyperglycemia: Secondary | ICD-10-CM

## 2022-11-28 MED ORDER — GLUCOSE BLOOD VI STRP: ORAL_STRIP | 2 refills | Status: AC

## 2022-12-05 ENCOUNTER — Ambulatory Visit (INDEPENDENT_AMBULATORY_CARE_PROVIDER_SITE_OTHER): Payer: Medicare Other | Admitting: Endocrinology

## 2022-12-05 ENCOUNTER — Encounter: Payer: Self-pay | Admitting: Endocrinology

## 2022-12-05 ENCOUNTER — Other Ambulatory Visit: Payer: Self-pay

## 2022-12-05 VITALS — BP 130/70 | HR 73 | Resp 20 | Ht 62.0 in | Wt 180.4 lb

## 2022-12-05 DIAGNOSIS — Z7985 Long-term (current) use of injectable non-insulin antidiabetic drugs: Secondary | ICD-10-CM | POA: Diagnosis not present

## 2022-12-05 DIAGNOSIS — Z7984 Long term (current) use of oral hypoglycemic drugs: Secondary | ICD-10-CM

## 2022-12-05 DIAGNOSIS — E119 Type 2 diabetes mellitus without complications: Secondary | ICD-10-CM

## 2022-12-05 LAB — POCT GLYCOSYLATED HEMOGLOBIN (HGB A1C): Hemoglobin A1C: 5.9 % — AB (ref 4.0–5.6)

## 2022-12-05 MED ORDER — EZETIMIBE 10 MG PO TABS: ORAL_TABLET | ORAL | 1 refills | Status: DC

## 2022-12-05 NOTE — Progress Notes (Signed)
Outpatient Endocrinology Note [place] Jshaun Abernathy, MD  12/05/22  Patient's Name: Amanda [name]    DOB: 25-May-1945    MRN: [id number]                                                    REASON OF VISIT: Follow up of type 2 diabetes mellitus  PCP: Tally Joe, MD  HISTORY OF PRESENT ILLNESS:   Amanda [name] is a 77 y.o. old female with past medical history listed below, is here for follow up for type 2 diabetes mellitus.   Pertinent Diabetes History: Patient was diagnosed with type 2 diabetes mellitus around 2007.  She has uncontrolled type 2 diabetes mellitus.  Chronic Diabetes Complications : Retinopathy: no. Last ophthalmology exam was done on annually, following with ophthalmology regularly.  Nephropathy: no, on ACE/ARB / losartan Peripheral neuropathy: no Coronary artery disease: no Stroke: no  Relevant comorbidities and cardiovascular risk factors: Obesity: yes Body mass index is 33 kg/m.  Hypertension: Yes  Hyperlipidemia : Yes, on statin   Current / Home Diabetic regimen includes: Trulicity 0.75 mg weekly. Farxiga 10mg  daily.   Prior diabetic medications: Glipizide.  Invokana.  Victoza.  She had nausea and GI upset with higher dose of Trulicity in the past.  Metformin, had intolerance /diarrhea..  Glycemic data:   One Touch ultra 2.  Glucometer data downloaded October 15 to October 29, average 114, lowest 101, highest 136.  She has been checking few times a week.  Some of the blood sugar 103, 112, 101, 136, 116.  Hypoglycemia: Patient has no hypoglycemic episodes. Patient has hypoglycemia awareness.  Factors modifying glucose control: 1.  Diabetic diet assessment: 3 meals a day.  2.  Staying active or exercising: Walking on treadmill 1-2 times per week.  3.  Medication compliance: compliant all of the time.  HYPERCALCEMIA: She has had variably high normal calcium levels since 6/19. Not on any hydrochlorothiazide.  PTH not suppressed  Interval history  Glucometer  data and diabetes regimen as noted above.  No GI intolerance with Trulicity tolerating well.  Hemoglobin A1c 5.9% today.  No other complaints.  Denies numbness and tingling of the feet.  No vision problem.  REVIEW OF SYSTEMS As per history of present illness.   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   Hot flashes     PAST SURGICAL HISTORY: Past Surgical History:  Procedure Laterality Date   ABDOMINAL HYSTERECTOMY     APPENDECTOMY     COLONOSCOPY     ESOPHAGOGASTRODUODENOSCOPY     EYE SURGERY Bilateral    Cataracts removed   JOINT REPLACEMENT     RADIOACTIVE SEED GUIDED PARTIAL MASTECTOMY WITH AXILLARY SENTINEL LYMPH NODE BIOPSY Right 04/16/2015   Procedure: RADIOACTIVE SEED GUIDED PARTIAL MASTECTOMY WITH AXILLARY SENTINEL LYMPH NODE BIOPSY;  Surgeon: Ovidio Kin, MD;  Location: [facility];  Service: General;  Laterality: Right;   SHOULDER SURGERY Right    TONSILLECTOMY     TOTAL KNEE ARTHROPLASTY Right 07/18/2013  DR GRAVES   TOTAL KNEE ARTHROPLASTY Right 07/18/2013   Procedure: TOTAL KNEE ARTHROPLASTY;  Surgeon: Harvie Junior, MD;  Location: [facility];  Service: Orthopedics;  Laterality: Right;   TOTAL KNEE ARTHROPLASTY Left 06/15/2017   Procedure: LEFT TOTAL KNEE ARTHROPLASTY;  Surgeon: Jodi Geralds, MD;  Location: [facility];  Service: Orthopedics;  Laterality: Left;    ALLERGIES: Allergies  Allergen Reactions   Statins Other (See Comments)    Muscle pain. Pt able to take Crestor but not everyday.   Ezetimibe     Other Reaction(s): intolerant   Jardiance [Empagliflozin] Nausea Only   Lisinopril Cough   Losartan Potassium Diarrhea   Metformin     Other Reaction(s):  nausea/diarrhea   Pioglitazone Swelling   Meperidine Hcl Rash    FAMILY HISTORY:  History reviewed. No pertinent family history.  SOCIAL HISTORY: Social History   Socioeconomic History   Marital status: Divorced    Spouse name: Not on file   Number of children: Not on file   Years of education: Not on file   Highest education level: Not on file  Occupational History   Not on file  Tobacco Use   Smoking status: Never   Smokeless tobacco: Never  Vaping Use   Vaping status: Never Used  Substance and Sexual Activity   Alcohol use: Yes    Comment: socially, occ weekends   Drug use: No   Sexual activity: Not on file  Other Topics Concern   Not on file  Social History Narrative   Not on file   Social Determinants of Health   Financial Resource Strain: Not on file  Food Insecurity: Not on file  Transportation Needs: Not on file  Physical Activity: Not on file  Stress: Not on file  Social Connections: Not on file    MEDICATIONS:  Current Outpatient Medications  Medication Sig Dispense Refill   albuterol (VENTOLIN HFA) 108 (90 Base) MCG/ACT inhaler 2 puff as needed for wheezing Inhalation every 4-6 hrs     amoxicillin (AMOXIL) 500 MG capsule Take 500 mg by mouth every 8 (eight) hours.     Blood Glucose Monitoring Suppl (ONE TOUCH ULTRA 2) w/Device KIT 1 each by Does not apply route daily. Use daily to check blood sugar. 1 each 0   Cholecalciferol (VITAMIN D3) 2000 units TABS Take 6,000 Units by mouth daily.      cloNIDine (CATAPRES - DOSED IN MG/24 HR) 0.1 mg/24hr patch APPLY 1 PATCH TOPICALLY TO THE SKIN EVERY WEEK 4 patch 5   Dulaglutide (TRULICITY) 0.75 MG/0.5ML SOPN Inject 0.75 mg into the skin once a week. 6 mL 3   Ergocalciferol 50 MCG (2000 UT) CAPS 3 capsules Orally once a day     ezetimibe (ZETIA) 10 MG tablet TAKE 1 TABLET(10 MG) BY MOUTH DAILY 90 tablet 1   FARXIGA 10 MG TABS tablet Take 1 tablet (10 mg total) by mouth daily. 30 tablet 5   FLAREX 0.1 %  ophthalmic suspension      gabapentin (NEURONTIN) 100 MG capsule Take 1 capsule (100 mg total) by mouth 2 (two) times daily. 60 capsule 3   glucose blood test strip Use Onetouch Ultra test strips as instructed to check blood sugar once daily. 100 each 2   ondansetron (ZOFRAN) 4 MG tablet Take 1 tablet (4 mg total) by mouth every 8 (eight) hours as needed. 20 tablet 0   Pitavastatin Calcium 4 MG TABS TAKE 1 TABLET(4 MG) BY MOUTH DAILY 90 tablet 4  No current facility-administered medications for this visit.    PHYSICAL EXAM: Vitals:   12/05/22 0938  BP: 130/70  Pulse: 73  Resp: 20  SpO2: 99%  Weight: 180 lb 6.4 oz (81.8 kg)  Height: 5\' 2"  (1.575 m)   Body mass index is 33 kg/m.  Wt Readings from Last 3 Encounters:  12/05/22 180 lb 6.4 oz (81.8 kg)  07/20/22 175 lb (79.4 kg)  03/16/22 183 lb (83 kg)    General: Well developed, well nourished female in no apparent distress.  HEENT: AT/[place], no external lesions.  Eyes: Conjunctiva clear and no icterus. Neck: Neck supple  Lungs: Respirations not labored Neurologic: Alert, oriented, normal speech Extremities / Skin: Dry. No sores or rashes noted.  Psychiatric: Does not appear depressed or anxious  Diabetic Foot Exam - Simple   No data filed     LABS Reviewed Lab Results  Component Value Date   HGBA1C 5.9 (A) 12/05/2022   HGBA1C 6.6 (H) 07/18/2022   HGBA1C 6.9 (H) 03/09/2022   Lab Results  Component Value Date   FRUCTOSAMINE 251 06/14/2015   Lab Results  Component Value Date   CHOL 189 07/18/2022   HDL 76.40 07/18/2022   LDLCALC 97 07/18/2022   LDLDIRECT 120.1 09/30/2013   TRIG 78.0 07/18/2022   CHOLHDL 2 07/18/2022   Lab Results  Component Value Date   MICRALBCREAT 1.3 07/18/2022   MICRALBCREAT 1.1 07/15/2021   Lab Results  Component Value Date   CREATININE 0.85 07/18/2022   Lab Results  Component Value Date   GFR 66.24 07/18/2022    ASSESSMENT / PLAN  1. Controlled type 2 diabetes mellitus without  complication, without long-term current use of insulin (HCC)     Diabetes Mellitus type 2, complicated by no known complications. - Diabetic status / severity: Controlled.  Lab Results  Component Value Date   HGBA1C 5.9 (A) 12/05/2022    - Hemoglobin A1c goal : <7%  - Medications: No change.  I) continue Trulicity 0.75 mg weekly. II) continue Farxiga 10 mg daily.  - Home glucose testing: Few times a week at different times. - Discussed/ Gave Hypoglycemia treatment plan.  # Consult : not required at this time.   # Annual urine for microalbuminuria/ creatinine ratio, no microalbuminuria currently. Last  Lab Results  Component Value Date   MICRALBCREAT 1.3 07/18/2022    # Foot check nightly.  # Annual dilated diabetic eye exams.   - Diet: Make healthy diabetic food choices - Life style / activity / exercise: Discussed.  2. Blood pressure  -  BP Readings from Last 1 Encounters:  12/05/22 130/70    - Control is in target.  - No change in current plans.  3. Lipid status / Hyperlipidemia - Last  Lab Results  Component Value Date   LDLCALC 97 07/18/2022   - Continue pitavastatin 4 mg daily.  She had switched from Crestor to Livalo because of difficulty tolerating this because of joint pain. ?  Zetia.  Diagnoses and all orders for this visit:  Controlled type 2 diabetes mellitus without complication, without long-term current use of insulin (HCC) -     POCT glycosylated hemoglobin (Hb A1C)    DISPOSITION Follow up in clinic in 4  months suggested.   All questions answered and patient verbalized understanding of the plan.  [place] Leeman Johnsey, MD [facility] Endocrinology [facility] [address] [place], [address] [place], [place] [address] Phone # [phone]  At least part of this  note was generated using voice recognition software. Inadvertent word errors may have occurred, which were not recognized during the proofreading process.

## 2023-01-08 ENCOUNTER — Other Ambulatory Visit: Payer: Self-pay

## 2023-01-08 DIAGNOSIS — C50411 Malignant neoplasm of upper-outer quadrant of right female breast: Secondary | ICD-10-CM

## 2023-01-08 MED ORDER — CLONIDINE 0.1 MG/24HR TD PTWK: MEDICATED_PATCH | TRANSDERMAL | 0 refills | Status: DC

## 2023-02-08 ENCOUNTER — Other Ambulatory Visit: Payer: Self-pay | Admitting: Endocrinology

## 2023-02-08 DIAGNOSIS — C50411 Malignant neoplasm of upper-outer quadrant of right female breast: Secondary | ICD-10-CM

## 2023-02-08 NOTE — Telephone Encounter (Signed)
 clonidine refill request complete

## 2023-02-23 ENCOUNTER — Telehealth: Payer: Self-pay | Admitting: Endocrinology

## 2023-02-23 NOTE — Telephone Encounter (Addendum)
Patient came in to office and brought a [employer] Prescription Template to be completed.  The form is in Dr. Wadie Lessen folder in the front office.

## 2023-03-05 NOTE — Telephone Encounter (Signed)
Patient came back and filled out [employer] Form and placed in providers folder at the front desk.

## 2023-03-08 ENCOUNTER — Encounter: Payer: Self-pay | Admitting: Endocrinology

## 2023-03-08 ENCOUNTER — Ambulatory Visit: Payer: Medicare Other | Admitting: Endocrinology

## 2023-03-08 VITALS — BP 150/70 | HR 79 | Resp 20 | Ht 62.0 in | Wt 174.4 lb

## 2023-03-08 DIAGNOSIS — E1165 Type 2 diabetes mellitus with hyperglycemia: Secondary | ICD-10-CM

## 2023-03-08 DIAGNOSIS — E785 Hyperlipidemia, unspecified: Secondary | ICD-10-CM

## 2023-03-08 DIAGNOSIS — E119 Type 2 diabetes mellitus without complications: Secondary | ICD-10-CM | POA: Diagnosis not present

## 2023-03-08 DIAGNOSIS — Z7984 Long term (current) use of oral hypoglycemic drugs: Secondary | ICD-10-CM

## 2023-03-08 DIAGNOSIS — Z7985 Long-term (current) use of injectable non-insulin antidiabetic drugs: Secondary | ICD-10-CM | POA: Diagnosis not present

## 2023-03-08 LAB — POCT GLYCOSYLATED HEMOGLOBIN (HGB A1C): Hemoglobin A1C: 6.4 % — AB (ref 4.0–5.6)

## 2023-03-08 MED ORDER — FARXIGA 10 MG PO TABS: 10.0000 mg | ORAL_TABLET | Freq: Every day | ORAL | 3 refills | Status: DC

## 2023-03-08 MED ORDER — TRULICITY 0.75 MG/0.5ML ~~LOC~~ SOAJ: 0.7500 mg | SUBCUTANEOUS | 3 refills | Status: DC

## 2023-03-08 NOTE — Progress Notes (Signed)
Outpatient Endocrinology Note [place] Mayah Urquidi, MD  03/08/23  Patient's Name: Amanda [name]    DOB: 07-25-1945    MRN: [id number]                                                    REASON OF VISIT: Follow up of type 2 diabetes mellitus  PCP: Tally Joe, MD  HISTORY OF PRESENT ILLNESS:   Amanda [name] is a 78 y.o. old female with past medical history listed below, is here for follow up for type 2 diabetes mellitus.   Pertinent Diabetes History: Patient was diagnosed with type 2 diabetes mellitus around 2007.  She has controlled type 2 diabetes mellitus.  Chronic Diabetes Complications : Retinopathy: no. Last ophthalmology exam was done on annually, following with ophthalmology regularly.  Nephropathy: no, on ACE/ARB / losartan Peripheral neuropathy: no Coronary artery disease: no Stroke: no  Relevant comorbidities and cardiovascular risk factors: Obesity: yes Body mass index is 31.9 kg/m.  Hypertension: Yes  Hyperlipidemia : Yes, on statin   Current / Home Diabetic regimen includes: Trulicity 0.75 mg weekly. Farxiga 10mg  daily.   Prior diabetic medications: Glipizide.  Invokana.  Victoza.  She had nausea and GI upset with higher dose of Trulicity in the past.  Metformin, had intolerance /diarrhea..  Glycemic data:   One Touch ultra 2.  Glucometer data downloaded  jan 16 - Mar 08, 2023. Checked rarely glucose 127.  Hypoglycemia: Patient has no hypoglycemic episodes. Patient has hypoglycemia awareness.  Factors modifying glucose control: 1.  Diabetic diet assessment: 3 meals a day.  2.  Staying active or exercising: Walking on treadmill 1-2 times per week.  3.  Medication compliance: compliant all of the time.  HYPERCALCEMIA: She has had variably high normal calcium levels since 6/19. Not on any hydrochlorothiazide.  PTH not suppressed  Interval history  Hemoglobin A1c 6.4% today.  Diabetes regimen reviewed and noted as above.  She denies complaints of numbness and  ting of the feet.  No vision problem.  No other complaints today.  REVIEW OF SYSTEMS As per history of present illness.   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   Hot flashes     PAST SURGICAL HISTORY: Past Surgical History:  Procedure Laterality Date   ABDOMINAL HYSTERECTOMY     APPENDECTOMY     COLONOSCOPY     ESOPHAGOGASTRODUODENOSCOPY     EYE SURGERY Bilateral    Cataracts removed   JOINT REPLACEMENT     RADIOACTIVE SEED GUIDED PARTIAL MASTECTOMY WITH AXILLARY SENTINEL LYMPH NODE BIOPSY Right 04/16/2015   Procedure: RADIOACTIVE SEED GUIDED PARTIAL MASTECTOMY WITH AXILLARY SENTINEL LYMPH NODE BIOPSY;  Surgeon: Ovidio Kin, MD;  Location: [facility];  Service: General;  Laterality: Right;   SHOULDER SURGERY Right    TONSILLECTOMY     TOTAL KNEE ARTHROPLASTY Right 07/18/2013   DR GRAVES   TOTAL KNEE ARTHROPLASTY Right 07/18/2013   Procedure: TOTAL KNEE ARTHROPLASTY;  Surgeon: Harvie Junior, MD;  Location: [facility];  Service: Orthopedics;  Laterality: Right;   TOTAL KNEE ARTHROPLASTY Left 06/15/2017   Procedure: LEFT TOTAL KNEE ARTHROPLASTY;  Surgeon: Jodi Geralds, MD;  Location: [facility];  Service: Orthopedics;  Laterality: Left;    ALLERGIES: Allergies  Allergen Reactions   Statins Other (See Comments)    Muscle pain. Pt able to take Crestor but not everyday.   Ezetimibe     Other Reaction(s): intolerant   Jardiance [Empagliflozin] Nausea Only   Lisinopril Cough   Losartan Potassium Diarrhea   Metformin     Other Reaction(s): nausea/diarrhea   Pioglitazone Swelling   Meperidine Hcl Rash    FAMILY HISTORY:  History reviewed. No pertinent family history.  SOCIAL  HISTORY: Social History   Socioeconomic History   Marital status: Divorced    Spouse name: Not on file   Number of children: Not on file   Years of education: Not on file   Highest education level: Not on file  Occupational History   Not on file  Tobacco Use   Smoking status: Never   Smokeless tobacco: Never  Vaping Use   Vaping status: Never Used  Substance and Sexual Activity   Alcohol use: Yes    Comment: socially, occ weekends   Drug use: No   Sexual activity: Not on file  Other Topics Concern   Not on file  Social History Narrative   Not on file   Social Drivers of Health   Financial Resource Strain: Not on file  Food Insecurity: Not on file  Transportation Needs: Not on file  Physical Activity: Not on file  Stress: Not on file  Social Connections: Not on file    MEDICATIONS:  Current Outpatient Medications  Medication Sig Dispense Refill   albuterol (VENTOLIN HFA) 108 (90 Base) MCG/ACT inhaler 2 puff as needed for wheezing Inhalation every 4-6 hrs     amoxicillin (AMOXIL) 500 MG capsule Take 500 mg by mouth every 8 (eight) hours.     Blood Glucose Monitoring Suppl (ONE TOUCH ULTRA 2) w/Device KIT 1 each by Does not apply route daily. Use daily to check blood sugar. 1 each 0   Cholecalciferol (VITAMIN D3) 2000 units TABS Take 6,000 Units by mouth daily.      cloNIDine (CATAPRES - DOSED IN MG/24 HR) 0.1 mg/24hr patch APPLY 1 PATCH TOPICALLY TO THE SKIN EVERY WEEK 4 patch 0   Ergocalciferol 50 MCG (2000 UT) CAPS 3 capsules Orally once a day     ezetimibe (ZETIA) 10 MG tablet Take 1 tablet (10mg ) by mouth daily 90 tablet 1   FLAREX 0.1 % ophthalmic suspension      gabapentin (NEURONTIN) 100 MG capsule Take 1 capsule (100 mg total) by mouth 2 (two) times daily. 60 capsule 3   glucose blood test strip Use Onetouch Ultra test strips as instructed to check blood sugar once daily. 100 each 2   ondansetron (ZOFRAN) 4 MG tablet Take 1 tablet (4 mg total) by mouth every 8  (eight) hours as needed. 20 tablet 0   Pitavastatin Calcium 4 MG TABS TAKE 1 TABLET(4 MG) BY MOUTH DAILY 90 tablet 4   Dulaglutide (TRULICITY) 0.75 MG/0.5ML SOAJ Inject 0.75 mg into the skin once a week. 6 mL 3   FARXIGA 10 MG TABS tablet Take 1 tablet (10 mg total) by mouth daily. 90 tablet 3   No current facility-administered medications for this visit.    PHYSICAL EXAM: Vitals:   03/08/23 0930 03/08/23 1007  BP: Marland Kitchen)  148/72 (!) 150/70  Pulse: 79   Resp: 20   SpO2: 98%   Weight: 174 lb 6.4 oz (79.1 kg)   Height: 5\' 2"  (1.575 m)     Body mass index is 31.9 kg/m.  Wt Readings from Last 3 Encounters:  03/08/23 174 lb 6.4 oz (79.1 kg)  12/05/22 180 lb 6.4 oz (81.8 kg)  07/20/22 175 lb (79.4 kg)    General: Well developed, well nourished female in no apparent distress.  HEENT: AT/[place], no external lesions.  Eyes: Conjunctiva clear and no icterus. Neck: Neck supple  Lungs: Respirations not labored Neurologic: Alert, oriented, normal speech Extremities / Skin: Dry.   Psychiatric: Does not appear depressed or anxious  Diabetic Foot Exam - Simple   Simple Foot Form Diabetic Foot exam was performed with the following findings: Yes 03/08/2023  9:38 AM  Visual Inspection No deformities, no ulcerations, no other skin breakdown bilaterally: Yes Sensation Testing Intact to touch and monofilament testing bilaterally: Yes Pulse Check Posterior Tibialis and Dorsalis pulse intact bilaterally: Yes Comments     LABS Reviewed Lab Results  Component Value Date   HGBA1C 6.4 (A) 03/08/2023   HGBA1C 5.9 (A) 12/05/2022   HGBA1C 6.6 (H) 07/18/2022   Lab Results  Component Value Date   FRUCTOSAMINE 251 06/14/2015   Lab Results  Component Value Date   CHOL 189 07/18/2022   HDL 76.40 07/18/2022   LDLCALC 97 07/18/2022   LDLDIRECT 120.1 09/30/2013   TRIG 78.0 07/18/2022   CHOLHDL 2 07/18/2022   Lab Results  Component Value Date   MICRALBCREAT 1.3 07/18/2022   MICRALBCREAT 1.1  07/15/2021   Lab Results  Component Value Date   CREATININE 0.85 07/18/2022   Lab Results  Component Value Date   GFR 66.24 07/18/2022    ASSESSMENT / PLAN  1. Controlled type 2 diabetes mellitus without complication, without long-term current use of insulin (HCC)   2. Type 2 diabetes mellitus with hyperglycemia, without long-term current use of insulin (HCC)      Diabetes Mellitus type 2, complicated by no known complications. - Diabetic status / severity: Controlled.  Lab Results  Component Value Date   HGBA1C 6.4 (A) 03/08/2023    - Hemoglobin A1c goal : <7%  - Medications: No change.  She had GI intolerance with higher dose of Trulicity in the past.  I) continue Trulicity 0.75 mg weekly. II) continue Farxiga 10 mg daily.  - Home glucose testing: Few times a week at different times. - Discussed/ Gave Hypoglycemia treatment plan.  # Consult : not required at this time.   # Annual urine for microalbuminuria/ creatinine ratio, no microalbuminuria currently. Last  Lab Results  Component Value Date   MICRALBCREAT 1.3 07/18/2022    # Foot check nightly.  # Annual dilated diabetic eye exams.   - Diet: Make healthy diabetic food choices - Life style / activity / exercise: Discussed.  2. Blood pressure  -  BP Readings from Last 1 Encounters:  03/08/23 (!) 150/70    - Control is not in target. Mildly high today.  Asymptomatic.  Monitor at home and if he still high asked to talk with primary care provider. - No change in current plans.  3. Lipid status / Hyperlipidemia - Last  Lab Results  Component Value Date   LDLCALC 97 07/18/2022   - Continue pitavastatin 4 mg daily.  She had switched from Crestor to Livalo because of difficulty tolerating this because of joint pain. ?  Zetia.  Diagnoses and all orders for this visit:  Controlled type 2 diabetes mellitus without complication, without long-term current use of insulin (HCC) -     POCT glycosylated  hemoglobin (Hb A1C) -     FARXIGA 10 MG TABS tablet; Take 1 tablet (10 mg total) by mouth daily.  Type 2 diabetes mellitus with hyperglycemia, without long-term current use of insulin (HCC) -     Dulaglutide (TRULICITY) 0.75 MG/0.5ML SOAJ; Inject 0.75 mg into the skin once a week.    DISPOSITION Follow up in clinic in 4  months suggested.   All questions answered and patient verbalized understanding of the plan.  [place] Ilham Roughton, MD [facility] Endocrinology [facility] [address] [place], [address] [name], [place] [address] Phone # [phone]  At least part of this note was generated using voice recognition software. Inadvertent word errors may have occurred, which were not recognized during the proofreading process.

## 2023-03-09 ENCOUNTER — Other Ambulatory Visit: Payer: Self-pay | Admitting: Endocrinology

## 2023-03-09 DIAGNOSIS — Z170 Estrogen receptor positive status [ER+]: Secondary | ICD-10-CM

## 2023-05-01 ENCOUNTER — Other Ambulatory Visit: Payer: Self-pay | Admitting: Orthopedic Surgery

## 2023-05-01 DIAGNOSIS — M545 Low back pain, unspecified: Secondary | ICD-10-CM

## 2023-05-12 ENCOUNTER — Ambulatory Visit
Admission: RE | Admit: 2023-05-12 | Discharge: 2023-05-12 | Disposition: A | Discharge status: Home / Self Care | Source: Ambulatory Visit | Attending: Orthopedic Surgery | Admitting: Orthopedic Surgery

## 2023-05-12 DIAGNOSIS — M545 Low back pain, unspecified: Secondary | ICD-10-CM

## 2023-05-31 ENCOUNTER — Encounter: Payer: Self-pay | Admitting: Podiatry

## 2023-05-31 ENCOUNTER — Ambulatory Visit (INDEPENDENT_AMBULATORY_CARE_PROVIDER_SITE_OTHER): Admitting: Podiatry

## 2023-05-31 DIAGNOSIS — G729 Myopathy, unspecified: Secondary | ICD-10-CM | POA: Insufficient documentation

## 2023-05-31 DIAGNOSIS — K219 Gastro-esophageal reflux disease without esophagitis: Secondary | ICD-10-CM | POA: Insufficient documentation

## 2023-05-31 DIAGNOSIS — K5901 Slow transit constipation: Secondary | ICD-10-CM | POA: Insufficient documentation

## 2023-05-31 DIAGNOSIS — R195 Other fecal abnormalities: Secondary | ICD-10-CM | POA: Insufficient documentation

## 2023-05-31 DIAGNOSIS — B0229 Other postherpetic nervous system involvement: Secondary | ICD-10-CM | POA: Insufficient documentation

## 2023-05-31 DIAGNOSIS — J45909 Unspecified asthma, uncomplicated: Secondary | ICD-10-CM | POA: Insufficient documentation

## 2023-05-31 DIAGNOSIS — M7751 Other enthesopathy of right foot: Secondary | ICD-10-CM

## 2023-05-31 DIAGNOSIS — L600 Ingrowing nail: Secondary | ICD-10-CM | POA: Insufficient documentation

## 2023-05-31 DIAGNOSIS — M25569 Pain in unspecified knee: Secondary | ICD-10-CM | POA: Insufficient documentation

## 2023-05-31 DIAGNOSIS — R609 Edema, unspecified: Secondary | ICD-10-CM | POA: Insufficient documentation

## 2023-05-31 DIAGNOSIS — N951 Menopausal and female climacteric states: Secondary | ICD-10-CM | POA: Insufficient documentation

## 2023-05-31 DIAGNOSIS — I10 Essential (primary) hypertension: Secondary | ICD-10-CM | POA: Insufficient documentation

## 2023-05-31 DIAGNOSIS — M19071 Primary osteoarthritis, right ankle and foot: Secondary | ICD-10-CM | POA: Diagnosis not present

## 2023-05-31 DIAGNOSIS — G5600 Carpal tunnel syndrome, unspecified upper limb: Secondary | ICD-10-CM | POA: Insufficient documentation

## 2023-05-31 DIAGNOSIS — E559 Vitamin D deficiency, unspecified: Secondary | ICD-10-CM | POA: Insufficient documentation

## 2023-05-31 HISTORY — DX: Essential (primary) hypertension: I10

## 2023-05-31 MED ORDER — DEXAMETHASONE SODIUM PHOSPHATE 120 MG/30ML IJ SOLN
2.0000 mg | Freq: Once | INTRAMUSCULAR | Status: AC
Start: 2023-05-31 — End: 2023-05-31
  Administered 2023-05-31: 2 mg via INTRA_ARTICULAR

## 2023-05-31 NOTE — Progress Notes (Signed)
 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  She states it now at this point there is really no change she cannot wear shoes.  Objective: Vitals are stable oriented x 3 pulses are palpable right foot.  Hallux valgus deformity and MRI had confirmed osteoarthritis of the first metatarsophalangeal joint fibular sesamoid.,  Also has pain on palpation overlying the hypertrophic medial condyle left first metatarsophalangeal joint.  He has pain on end range of motion of the first metatarsal phalangeal joint dorsiflexion and plantarflexion.  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  Plan: I will give her an intra-articular injection today with dexamethasone  local anesthetic.  Toller procedure well without complications follow-up with her in about 2 months.

## 2023-06-06 ENCOUNTER — Other Ambulatory Visit: Payer: Self-pay

## 2023-06-06 DIAGNOSIS — E7800 Pure hypercholesterolemia, unspecified: Secondary | ICD-10-CM

## 2023-06-06 MED ORDER — EZETIMIBE 10 MG PO TABS: ORAL_TABLET | ORAL | 1 refills | Status: DC

## 2023-07-03 ENCOUNTER — Other Ambulatory Visit: Payer: Self-pay

## 2023-07-03 DIAGNOSIS — Z170 Estrogen receptor positive status [ER+]: Secondary | ICD-10-CM

## 2023-07-03 MED ORDER — CLONIDINE 0.1 MG/24HR TD PTWK: MEDICATED_PATCH | TRANSDERMAL | 3 refills | Status: DC

## 2023-07-06 ENCOUNTER — Ambulatory Visit: Payer: Self-pay | Admitting: Endocrinology

## 2023-07-06 ENCOUNTER — Encounter: Payer: Self-pay | Admitting: Endocrinology

## 2023-07-06 ENCOUNTER — Ambulatory Visit (INDEPENDENT_AMBULATORY_CARE_PROVIDER_SITE_OTHER): Payer: Medicare Other | Admitting: Endocrinology

## 2023-07-06 VITALS — BP 128/70 | HR 66 | Resp 16 | Ht 62.0 in | Wt 174.8 lb

## 2023-07-06 DIAGNOSIS — E782 Mixed hyperlipidemia: Secondary | ICD-10-CM | POA: Diagnosis not present

## 2023-07-06 DIAGNOSIS — E119 Type 2 diabetes mellitus without complications: Secondary | ICD-10-CM | POA: Diagnosis not present

## 2023-07-06 DIAGNOSIS — Z7985 Long-term (current) use of injectable non-insulin antidiabetic drugs: Secondary | ICD-10-CM

## 2023-07-06 DIAGNOSIS — Z7984 Long term (current) use of oral hypoglycemic drugs: Secondary | ICD-10-CM

## 2023-07-06 LAB — POCT GLYCOSYLATED HEMOGLOBIN (HGB A1C): Hemoglobin A1C: 6.2 % — AB (ref 4.0–5.6)

## 2023-07-06 NOTE — Progress Notes (Signed)
 Outpatient Endocrinology Note [name], MD  07/06/23  Patient's Name: Amanda [name]    DOB: 01/27/1946    MRN: [id number]                                                    REASON OF VISIT: Follow up of type 2 diabetes mellitus  PCP: Rae Bugler, MD  HISTORY OF PRESENT ILLNESS:   [name] is a 78 y.o. old female with past medical history listed below, is here for follow up for type 2 diabetes mellitus.   Pertinent Diabetes History: Patient was diagnosed with type 2 diabetes mellitus around 2007.  She has controlled type 2 diabetes mellitus.  Chronic Diabetes Complications : Retinopathy: no. Last ophthalmology exam was done on annually, following with ophthalmology regularly.  Nephropathy: no, on ACE/ARB / losartan Peripheral neuropathy: no Coronary artery disease: no Stroke: no  Relevant comorbidities and cardiovascular risk factors: Obesity: yes Body mass index is 31.97 kg/m.  Hypertension: Yes  Hyperlipidemia : Yes, on statin   Current / Home Diabetic regimen includes: Trulicity  0.75 mg weekly. Farxiga  10mg  daily.   Prior diabetic medications: Glipizide .  Invokana .  Victoza .  She had nausea and GI upset with higher dose of Trulicity  in the past.  Metformin, had intolerance Paz Bott..  Glycemic data:   One Touch ultra 2.  Glucometer data downloaded May 16 to May 30 , 2025.  Checking blood sugar occasionally.  Fasting blood sugar 103, 120, blood sugar after breakfast 183.  Hypoglycemia: Patient has no hypoglycemic episodes. Patient has hypoglycemia awareness.  Factors modifying glucose control: 1.  Diabetic diet assessment: 3 meals a day.  2.  Staying active or exercising: Walking on treadmill 1-2 times per week.  3.  Medication compliance: compliant all of the time.  HYPERCALCEMIA: She has had variably high normal calcium  levels since 6/19. Not on any hydrochlorothiazide.  PTH not suppressed  Interval history  Hemoglobin A1c 6.2%.  Diabetes regimen  as reviewed above.  She denies numbness and ting of the feet.  No vision problem.  No other complaints today.  REVIEW OF SYSTEMS As per history of present illness.   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   Hot flashes     PAST SURGICAL HISTORY: Past Surgical History:  Procedure Laterality Date   ABDOMINAL HYSTERECTOMY     APPENDECTOMY     COLONOSCOPY     ESOPHAGOGASTRODUODENOSCOPY     EYE SURGERY Bilateral    Cataracts removed   JOINT REPLACEMENT     RADIOACTIVE SEED GUIDED PARTIAL MASTECTOMY WITH AXILLARY SENTINEL LYMPH NODE BIOPSY Right 04/16/2015   Procedure: RADIOACTIVE SEED GUIDED PARTIAL MASTECTOMY WITH AXILLARY SENTINEL LYMPH NODE BIOPSY;  Surgeon: Juanita Norlander, MD;  Location: [facility];  Service: General;  Laterality: Right;   SHOULDER SURGERY Right    TONSILLECTOMY     TOTAL KNEE ARTHROPLASTY Right 07/18/2013   DR GRAVES   TOTAL KNEE ARTHROPLASTY Right 07/18/2013  Procedure: TOTAL KNEE ARTHROPLASTY;  Surgeon: Boston Byers, MD;  Location: [facility];  Service: Orthopedics;  Laterality: Right;   TOTAL KNEE ARTHROPLASTY Left 06/15/2017   Procedure: LEFT TOTAL KNEE ARTHROPLASTY;  Surgeon: Neil Balls, MD;  Location: [facility];  Service: Orthopedics;  Laterality: Left;    ALLERGIES: Allergies  Allergen Reactions   Statins Other (See Comments)    Muscle pain. Pt able to take Crestor but not everyday.   Ezetimibe      Other Reaction(s): intolerant   Jardiance  [Empagliflozin ] Nausea Only   Lisinopril Cough   Losartan Potassium Diarrhea   Metformin     Other Reaction(s): nausea/diarrhea   Pioglitazone Swelling   Meperidine  Hcl  Rash    FAMILY HISTORY:  History reviewed. No pertinent family history.  SOCIAL HISTORY: Social History   Socioeconomic History   Marital status: Divorced    Spouse name: Not on file   Number of children: Not on file   Years of education: Not on file   Highest education level: Not on file  Occupational History   Not on file  Tobacco Use   Smoking status: Never   Smokeless tobacco: Never  Vaping Use   Vaping status: Never Used  Substance and Sexual Activity   Alcohol use: Yes    Comment: socially, occ weekends   Drug use: No   Sexual activity: Not on file  Other Topics Concern   Not on file  Social History Narrative   Not on file   Social Drivers of Health   Financial Resource Strain: Not on file  Food Insecurity: Not on file  Transportation Needs: Not on file  Physical Activity: Not on file  Stress: Not on file  Social Connections: Not on file    MEDICATIONS:  Current Outpatient Medications  Medication Sig Dispense Refill   albuterol  (VENTOLIN  HFA) 108 (90 Base) MCG/ACT inhaler 2 puff as needed for wheezing Inhalation every 4-6 hrs     Blood Glucose Monitoring Suppl (ONE TOUCH ULTRA 2) w/Device KIT 1 each by Does not apply route daily. Use daily to check blood sugar. 1 each 0   Cholecalciferol (VITAMIN D3) 2000 units TABS Take 6,000 Units by mouth daily.      cloNIDine  (CATAPRES  - DOSED IN MG/24 HR) 0.1 mg/24hr patch APPLY 1 PATCH TOPICALLY TO THE SKIN EVERY WEEK 4 patch 3   Dulaglutide  (TRULICITY ) 0.75 MG/0.5ML SOAJ Inject 0.75 mg into the skin once a week. (Patient not taking: Reported on 07/06/2023) 6 mL 3   Ergocalciferol  50 MCG (2000 UT) CAPS 3 capsules Orally once a day     FARXIGA  10 MG TABS tablet Take 1 tablet (10 mg total) by mouth daily. 90 tablet 3   FLAREX 0.1 % ophthalmic suspension      glucose blood test strip Use Onetouch Ultra test strips as instructed to check blood sugar once daily. 100 each 2   Pitavastatin  Calcium  4 MG TABS TAKE 1 TABLET(4  MG) BY MOUTH DAILY 90 tablet 4   tiZANidine  (ZANAFLEX ) 2 MG tablet Take 2 mg by mouth every 8 (eight) hours as needed.     No current facility-administered medications for this visit.    PHYSICAL EXAM: Vitals:   07/06/23 0939  BP: 128/70  Pulse: 66  Resp: 16  SpO2: 97%  Weight: 174 lb 12.8 oz (79.3 kg)  Height: 5\' 2"  (1.575 m)    Body mass index is 31.97 kg/m.  Wt Readings from Last 3 Encounters:  07/06/23 174 lb 12.8  oz (79.3 kg)  03/08/23 174 lb 6.4 oz (79.1 kg)  12/05/22 180 lb 6.4 oz (81.8 kg)    General: Well developed, well nourished female in no apparent distress.  HEENT: AT/[place], no external lesions.  Eyes: Conjunctiva clear and no icterus. Neck: Neck supple  Lungs: Respirations not labored Neurologic: Alert, oriented, normal speech Extremities / Skin: Dry.   Psychiatric: Does not appear depressed or anxious  Diabetic Foot Exam - Simple   No data filed     LABS Reviewed Lab Results  Component Value Date   HGBA1C 6.2 (A) 07/06/2023   HGBA1C 6.4 (A) 03/08/2023   HGBA1C 5.9 (A) 12/05/2022   Lab Results  Component Value Date   FRUCTOSAMINE 251 06/14/2015   Lab Results  Component Value Date   CHOL 189 07/18/2022   HDL 76.40 07/18/2022   LDLCALC 97 07/18/2022   LDLDIRECT 120.1 09/30/2013   TRIG 78.0 07/18/2022   CHOLHDL 2 07/18/2022   Lab Results  Component Value Date   MICRALBCREAT 1.3 07/18/2022   MICRALBCREAT 1.1 07/15/2021   Lab Results  Component Value Date   CREATININE 0.85 07/18/2022   Lab Results  Component Value Date   GFR 66.24 07/18/2022    ASSESSMENT / PLAN  1. Controlled type 2 diabetes mellitus without complication, without long-term current use of insulin  (HCC)   2. Mixed hyperlipidemia    Diabetes Mellitus type 2, complicated by no known complications. - Diabetic status / severity: Controlled.  Lab Results  Component Value Date   HGBA1C 6.2 (A) 07/06/2023    - Hemoglobin A1c goal : <7%  - Medications: No change.   She had GI intolerance with higher dose of Trulicity  in the past.  I) continue Trulicity  0.75 mg weekly. II) continue Farxiga  10 mg daily.  - Home glucose testing: Few times a week at different times. - Discussed/ Gave Hypoglycemia treatment plan.  # Consult : not required at this time.   # Annual urine for microalbuminuria/ creatinine ratio, no microalbuminuria currently. Last  Lab Results  Component Value Date   MICRALBCREAT 1.3 07/18/2022    # Foot check nightly.  # Annual dilated diabetic eye exams.   - Diet: Make healthy diabetic food choices - Life style / activity / exercise: Discussed.  2. Blood pressure  -  BP Readings from Last 1 Encounters:  07/06/23 128/70    - Control is in target. Mildly high today.  - No change in current plans.  3. Lipid status / Hyperlipidemia - Last  Lab Results  Component Value Date   LDLCALC 97 07/18/2022   - Continue pitavastatin  4 mg daily.  She had switched from Crestor to Livalo  because of difficulty tolerating this because of joint pain. ?  Zetia .  Diagnoses and all orders for this visit:  Controlled type 2 diabetes mellitus without complication, without long-term current use of insulin  (HCC) -     POCT glycosylated hemoglobin (Hb A1C) -     Basic Metabolic Panel Without GFR -     Microalbumin / creatinine urine ratio -     Lipid panel  Mixed hyperlipidemia -     Lipid panel   Labs today as ordered above.  DISPOSITION Follow up in clinic in 6  months suggested.   All questions answered and patient verbalized understanding of the plan.  [place] Shanikia Kernodle, MD [facility] Endocrinology [facility] [address] [place], [address] [place], [place] [address] Phone # [phone]  At least part of this note  was generated using voice recognition software. Inadvertent word errors may have occurred, which were not recognized during the proofreading process.

## 2023-07-07 LAB — BASIC METABOLIC PANEL WITHOUT GFR
BUN: 16 mg/dL (ref 7–25)
CO2: 28 mmol/L (ref 20–32)
Calcium: 10.9 mg/dL — ABNORMAL HIGH (ref 8.6–10.4)
Chloride: 108 mmol/L (ref 98–110)
Creat: 0.82 mg/dL (ref 0.60–1.00)
Glucose, Bld: 103 mg/dL — ABNORMAL HIGH (ref 65–99)
Potassium: 5.1 mmol/L (ref 3.5–5.3)
Sodium: 143 mmol/L (ref 135–146)

## 2023-07-07 LAB — MICROALBUMIN / CREATININE URINE RATIO
Creatinine, Urine: 64 mg/dL (ref 20–275)
Microalb Creat Ratio: 6 mg/g{creat} (ref <30)
Microalb, Ur: 0.4 mg/dL

## 2023-07-07 LAB — LIPID PANEL
Cholesterol: 259 mg/dL — ABNORMAL HIGH (ref <200)
HDL: 89 mg/dL (ref >=50)
LDL Cholesterol (Calc): 149 mg/dL — ABNORMAL HIGH
Non-HDL Cholesterol (Calc): 170 mg/dL — ABNORMAL HIGH (ref <130)
Total CHOL/HDL Ratio: 2.9 (calc) (ref <5.0)
Triglycerides: 97 mg/dL (ref <150)

## 2023-08-30 ENCOUNTER — Ambulatory Visit: Admitting: Podiatry

## 2023-10-09 ENCOUNTER — Other Ambulatory Visit: Payer: Self-pay | Admitting: Endocrinology

## 2023-10-09 DIAGNOSIS — C50411 Malignant neoplasm of upper-outer quadrant of right female breast: Secondary | ICD-10-CM

## 2024-01-10 ENCOUNTER — Ambulatory Visit: Admitting: Endocrinology

## 2024-01-27 ENCOUNTER — Other Ambulatory Visit: Payer: Self-pay | Admitting: Endocrinology

## 2024-01-27 DIAGNOSIS — E7800 Pure hypercholesterolemia, unspecified: Secondary | ICD-10-CM

## 2024-02-01 ENCOUNTER — Other Ambulatory Visit: Payer: Self-pay | Admitting: Endocrinology

## 2024-02-01 DIAGNOSIS — Z170 Estrogen receptor positive status [ER+]: Secondary | ICD-10-CM

## 2024-02-04 MED ORDER — CLONIDINE 0.1 MG/24HR TD PTWK: MEDICATED_PATCH | TRANSDERMAL | 3 refills | Status: AC

## 2024-02-04 NOTE — Addendum Note (Signed)
 Addended by: Dahir Ayer on: 02/04/2024 03:22 PM   Modules accepted: Orders

## 2024-02-04 NOTE — Telephone Encounter (Signed)
 Thank you I renewed for now.  Discussed with patient in the follow-up visit regarding clonidine  refills from primary care provider.

## 2024-02-21 ENCOUNTER — Other Ambulatory Visit: Payer: Self-pay | Admitting: Endocrinology

## 2024-02-21 DIAGNOSIS — E7800 Pure hypercholesterolemia, unspecified: Secondary | ICD-10-CM

## 2024-03-05 ENCOUNTER — Ambulatory Visit: Admitting: Endocrinology

## 2024-03-06 ENCOUNTER — Encounter: Payer: Self-pay | Admitting: Endocrinology

## 2024-03-06 ENCOUNTER — Ambulatory Visit: Payer: Self-pay | Admitting: Endocrinology

## 2024-03-06 ENCOUNTER — Ambulatory Visit: Admitting: Endocrinology

## 2024-03-06 ENCOUNTER — Other Ambulatory Visit

## 2024-03-06 VITALS — BP 140/62 | HR 64 | Ht 63.0 in | Wt 163.8 lb

## 2024-03-06 DIAGNOSIS — E119 Type 2 diabetes mellitus without complications: Secondary | ICD-10-CM

## 2024-03-06 DIAGNOSIS — E782 Mixed hyperlipidemia: Secondary | ICD-10-CM

## 2024-03-06 LAB — POCT GLYCOSYLATED HEMOGLOBIN (HGB A1C): Hemoglobin A1C: 6.2 % — AB (ref 4.0–5.6)

## 2024-03-06 MED ORDER — FARXIGA 10 MG PO TABS: 10.0000 mg | ORAL_TABLET | Freq: Every day | ORAL | 3 refills | Status: AC

## 2024-03-06 MED ORDER — TRULICITY 0.75 MG/0.5ML ~~LOC~~ SOAJ: 0.7500 mg | SUBCUTANEOUS | 3 refills | Status: AC

## 2024-03-06 NOTE — Progress Notes (Signed)
 "  Outpatient Endocrinology Note Amanda Klipfel, MD  03/06/24  Patient's Name: Amanda [name]    DOB: [birth year]    MRN: [id number]                                                    REASON OF VISIT: Follow up of type 2 diabetes mellitus  PCP: Seabron Lenis, MD  HISTORY OF PRESENT ILLNESS:   Amanda [name] is a 79 y.o. old female with past medical history listed below, is here for follow up for type 2 diabetes mellitus.   Pertinent Diabetes History: Patient was diagnosed with type 2 diabetes mellitus around 2007.  She has controlled type 2 diabetes mellitus.  Chronic Diabetes Complications : Retinopathy: no. Last ophthalmology exam was done on annually, following with ophthalmology regularly.  Nephropathy: no, on ACE/ARB / losartan Peripheral neuropathy: no Coronary artery disease: no Stroke: no  Relevant comorbidities and cardiovascular risk factors: Obesity: yes Body mass index is 29.02 kg/m.  Hypertension: Yes  Hyperlipidemia : Yes, on statin   Current / Home Diabetic regimen includes: Trulicity  0.75 mg weekly. Farxiga  10mg  daily.   Prior diabetic medications: Glipizide .  Invokana .  Victoza .  She had nausea and GI upset with higher dose of Trulicity  in the past.  Metformin, had intolerance Amanda [name]..  Glycemic data:   One Touch ultra 2.  Glucometer data downloaded December 25 to February 27, 2024.  Average blood sugar 112.  Checking at different times of the day, mostly acceptable blood sugar, few times a week.  Hypoglycemia: Patient has no hypoglycemic episodes. Patient has hypoglycemia awareness.  Factors modifying glucose control: 1.  Diabetic diet assessment: 3 meals a day.  2.  Staying active or exercising: Walking on treadmill 1-2 times per week.  3.  Medication compliance: compliant all of the time.  HYPERCALCEMIA: She has had variably high normal calcium  levels since 6/19. Not on any hydrochlorothiazide.  PTH not suppressed  Interval history  Hemoglobin A1c  6.2%.  Diabetes regimen as reviewed and noted above.  Glucometer data is reviewed and noted above, mostly acceptable blood sugar.  She denies numbness and ting of the feet.  No vision problem.  No other complaints today.  REVIEW OF SYSTEMS As per history of present illness.   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   Hot flashes     PAST SURGICAL HISTORY: Past Surgical History:  Procedure Laterality Date   ABDOMINAL HYSTERECTOMY     APPENDECTOMY     COLONOSCOPY     ESOPHAGOGASTRODUODENOSCOPY     EYE SURGERY Bilateral    Cataracts removed   JOINT REPLACEMENT     RADIOACTIVE SEED GUIDED PARTIAL MASTECTOMY WITH AXILLARY SENTINEL LYMPH NODE BIOPSY Right 04/16/2015   Procedure: RADIOACTIVE SEED GUIDED PARTIAL MASTECTOMY WITH AXILLARY SENTINEL LYMPH NODE BIOPSY;  Surgeon: Lenis Angle, MD;  Location: [facility];  Service: General;  Laterality: Right;   SHOULDER SURGERY Right    TONSILLECTOMY  TOTAL KNEE ARTHROPLASTY Right 07/18/2013   DR GRAVES   TOTAL KNEE ARTHROPLASTY Right 07/18/2013   Procedure: TOTAL KNEE ARTHROPLASTY;  Surgeon: Norleen LITTIE Gavel, MD;  Location: [facility];  Service: Orthopedics;  Laterality: Right;   TOTAL KNEE ARTHROPLASTY Left 06/15/2017   Procedure: LEFT TOTAL KNEE ARTHROPLASTY;  Surgeon: Gavel Norleen, MD;  Location: [facility];  Service: Orthopedics;  Laterality: Left;    ALLERGIES: Allergies  Allergen Reactions   Statins Other (See Comments)    Muscle pain. Pt able to take Crestor but not everyday.   Ezetimibe      Other Reaction(s): intolerant   Jardiance  [Empagliflozin ] Nausea Only   Lisinopril Cough   Losartan  Potassium Diarrhea   Metformin     Other Reaction(s): nausea/diarrhea   Pioglitazone Swelling   Meperidine  Hcl Rash    FAMILY HISTORY:  History reviewed. No pertinent family history.  SOCIAL HISTORY: Social History   Socioeconomic History   Marital status: Divorced    Spouse name: Not on file   Number of children: Not on file   Years of education: Not on file   Highest education level: Not on file  Occupational History   Not on file  Tobacco Use   Smoking status: Never   Smokeless tobacco: Never  Vaping Use   Vaping status: Never Used  Substance and Sexual Activity   Alcohol use: Yes    Comment: socially, occ weekends   Drug use: No   Sexual activity: Not on file  Other Topics Concern   Not on file  Social History Narrative   Not on file   Social Drivers of Health   Tobacco Use: Low Risk (03/06/2024)   Patient History    Smoking Tobacco Use: Never    Smokeless Tobacco Use: Never    Passive Exposure: Not on file  Financial Resource Strain: Not on file  Food Insecurity: Not on file  Transportation Needs: Not on file  Physical Activity: Not on file  Stress: Not on file  Social Connections: Not on file  Depression (EYV7-0): Not on file  Alcohol Screen: Not on file  Housing: Not on file  Utilities: Not on file  Health Literacy: Not on file    MEDICATIONS:  Current Outpatient Medications  Medication Sig Dispense Refill   albuterol  (VENTOLIN  HFA) 108 (90 Base) MCG/ACT inhaler 2 puff as needed for wheezing Inhalation every 4-6 hrs     Blood Glucose Monitoring Suppl (ONE TOUCH ULTRA 2) w/Device KIT 1 each by Does not apply route daily. Use daily to check blood sugar. 1 each 0   Cholecalciferol (VITAMIN D3) 2000 units TABS Take 6,000 Units by mouth daily.      cloNIDine  (CATAPRES  - DOSED IN MG/24 HR) 0.1 mg/24hr patch APPLY 1 PATCH TOPICALLY TO THE SKIN EVERY WEEK 4 patch 3   Ergocalciferol  50 MCG (2000 UT) CAPS 3 capsules Orally once a day     ezetimibe  (ZETIA )  10 MG tablet TAKE 1 TABLET(10 MG) BY MOUTH DAILY 90 tablet 1   FLAREX 0.1 % ophthalmic suspension      glucose blood test strip Use Onetouch Ultra test strips as instructed to check blood sugar once daily. 100 each 2   Pitavastatin  Calcium  4 MG TABS TAKE 1 TABLET(4 MG) BY MOUTH DAILY 90 tablet 4   tiZANidine  (ZANAFLEX ) 2 MG tablet Take 2 mg by mouth every 8 (eight) hours as needed.     Dulaglutide  (TRULICITY ) 0.75 MG/0.5ML SOAJ Inject 0.75 mg into the skin once a  week. 6 mL 3   FARXIGA  10 MG TABS tablet Take 1 tablet (10 mg total) by mouth daily. 90 tablet 3   No current facility-administered medications for this visit.    PHYSICAL EXAM: Vitals:   03/06/24 1001  BP: (!) 140/62  Pulse: 64  SpO2: 99%  Weight: 163 lb 12.8 oz (74.3 kg)  Height: 5' 3 (1.6 m)     Body mass index is 29.02 kg/m.  Wt Readings from Last 3 Encounters:  03/06/24 163 lb 12.8 oz (74.3 kg)  07/06/23 174 lb 12.8 oz (79.3 kg)  03/08/23 174 lb 6.4 oz (79.1 kg)    General: Well developed, well nourished female in no apparent distress.  HEENT: AT/[place], no external lesions.  Eyes: Conjunctiva clear and no icterus. Neck: Neck supple  Lungs: Respirations not labored Neurologic: Alert, oriented, normal speech Extremities / Skin: Dry.   Psychiatric: Does not appear depressed or anxious  Diabetic Foot Exam - Simple   Simple Foot Form Diabetic Foot exam was performed with the following findings: Yes 03/06/2024 10:18 AM  Visual Inspection No deformities, no ulcerations, no other skin breakdown bilaterally: Yes Sensation Testing Intact to touch and monofilament testing bilaterally: Yes Pulse Check Posterior Tibialis and Dorsalis pulse intact bilaterally: Yes Comments     LABS Reviewed Lab Results  Component Value Date   HGBA1C 6.2 (A) 03/06/2024   HGBA1C 6.2 (A) 07/06/2023   HGBA1C 6.4 (A) 03/08/2023   Lab Results  Component Value Date   FRUCTOSAMINE 251 06/14/2015   Lab Results  Component Value Date    CHOL 259 (H) 07/06/2023   HDL 89 07/06/2023   LDLCALC 149 (H) 07/06/2023   LDLDIRECT 120.1 09/30/2013   TRIG 97 07/06/2023   CHOLHDL 2.9 07/06/2023   Lab Results  Component Value Date   MICRALBCREAT 6 07/06/2023   Lab Results  Component Value Date   CREATININE 0.82 07/06/2023   Lab Results  Component Value Date   GFR 66.24 07/18/2022    ASSESSMENT / PLAN  1. Controlled type 2 diabetes mellitus without complication, without long-term current use of insulin  (HCC)   2. Mixed hyperlipidemia   3. Type 2 diabetes mellitus without complication, without long-term current use of insulin  (HCC)   4. Hypercalcemia     Diabetes Mellitus type 2, complicated by no known complications. - Diabetic status / severity: Controlled.  Lab Results  Component Value Date   HGBA1C 6.2 (A) 03/06/2024    - Hemoglobin A1c goal : <6.5%  She has controlled type 2 diabetes mellitus.  - Medications: No change.   I) continue Trulicity  0.75 mg weekly. II) continue Farxiga  10 mg daily.  - Home glucose testing: Few times a week at different times.  - Discussed/ Gave Hypoglycemia treatment plan.  # Consult : not required at this time.   # Annual urine for microalbuminuria/ creatinine ratio, no microalbuminuria currently. Last  Lab Results  Component Value Date   MICRALBCREAT 6 07/06/2023    # Foot check nightly.  # Annual dilated diabetic eye exams.   - Diet: Make healthy diabetic food choices - Life style / activity / exercise: Discussed.  2. Blood pressure  -  BP Readings from Last 1 Encounters:  03/06/24 (!) 140/62    - Control is in target. Mildly high today.  - No change in current plans.  3. Lipid status / Hyperlipidemia - Last  Lab Results  Component Value Date   LDLCALC 149 (H) 07/06/2023   - Currently on  pitavastatin  4 mg daily.  She had switched from Crestor to Livalo  because of difficulty tolerating this because of joint pain. ?  Zetia . - Recheck lipid panel  today.  # Occasional mild hypercalcemia - Will check BMP today. - Will continue to monitor.  Diagnoses and all orders for this visit:  Controlled type 2 diabetes mellitus without complication, without long-term current use of insulin  (HCC) -     POCT HgB A1C -     Basic metabolic panel with GFR -     Lipid panel -     Microalbumin / creatinine urine ratio -     FARXIGA  10 MG TABS tablet; Take 1 tablet (10 mg total) by mouth daily.  Mixed hyperlipidemia -     Lipid panel  Type 2 diabetes mellitus without complication, without long-term current use of insulin  (HCC) -     Dulaglutide  (TRULICITY ) 0.75 MG/0.5ML SOAJ; Inject 0.75 mg into the skin once a week.  Hypercalcemia    Labs today as ordered above.  DISPOSITION Follow up in clinic in 6  months suggested.   All questions answered and patient verbalized understanding of the plan.  Amanda Gauer, MD [facility] Endocrinology [facility] [address] [place], [address] [place], [place] [address] Phone # [phone]  At least part of this note was generated using voice recognition software. Inadvertent word errors may have occurred, which were not recognized during the proofreading process. "

## 2024-03-07 LAB — BASIC METABOLIC PANEL WITH GFR
BUN: 15 mg/dL (ref 7–25)
CO2: 29 mmol/L (ref 20–32)
Calcium: 10.3 mg/dL (ref 8.6–10.4)
Chloride: 108 mmol/L (ref 98–110)
Creat: 0.78 mg/dL (ref 0.60–1.00)
Glucose, Bld: 91 mg/dL (ref 65–99)
Potassium: 4.6 mmol/L (ref 3.5–5.3)
Sodium: 142 mmol/L (ref 135–146)
eGFR: 78 mL/min/{1.73_m2}

## 2024-03-07 LAB — MICROALBUMIN / CREATININE URINE RATIO
Creatinine, Urine: 68 mg/dL (ref 20–275)
Microalb Creat Ratio: 7 mg/g{creat}
Microalb, Ur: 0.5 mg/dL

## 2024-03-07 LAB — LIPID PANEL
Cholesterol: 149 mg/dL
HDL: 68 mg/dL
LDL Cholesterol (Calc): 67 mg/dL
Non-HDL Cholesterol (Calc): 81 mg/dL
Total CHOL/HDL Ratio: 2.2 (calc)
Triglycerides: 49 mg/dL

## 2024-09-05 ENCOUNTER — Ambulatory Visit: Admitting: Endocrinology
